# Patient Record
Sex: Female | Born: 1965
Health system: Southern US, Community
[De-identification: ages and names within clinical notes are randomized; demographics above are authoritative.]

## PROBLEM LIST (undated history)

## (undated) DIAGNOSIS — I471 Supraventricular tachycardia, unspecified: Secondary | ICD-10-CM

## (undated) DIAGNOSIS — R51 Headache: Secondary | ICD-10-CM

## (undated) DIAGNOSIS — K8050 Calculus of bile duct without cholangitis or cholecystitis without obstruction: Secondary | ICD-10-CM

## (undated) DIAGNOSIS — K449 Diaphragmatic hernia without obstruction or gangrene: Secondary | ICD-10-CM

## (undated) DIAGNOSIS — F419 Anxiety disorder, unspecified: Secondary | ICD-10-CM

## (undated) DIAGNOSIS — F329 Major depressive disorder, single episode, unspecified: Secondary | ICD-10-CM

## (undated) DIAGNOSIS — M722 Plantar fascial fibromatosis: Secondary | ICD-10-CM

## (undated) DIAGNOSIS — K222 Esophageal obstruction: Secondary | ICD-10-CM

## (undated) DIAGNOSIS — G4730 Sleep apnea, unspecified: Secondary | ICD-10-CM

## (undated) DIAGNOSIS — K8020 Calculus of gallbladder without cholecystitis without obstruction: Secondary | ICD-10-CM

## (undated) DIAGNOSIS — A048 Other specified bacterial intestinal infections: Secondary | ICD-10-CM

## (undated) DIAGNOSIS — G8929 Other chronic pain: Secondary | ICD-10-CM

## (undated) DIAGNOSIS — K2970 Gastritis, unspecified, without bleeding: Secondary | ICD-10-CM

## (undated) DIAGNOSIS — F32A Depression, unspecified: Secondary | ICD-10-CM

## (undated) DIAGNOSIS — K219 Gastro-esophageal reflux disease without esophagitis: Secondary | ICD-10-CM

## (undated) DIAGNOSIS — G43909 Migraine, unspecified, not intractable, without status migrainosus: Secondary | ICD-10-CM

## (undated) DIAGNOSIS — R519 Headache, unspecified: Secondary | ICD-10-CM

## (undated) HISTORY — DX: Gastro-esophageal reflux disease without esophagitis: K21.9

## (undated) HISTORY — DX: Sleep apnea, unspecified: G47.30

## (undated) HISTORY — DX: Plantar fascial fibromatosis: M72.2

## (undated) HISTORY — DX: Other specified bacterial intestinal infections: A04.8

## (undated) HISTORY — DX: Diaphragmatic hernia without obstruction or gangrene: K44.9

## (undated) HISTORY — DX: Headache, unspecified: R51.9

## (undated) HISTORY — PX: DILATION AND CURETTAGE OF UTERUS: SHX78

## (undated) HISTORY — DX: Headache: R51

## (undated) HISTORY — PX: TONSILLECTOMY: SUR1361

## (undated) HISTORY — DX: Calculus of bile duct without cholangitis or cholecystitis without obstruction: K80.50

## (undated) HISTORY — DX: Supraventricular tachycardia, unspecified: I47.10

## (undated) HISTORY — DX: Anxiety disorder, unspecified: F41.9

## (undated) HISTORY — DX: Gastritis, unspecified, without bleeding: K29.70

## (undated) HISTORY — DX: Other chronic pain: G89.29

## (undated) HISTORY — DX: Esophageal obstruction: K22.2

---

## 1997-06-01 ENCOUNTER — Ambulatory Visit (HOSPITAL_COMMUNITY): Admission: RE | Admit: 1997-06-01 | Discharge: 1997-06-01 | Payer: Self-pay | Admitting: Obstetrics & Gynecology

## 1997-08-12 ENCOUNTER — Encounter: Admission: RE | Admit: 1997-08-12 | Discharge: 1997-08-12 | Payer: Self-pay | Admitting: Family Medicine

## 1997-08-17 ENCOUNTER — Inpatient Hospital Stay (HOSPITAL_COMMUNITY): Admission: AD | Admit: 1997-08-17 | Discharge: 1997-08-17 | Payer: Self-pay | Admitting: Obstetrics

## 1997-08-27 ENCOUNTER — Encounter: Admission: RE | Admit: 1997-08-27 | Discharge: 1997-08-27 | Payer: Self-pay | Admitting: Family Medicine

## 1997-09-15 ENCOUNTER — Encounter: Admission: RE | Admit: 1997-09-15 | Discharge: 1997-09-15 | Payer: Self-pay | Admitting: Family Medicine

## 1997-10-16 ENCOUNTER — Ambulatory Visit (HOSPITAL_COMMUNITY): Admission: RE | Admit: 1997-10-16 | Discharge: 1997-10-16 | Payer: Self-pay | Admitting: Obstetrics

## 1997-10-20 ENCOUNTER — Encounter: Admission: RE | Admit: 1997-10-20 | Discharge: 1997-10-20 | Payer: Self-pay | Admitting: Sports Medicine

## 1997-11-05 ENCOUNTER — Inpatient Hospital Stay (HOSPITAL_COMMUNITY): Admission: AD | Admit: 1997-11-05 | Discharge: 1997-11-05 | Payer: Self-pay | Admitting: Obstetrics & Gynecology

## 1997-11-19 ENCOUNTER — Encounter: Admission: RE | Admit: 1997-11-19 | Discharge: 1997-11-19 | Payer: Self-pay | Admitting: Family Medicine

## 1997-12-21 ENCOUNTER — Encounter: Admission: RE | Admit: 1997-12-21 | Discharge: 1997-12-21 | Payer: Self-pay | Admitting: Family Medicine

## 1998-01-13 ENCOUNTER — Ambulatory Visit (HOSPITAL_COMMUNITY): Admission: RE | Admit: 1998-01-13 | Discharge: 1998-01-13 | Payer: Self-pay | Admitting: Obstetrics

## 1998-01-19 ENCOUNTER — Encounter: Admission: RE | Admit: 1998-01-19 | Discharge: 1998-01-19 | Payer: Self-pay | Admitting: Sports Medicine

## 1998-01-28 ENCOUNTER — Inpatient Hospital Stay (HOSPITAL_COMMUNITY): Admission: AD | Admit: 1998-01-28 | Discharge: 1998-01-28 | Payer: Self-pay | Admitting: Obstetrics & Gynecology

## 1998-02-03 ENCOUNTER — Encounter: Admission: RE | Admit: 1998-02-03 | Discharge: 1998-02-03 | Payer: Self-pay | Admitting: Family Medicine

## 1998-03-05 ENCOUNTER — Encounter: Admission: RE | Admit: 1998-03-05 | Discharge: 1998-03-05 | Payer: Self-pay | Admitting: Family Medicine

## 1998-03-29 ENCOUNTER — Encounter: Admission: RE | Admit: 1998-03-29 | Discharge: 1998-03-29 | Payer: Self-pay | Admitting: Sports Medicine

## 1998-03-30 ENCOUNTER — Inpatient Hospital Stay (HOSPITAL_COMMUNITY): Admission: RE | Admit: 1998-03-30 | Discharge: 1998-04-02 | Payer: Self-pay

## 1998-04-03 ENCOUNTER — Encounter (HOSPITAL_COMMUNITY): Admission: RE | Admit: 1998-04-03 | Discharge: 1998-07-02 | Payer: Self-pay | Admitting: Family Medicine

## 1998-04-05 ENCOUNTER — Encounter: Admission: RE | Admit: 1998-04-05 | Discharge: 1998-04-05 | Payer: Self-pay | Admitting: Sports Medicine

## 1998-04-07 ENCOUNTER — Encounter: Admission: RE | Admit: 1998-04-07 | Discharge: 1998-04-07 | Payer: Self-pay | Admitting: Family Medicine

## 2000-10-03 ENCOUNTER — Encounter: Payer: Self-pay | Admitting: Obstetrics

## 2000-10-03 ENCOUNTER — Inpatient Hospital Stay (HOSPITAL_COMMUNITY): Admission: AD | Admit: 2000-10-03 | Discharge: 2000-10-03 | Payer: Self-pay | Admitting: Obstetrics

## 2000-10-07 ENCOUNTER — Inpatient Hospital Stay (HOSPITAL_COMMUNITY): Admission: AD | Admit: 2000-10-07 | Discharge: 2000-10-07 | Payer: Self-pay | Admitting: Obstetrics & Gynecology

## 2000-10-10 ENCOUNTER — Inpatient Hospital Stay (HOSPITAL_COMMUNITY): Admission: AD | Admit: 2000-10-10 | Discharge: 2000-10-10 | Payer: Self-pay | Admitting: Obstetrics

## 2000-10-18 ENCOUNTER — Inpatient Hospital Stay (HOSPITAL_COMMUNITY): Admission: AD | Admit: 2000-10-18 | Discharge: 2000-10-18 | Payer: Self-pay | Admitting: Obstetrics & Gynecology

## 2000-10-23 ENCOUNTER — Inpatient Hospital Stay (HOSPITAL_COMMUNITY): Admission: AD | Admit: 2000-10-23 | Discharge: 2000-10-23 | Payer: Self-pay | Admitting: Obstetrics and Gynecology

## 2000-10-30 ENCOUNTER — Inpatient Hospital Stay (HOSPITAL_COMMUNITY): Admission: AD | Admit: 2000-10-30 | Discharge: 2000-10-30 | Payer: Self-pay | Admitting: Obstetrics and Gynecology

## 2001-01-28 ENCOUNTER — Encounter: Admission: RE | Admit: 2001-01-28 | Discharge: 2001-01-28 | Payer: Self-pay | Admitting: Family Medicine

## 2001-02-22 ENCOUNTER — Encounter: Admission: RE | Admit: 2001-02-22 | Discharge: 2001-02-22 | Payer: Self-pay | Admitting: Obstetrics & Gynecology

## 2001-02-25 ENCOUNTER — Encounter: Admission: RE | Admit: 2001-02-25 | Discharge: 2001-02-25 | Payer: Self-pay | Admitting: Family Medicine

## 2001-02-25 ENCOUNTER — Other Ambulatory Visit: Admission: RE | Admit: 2001-02-25 | Discharge: 2001-02-25 | Payer: Self-pay | Admitting: *Deleted

## 2001-03-14 ENCOUNTER — Encounter: Admission: RE | Admit: 2001-03-14 | Discharge: 2001-03-14 | Payer: Self-pay | Admitting: Family Medicine

## 2001-06-18 ENCOUNTER — Emergency Department (HOSPITAL_COMMUNITY): Admission: EM | Admit: 2001-06-18 | Discharge: 2001-06-18 | Payer: Self-pay

## 2001-07-19 ENCOUNTER — Ambulatory Visit (HOSPITAL_COMMUNITY): Admission: RE | Admit: 2001-07-19 | Discharge: 2001-07-19 | Payer: Self-pay | Admitting: Family Medicine

## 2001-07-19 ENCOUNTER — Encounter: Admission: RE | Admit: 2001-07-19 | Discharge: 2001-07-19 | Payer: Self-pay | Admitting: Family Medicine

## 2001-08-09 ENCOUNTER — Emergency Department (HOSPITAL_COMMUNITY): Admission: EM | Admit: 2001-08-09 | Discharge: 2001-08-09 | Payer: Self-pay | Admitting: Emergency Medicine

## 2001-08-09 ENCOUNTER — Encounter: Payer: Self-pay | Admitting: Emergency Medicine

## 2001-08-11 ENCOUNTER — Emergency Department (HOSPITAL_COMMUNITY): Admission: EM | Admit: 2001-08-11 | Discharge: 2001-08-11 | Payer: Self-pay | Admitting: Emergency Medicine

## 2001-08-20 ENCOUNTER — Ambulatory Visit (HOSPITAL_COMMUNITY): Admission: RE | Admit: 2001-08-20 | Discharge: 2001-08-20 | Payer: Self-pay | Admitting: Sports Medicine

## 2001-08-20 ENCOUNTER — Encounter: Admission: RE | Admit: 2001-08-20 | Discharge: 2001-08-20 | Payer: Self-pay | Admitting: Sports Medicine

## 2001-09-04 ENCOUNTER — Encounter: Admission: RE | Admit: 2001-09-04 | Discharge: 2001-09-04 | Payer: Self-pay | Admitting: Sports Medicine

## 2001-09-12 ENCOUNTER — Encounter: Admission: RE | Admit: 2001-09-12 | Discharge: 2001-09-12 | Payer: Self-pay | Admitting: Family Medicine

## 2001-09-18 ENCOUNTER — Encounter: Admission: RE | Admit: 2001-09-18 | Discharge: 2001-09-18 | Payer: Self-pay | Admitting: Family Medicine

## 2001-09-25 ENCOUNTER — Encounter: Admission: RE | Admit: 2001-09-25 | Discharge: 2001-09-25 | Payer: Self-pay | Admitting: Family Medicine

## 2001-09-26 ENCOUNTER — Encounter: Admission: RE | Admit: 2001-09-26 | Discharge: 2001-09-26 | Payer: Self-pay | Admitting: Family Medicine

## 2001-10-16 ENCOUNTER — Encounter: Admission: RE | Admit: 2001-10-16 | Discharge: 2001-10-16 | Payer: Self-pay | Admitting: Family Medicine

## 2001-11-04 ENCOUNTER — Encounter: Admission: RE | Admit: 2001-11-04 | Discharge: 2001-11-04 | Payer: Self-pay | Admitting: Family Medicine

## 2001-11-06 ENCOUNTER — Encounter: Admission: RE | Admit: 2001-11-06 | Discharge: 2001-11-06 | Payer: Self-pay | Admitting: Family Medicine

## 2001-11-13 ENCOUNTER — Encounter: Admission: RE | Admit: 2001-11-13 | Discharge: 2001-11-13 | Payer: Self-pay | Admitting: Family Medicine

## 2001-11-20 ENCOUNTER — Encounter: Admission: RE | Admit: 2001-11-20 | Discharge: 2001-11-20 | Payer: Self-pay | Admitting: Family Medicine

## 2001-12-19 ENCOUNTER — Encounter: Admission: RE | Admit: 2001-12-19 | Discharge: 2001-12-19 | Payer: Self-pay | Admitting: Family Medicine

## 2001-12-25 ENCOUNTER — Encounter: Admission: RE | Admit: 2001-12-25 | Discharge: 2001-12-25 | Payer: Self-pay | Admitting: Family Medicine

## 2002-03-13 ENCOUNTER — Encounter: Admission: RE | Admit: 2002-03-13 | Discharge: 2002-03-13 | Payer: Self-pay | Admitting: Family Medicine

## 2002-05-23 ENCOUNTER — Encounter: Admission: RE | Admit: 2002-05-23 | Discharge: 2002-05-23 | Payer: Self-pay | Admitting: Family Medicine

## 2002-06-05 ENCOUNTER — Encounter: Admission: RE | Admit: 2002-06-05 | Discharge: 2002-06-05 | Payer: Self-pay | Admitting: Family Medicine

## 2002-08-21 ENCOUNTER — Encounter: Admission: RE | Admit: 2002-08-21 | Discharge: 2002-08-21 | Payer: Self-pay | Admitting: Family Medicine

## 2002-08-28 ENCOUNTER — Encounter: Admission: RE | Admit: 2002-08-28 | Discharge: 2002-08-28 | Payer: Self-pay | Admitting: Family Medicine

## 2002-10-30 ENCOUNTER — Encounter: Admission: RE | Admit: 2002-10-30 | Discharge: 2002-10-30 | Payer: Self-pay | Admitting: Family Medicine

## 2002-11-20 ENCOUNTER — Encounter: Admission: RE | Admit: 2002-11-20 | Discharge: 2002-11-20 | Payer: Self-pay | Admitting: Family Medicine

## 2002-12-19 ENCOUNTER — Encounter: Admission: RE | Admit: 2002-12-19 | Discharge: 2002-12-19 | Payer: Self-pay | Admitting: Family Medicine

## 2003-01-26 ENCOUNTER — Encounter: Admission: RE | Admit: 2003-01-26 | Discharge: 2003-01-26 | Payer: Self-pay | Admitting: Sports Medicine

## 2003-02-13 ENCOUNTER — Encounter: Admission: RE | Admit: 2003-02-13 | Discharge: 2003-02-13 | Payer: Self-pay | Admitting: Sports Medicine

## 2003-05-07 ENCOUNTER — Encounter: Admission: RE | Admit: 2003-05-07 | Discharge: 2003-05-07 | Payer: Self-pay | Admitting: Family Medicine

## 2003-08-11 ENCOUNTER — Encounter: Admission: RE | Admit: 2003-08-11 | Discharge: 2003-08-11 | Payer: Self-pay | Admitting: Family Medicine

## 2003-10-30 ENCOUNTER — Ambulatory Visit: Payer: Self-pay | Admitting: Family Medicine

## 2003-11-18 ENCOUNTER — Ambulatory Visit: Payer: Self-pay | Admitting: Family Medicine

## 2003-11-23 ENCOUNTER — Ambulatory Visit: Payer: Self-pay | Admitting: Sports Medicine

## 2003-11-25 ENCOUNTER — Encounter: Admission: RE | Admit: 2003-11-25 | Discharge: 2004-02-23 | Payer: Self-pay | Admitting: Family Medicine

## 2003-12-25 ENCOUNTER — Ambulatory Visit: Payer: Self-pay | Admitting: Family Medicine

## 2004-01-20 ENCOUNTER — Ambulatory Visit: Payer: Self-pay | Admitting: Family Medicine

## 2004-04-08 ENCOUNTER — Ambulatory Visit: Payer: Self-pay | Admitting: Family Medicine

## 2004-07-01 ENCOUNTER — Ambulatory Visit: Payer: Self-pay | Admitting: Family Medicine

## 2004-09-20 ENCOUNTER — Ambulatory Visit: Payer: Self-pay | Admitting: Family Medicine

## 2004-11-17 ENCOUNTER — Ambulatory Visit: Payer: Self-pay | Admitting: Family Medicine

## 2004-12-02 ENCOUNTER — Ambulatory Visit: Payer: Self-pay | Admitting: Family Medicine

## 2005-02-28 ENCOUNTER — Ambulatory Visit: Payer: Self-pay | Admitting: Family Medicine

## 2005-03-05 ENCOUNTER — Encounter (INDEPENDENT_AMBULATORY_CARE_PROVIDER_SITE_OTHER): Payer: Self-pay | Admitting: *Deleted

## 2005-03-05 LAB — CONVERTED CEMR LAB

## 2005-03-08 ENCOUNTER — Ambulatory Visit: Payer: Self-pay | Admitting: Family Medicine

## 2005-03-08 ENCOUNTER — Other Ambulatory Visit: Admission: RE | Admit: 2005-03-08 | Discharge: 2005-03-08 | Payer: Self-pay | Admitting: Family Medicine

## 2005-03-15 ENCOUNTER — Ambulatory Visit: Payer: Self-pay | Admitting: Family Medicine

## 2005-03-17 ENCOUNTER — Ambulatory Visit: Payer: Self-pay | Admitting: Family Medicine

## 2005-05-18 ENCOUNTER — Ambulatory Visit: Payer: Self-pay | Admitting: Family Medicine

## 2005-08-04 ENCOUNTER — Ambulatory Visit: Payer: Self-pay | Admitting: Family Medicine

## 2006-04-26 DIAGNOSIS — R351 Nocturia: Secondary | ICD-10-CM | POA: Insufficient documentation

## 2006-04-27 ENCOUNTER — Encounter (INDEPENDENT_AMBULATORY_CARE_PROVIDER_SITE_OTHER): Payer: Self-pay | Admitting: *Deleted

## 2006-05-06 ENCOUNTER — Emergency Department (HOSPITAL_COMMUNITY): Admission: EM | Admit: 2006-05-06 | Discharge: 2006-05-06 | Payer: Self-pay | Admitting: Emergency Medicine

## 2006-07-02 ENCOUNTER — Ambulatory Visit: Payer: Self-pay | Admitting: Family Medicine

## 2006-07-02 DIAGNOSIS — N920 Excessive and frequent menstruation with regular cycle: Secondary | ICD-10-CM | POA: Insufficient documentation

## 2006-07-02 LAB — CONVERTED CEMR LAB: Beta hcg, urine, semiquantitative: NEGATIVE

## 2006-07-24 ENCOUNTER — Encounter: Payer: Self-pay | Admitting: Family Medicine

## 2006-07-24 ENCOUNTER — Ambulatory Visit: Payer: Self-pay | Admitting: Family Medicine

## 2006-07-24 LAB — CONVERTED CEMR LAB: Beta hcg, urine, semiquantitative: NEGATIVE

## 2006-07-27 ENCOUNTER — Encounter: Payer: Self-pay | Admitting: Family Medicine

## 2006-08-22 ENCOUNTER — Encounter: Payer: Self-pay | Admitting: *Deleted

## 2007-02-25 ENCOUNTER — Emergency Department (HOSPITAL_COMMUNITY): Admission: EM | Admit: 2007-02-25 | Discharge: 2007-02-25 | Payer: Self-pay | Admitting: Emergency Medicine

## 2007-03-21 ENCOUNTER — Ambulatory Visit: Payer: Self-pay | Admitting: Obstetrics and Gynecology

## 2007-03-26 ENCOUNTER — Ambulatory Visit (HOSPITAL_COMMUNITY): Admission: RE | Admit: 2007-03-26 | Discharge: 2007-03-26 | Payer: Self-pay | Admitting: Obstetrics & Gynecology

## 2007-04-03 ENCOUNTER — Ambulatory Visit (HOSPITAL_COMMUNITY): Admission: RE | Admit: 2007-04-03 | Discharge: 2007-04-03 | Payer: Self-pay | Admitting: Obstetrics and Gynecology

## 2007-04-03 ENCOUNTER — Ambulatory Visit: Payer: Self-pay | Admitting: Obstetrics and Gynecology

## 2007-04-05 ENCOUNTER — Ambulatory Visit: Payer: Self-pay | Admitting: Obstetrics & Gynecology

## 2007-04-17 ENCOUNTER — Ambulatory Visit: Payer: Self-pay | Admitting: Internal Medicine

## 2007-04-17 DIAGNOSIS — R059 Cough, unspecified: Secondary | ICD-10-CM | POA: Insufficient documentation

## 2007-04-17 DIAGNOSIS — J984 Other disorders of lung: Secondary | ICD-10-CM | POA: Insufficient documentation

## 2007-04-17 DIAGNOSIS — R05 Cough: Secondary | ICD-10-CM

## 2007-04-17 DIAGNOSIS — R0789 Other chest pain: Secondary | ICD-10-CM | POA: Insufficient documentation

## 2007-04-22 ENCOUNTER — Ambulatory Visit: Payer: Self-pay | Admitting: Internal Medicine

## 2007-05-03 ENCOUNTER — Ambulatory Visit: Payer: Self-pay

## 2007-05-03 ENCOUNTER — Encounter: Payer: Self-pay | Admitting: Cardiovascular Disease

## 2007-05-03 ENCOUNTER — Ambulatory Visit: Payer: Self-pay | Admitting: Cardiovascular Disease

## 2007-05-03 ENCOUNTER — Ambulatory Visit (HOSPITAL_COMMUNITY): Admission: RE | Admit: 2007-05-03 | Discharge: 2007-05-03 | Payer: Self-pay | Admitting: Obstetrics & Gynecology

## 2007-05-09 ENCOUNTER — Ambulatory Visit: Payer: Self-pay | Admitting: Professional

## 2007-05-17 ENCOUNTER — Ambulatory Visit: Payer: Self-pay | Admitting: Obstetrics & Gynecology

## 2007-05-20 ENCOUNTER — Ambulatory Visit: Payer: Self-pay | Admitting: Internal Medicine

## 2007-06-06 ENCOUNTER — Ambulatory Visit: Payer: Self-pay | Admitting: Obstetrics and Gynecology

## 2007-08-22 ENCOUNTER — Encounter (INDEPENDENT_AMBULATORY_CARE_PROVIDER_SITE_OTHER): Payer: Self-pay | Admitting: Gynecology

## 2007-08-22 ENCOUNTER — Ambulatory Visit: Payer: Self-pay | Admitting: Gynecology

## 2007-09-03 ENCOUNTER — Ambulatory Visit (HOSPITAL_COMMUNITY): Admission: RE | Admit: 2007-09-03 | Discharge: 2007-09-03 | Payer: Self-pay | Admitting: Gynecology

## 2007-11-07 ENCOUNTER — Ambulatory Visit: Payer: Self-pay | Admitting: Obstetrics and Gynecology

## 2007-11-13 ENCOUNTER — Telehealth: Payer: Self-pay | Admitting: *Deleted

## 2007-11-15 ENCOUNTER — Ambulatory Visit: Payer: Self-pay | Admitting: Family Medicine

## 2007-12-17 ENCOUNTER — Encounter (INDEPENDENT_AMBULATORY_CARE_PROVIDER_SITE_OTHER): Payer: Self-pay | Admitting: *Deleted

## 2008-01-28 ENCOUNTER — Ambulatory Visit: Payer: Self-pay | Admitting: Obstetrics & Gynecology

## 2008-04-28 ENCOUNTER — Ambulatory Visit: Payer: Self-pay | Admitting: Internal Medicine

## 2008-04-29 ENCOUNTER — Ambulatory Visit: Payer: Self-pay | Admitting: Internal Medicine

## 2008-06-09 ENCOUNTER — Ambulatory Visit: Payer: Self-pay | Admitting: Internal Medicine

## 2008-06-15 ENCOUNTER — Ambulatory Visit: Payer: Self-pay | Admitting: Family Medicine

## 2008-06-15 ENCOUNTER — Encounter: Payer: Self-pay | Admitting: Family Medicine

## 2008-06-15 DIAGNOSIS — R209 Unspecified disturbances of skin sensation: Secondary | ICD-10-CM | POA: Insufficient documentation

## 2008-06-15 DIAGNOSIS — F321 Major depressive disorder, single episode, moderate: Secondary | ICD-10-CM | POA: Insufficient documentation

## 2008-06-16 ENCOUNTER — Encounter: Payer: Self-pay | Admitting: Family Medicine

## 2008-06-22 ENCOUNTER — Ambulatory Visit: Payer: Self-pay | Admitting: Family Medicine

## 2008-06-22 ENCOUNTER — Telehealth: Payer: Self-pay | Admitting: Family Medicine

## 2008-06-22 DIAGNOSIS — J309 Allergic rhinitis, unspecified: Secondary | ICD-10-CM | POA: Insufficient documentation

## 2008-06-22 DIAGNOSIS — G43909 Migraine, unspecified, not intractable, without status migrainosus: Secondary | ICD-10-CM | POA: Insufficient documentation

## 2008-07-08 ENCOUNTER — Telehealth: Payer: Self-pay | Admitting: Family Medicine

## 2008-07-11 ENCOUNTER — Emergency Department (HOSPITAL_COMMUNITY): Admission: EM | Admit: 2008-07-11 | Discharge: 2008-07-11 | Payer: Self-pay | Admitting: Family Medicine

## 2008-07-14 ENCOUNTER — Telehealth: Payer: Self-pay | Admitting: Family Medicine

## 2008-09-07 ENCOUNTER — Ambulatory Visit (HOSPITAL_COMMUNITY): Admission: RE | Admit: 2008-09-07 | Discharge: 2008-09-07 | Payer: Self-pay | Admitting: Obstetrics & Gynecology

## 2009-02-24 ENCOUNTER — Ambulatory Visit: Payer: Self-pay | Admitting: Family Medicine

## 2009-02-24 ENCOUNTER — Telehealth: Payer: Self-pay | Admitting: Family Medicine

## 2009-05-19 ENCOUNTER — Ambulatory Visit: Payer: Self-pay | Admitting: Family Medicine

## 2009-05-19 DIAGNOSIS — N644 Mastodynia: Secondary | ICD-10-CM | POA: Insufficient documentation

## 2009-05-19 DIAGNOSIS — S139XXA Sprain of joints and ligaments of unspecified parts of neck, initial encounter: Secondary | ICD-10-CM | POA: Insufficient documentation

## 2009-06-11 ENCOUNTER — Ambulatory Visit: Payer: Self-pay | Admitting: Internal Medicine

## 2009-08-01 ENCOUNTER — Emergency Department (HOSPITAL_COMMUNITY): Admission: EM | Admit: 2009-08-01 | Discharge: 2009-08-01 | Payer: Self-pay | Admitting: Emergency Medicine

## 2009-08-08 ENCOUNTER — Emergency Department (HOSPITAL_COMMUNITY): Admission: EM | Admit: 2009-08-08 | Discharge: 2009-08-08 | Payer: Self-pay | Admitting: Emergency Medicine

## 2009-10-05 ENCOUNTER — Encounter: Payer: Self-pay | Admitting: Sports Medicine

## 2009-10-05 ENCOUNTER — Ambulatory Visit: Payer: Self-pay | Admitting: Family Medicine

## 2009-10-21 ENCOUNTER — Ambulatory Visit: Payer: Self-pay | Admitting: Obstetrics and Gynecology

## 2009-10-21 ENCOUNTER — Ambulatory Visit (HOSPITAL_COMMUNITY): Admission: RE | Admit: 2009-10-21 | Discharge: 2009-10-21 | Payer: Self-pay | Admitting: Family Medicine

## 2010-03-20 ENCOUNTER — Encounter: Payer: Self-pay | Admitting: Internal Medicine

## 2010-03-29 NOTE — Assessment & Plan Note (Signed)
Summary: eye infection,tcb   Vital Signs:  Patient profile:   46 year old female Height:      62 inches Weight:      154 pounds BMI:     28.27 Temp:     98.4 degrees F oral BP sitting:   122 / 72  (right arm) Cuff size:   regular  Vitals Entered By: Jimmy Footman, CMA (October 05, 2009 10:33 AM) CC: rt eye infection started this morning Is Patient Diabetic? No Pain Assessment Patient in pain? yes     Location: eye Intensity: 3 Type: stinging   Primary Care Provider:  [facility]  Teaching [facility]  CC:  rt eye infection started this morning.  History of Present Illness: 45 yo contact wearer.  Slept in contacts last night, woke up with red irritated eyes.  Took out lenses but thinks she may have scratched her eye.  No gritty/sandpaper sensation.  Eyes just red and irritated.  Vision normal.  Wearing her glasses.  No drainage other than tears when she rubs eyes.  No fevers/chills.  No ear pain.    Current Medications (verified): 1)  Wellbutrin Sr 150 Mg Xr12h-Tab (Bupropion Hcl) .... Take 1 Tablet By Mouth Once A Day 2)  Amitriptyline Hcl 50 Mg Tabs (Amitriptyline Hcl) .... Take 1/2 Tab By Mouth At Bedtime For 3 Days; Then Increase To 1 Tab By Mouth At Bedtime For Headache Prevention 3)  Imitrex 50 Mg Tabs (Sumatriptan Succinate) .Marland Kitchen.. 1 Tab By Mouth As Needed Headache.  May Repeat Once After 2 Hours If Headache Recurs 4)  Loratadine 10 Mg Tabs (Loratadine) .Marland Kitchen.. 1 Tab By Mouth Daily For Allergies 5)  Flexeril 10 Mg Tabs (Cyclobenzaprine Hcl) .Marland Kitchen.. 1 By Mouth Three Times A Day As Needed 6)  Genteal 0.3 % Soln (Hypromellose) .Marland Kitchen.. 1-2 Drops Q4h in Eye As Needed For Irritation  Allergies (verified): 1)  ! Percocet  Review of Systems       See HPI  Physical Exam  General:  Well-developed,well-nourished,in no acute distress; alert,appropriate and cooperative throughout examination Head:  Normocephalic and atraumatic without obvious abnormalities. No apparent alopecia  or balding. Eyes:  Conjunctiva injected on right, vision grossly normal, tearing but no purulence.  EOMI.  Fluorescein examination unremarkable, no corneal abrasions or ulcerations seen. Ears:  External ear exam shows no significant lesions or deformities.  Neck:  No deformities, masses, or tenderness noted.   Impression & Recommendations:  Problem # 1:  UNSPECIFIED CONJUNCTIVITIS (ICD-372.30) Assessment New Irritative conjunctivitis.  Pt desires rx eyedrops, no need for abx so will rx genteal eye drops as needed.  Advised keep contacts off for 5 d or until irritation resolved.  Wear glasses until then.  If worsening in 5d or purulent drainage then RTC.  Pt does not desire any rx for pain.  The following medications were removed from the medication list:    Ciloxan 0.3 % Soln (Ciprofloxacin hcl) .Marland Kitchen... 1 drop in eye every 2 hours while awake for 2 days then every 4 hours while awake for 5 days disp: 1 bottle (smallest size sufficient)  Orders: [facility]- Est Level  3 (69629)  Complete Medication List: 1)  Wellbutrin Sr 150 Mg Xr12h-tab (Bupropion hcl) .... Take 1 tablet by mouth once a day 2)  Amitriptyline Hcl 50 Mg Tabs (Amitriptyline hcl) .... Take 1/2 tab by mouth at bedtime for 3 days; then increase to 1 tab by mouth at bedtime for headache prevention 3)  Imitrex 50 Mg  Tabs (Sumatriptan succinate) .Marland Kitchen.. 1 tab by mouth as needed headache.  may repeat once after 2 hours if headache recurs 4)  Loratadine 10 Mg Tabs (Loratadine) .Marland Kitchen.. 1 tab by mouth daily for allergies 5)  Flexeril 10 Mg Tabs (Cyclobenzaprine hcl) .Marland Kitchen.. 1 by mouth three times a day as needed 6)  Genteal 0.3 % Soln (Hypromellose) .Marland Kitchen.. 1-2 drops q4h in eye as needed for irritation  Patient Instructions: 1)  There are no abrasions/scratches on your eye. 2)  Keep your contacts off for  ~5 days.  Keep eyes well lubricated. 3)  Come back to see me if not getting better in 5d. 4)  -Dr. Karie Schwalbe. Prescriptions: GENTEAL 0.3 % SOLN  (HYPROMELLOSE) 1-2 drops q4h in eye as needed for irritation  #1 bottle x 0   Entered and Authorized by:   Rodney Langton MD   Signed by:   Rodney Langton MD on 10/05/2009   Method used:   Print then Give to Patient   RxID:   [id number]

## 2010-03-29 NOTE — Letter (Signed)
Summary: Out of Work  [facility]  [address]   [place], [place] [address]   Phone: [phone]  Fax: [phone]    October 05, 2009   Employee:  [name]    To Whom It May Concern:   For Medical reasons, please excuse the above named employee from work for the following dates:  Start:   10/05/09  End:   10/05/09  If you need additional information, please feel free to contact our office.         Sincerely,    Rodney Langton MD
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
## 2010-07-12 NOTE — Procedures (Signed)
[facility]                              EXERCISE TREADMILL   NAME:[name], [name]                        MRN:          [id number]  DATE:05/03/2007                            DOB:          [birth year]    HISTORY:  A 45 year old patient with family history of coronary artery  disease, chest pain, rule out coronary artery disease.   The patient exercised for 7 minutes on standard Bruce protocol exercise  and stopped due to fatigue.  Maximum heart rate is 166.  Peak blood  pressure is 141/88.  Resting EKG showed right atrial enlargement with  stress.  There was no ischemia.   IMPRESSION:  Negative exercise stress test at a reasonable work load.  Apparently the patient has had over a 100 pound weight loss and is  having dyspnea as well.  She was referred for followup echocardiogram  and then will see Dr. Marchelle Gearing back.  There is no evidence of premature  coronary artery disease.     Noralyn Pick. Eden Emms, MD, [facility]  Electronically Signed    PCN/MedQ  DD: 05/03/2007  DT: 05/05/2007  Job #: [id number]   cc:   Kalman Shan, MD
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
## 2010-07-12 NOTE — Group Therapy Note (Signed)
NAMEMarland Kitchen  [name], [name] NO.:  [id number]   MEDICAL RECORD NO.:  [id number]          PATIENT TYPE:  WOC   LOCATION[facility]                   FACILITY:  [facility]   PHYSICIAN:  Ginger Carne, MD DATE OF BIRTH:  December 16, 1965   DATE OF SERVICE:  08/22/2007                                  CLINIC NOTE   Patient is here today for routine gynecologic evaluation.  She has no  specific complaints.  She has an occasional scant vaginal flow.  She is  on Depo Provera 150 mg daily.  Otherwise, the patient was recently  evaluated in March 2009 for lung nodules.  CT scan had been performed  through [facility] and at that time they had decided to sit  tight.  The impression after their evaluation of the patient was to  repeat a chest x-ray in 9 months.  She has also had a cardiac stress  test and I do not have a report of same.   Remainder of her medical history is recorded in the chart.   PHYSICAL EXAMINATION:  VITAL SIGNS:  Blood pressure 110/72, weight 92  pounds, height 62 inches, pulse 88 and regular.  Temperature is 100 but  patient has no symptoms related to the flu or upper respiratory tract  infection.  HEENT:  Grossly normal.  BREASTS:  Without masses, discharge, thickenings or tenderness.  CHEST:  Clear to percussion and auscultation.  CARDIOVASCULAR:  Without murmurs or enlargements, regular rate and  rhythm.  EXTREMITY, LYMPHATIC, SKIN, NEUROLOGICAL, MUSCULOSKELETAL SYSTEMS:  Within normal limits.  ABDOMEN:  Soft without gross hepatosplenomegaly.  PELVIC:  Pap performed.  External genitalia, vulva and vagina normal.  Cervix smooth without erosions or lesions.  Uterus is small, anteverted  and flexed.  Both adnexa palpable, found to be normal.   IMPRESSION:  Normal gynecologic exam.   PLAN:  The patient will receive Depo Provera 150 mg IM today and repeat  every 3 months and also will undergo a screening mammogram.  She was  advised if she does  develop upper respiratory infection symptomatology,  to go to the emergency department at [facility]. [facility] or  [facility].           ______________________________  Ginger Carne, MD     SHB/MEDQ  D:  08/22/2007  T:  08/22/2007  Job:  [id number]

## 2010-07-12 NOTE — Group Therapy Note (Signed)
NAMEMarland Kitchen  MAHROSH, DONNELL NO.:  [id number]   MEDICAL RECORD NO.:  [id number]          PATIENT TYPE:  WOC   [facility]ATION[facility]                   [facility]   PHYSICIAN:  Elsie Lincoln, MD      DATE OF BIRTH:  Jul 16, 1965   DATE OF SERVICE:  04/05/2007                                  CLINIC NOTE   The patient is a 45 year old female who is here for results.  The  patient has chronic nausea and weight loss and was sent to [place] for  evaluation of weight loss and chronic nausea and also some  oligomenorrhea.  The patient is very thin and did have an FSH, TSH, LH  drawn which were normal.  The patient does not appear to be menopausal.  FSH was 9, her TSH showed her to be euthymic.  Her hemoglobin is 15.4.  The patient had transvaginal ultrasound which showed a small simple left  ovarian cyst that needs to be followed up in 6 weeks.  The patient  already has an appointment in March.  It also did show some lung nodules  in the bases so we  repeated a chest CT which showed pulmonary nodules  in the right lung base and lingular subpleural nodule measuring 4.8 mm.  An additional tiny left lung nodule measuring 2.1 mm.  Given that the  patient has lung nodules, weight loss and nausea, I am going to send her  to a pulmonologist so he can evaluate this and do the correct follow-up.  We will go ahead and see her back after her ultrasound to make sure that  the ovarian cyst has resolved.  She has also been started on Depo to  protect her endometrium because she has oligomenorrhea.  She is due for  that shot 12 weeks after her last shot.  We will see her again in March  after the ultrasound.           ______________________________  Elsie Lincoln, MD     KL/MEDQ  D:  04/05/2007  T:  04/06/2007  Job:  [id number]

## 2010-11-17 LAB — POCT URINALYSIS DIP (DEVICE)
Nitrite: NEGATIVE
Specific Gravity, Urine: <=1.005
Urobilinogen, UA: 0.2
pH: 5

## 2010-12-02 LAB — POCT PREGNANCY, URINE: Operator id: 116391

## 2011-02-26 ENCOUNTER — Encounter: Payer: Self-pay | Admitting: Emergency Medicine

## 2011-02-26 ENCOUNTER — Emergency Department (HOSPITAL_COMMUNITY): Payer: Self-pay

## 2011-02-26 ENCOUNTER — Emergency Department (HOSPITAL_COMMUNITY)
Admission: EM | Admit: 2011-02-26 | Discharge: 2011-02-27 | Disposition: A | Discharge status: Home / Self Care | Payer: Self-pay | Attending: Emergency Medicine | Admitting: Emergency Medicine

## 2011-02-26 DIAGNOSIS — R1011 Right upper quadrant pain: Secondary | ICD-10-CM | POA: Insufficient documentation

## 2011-02-26 DIAGNOSIS — K8020 Calculus of gallbladder without cholecystitis without obstruction: Secondary | ICD-10-CM | POA: Insufficient documentation

## 2011-02-26 HISTORY — DX: Depression, unspecified: F32.A

## 2011-02-26 HISTORY — DX: Major depressive disorder, single episode, unspecified: F32.9

## 2011-02-26 LAB — URINALYSIS, ROUTINE W REFLEX MICROSCOPIC
Ketones, ur: NEGATIVE mg/dL
Leukocytes, UA: NEGATIVE
Nitrite: NEGATIVE
Protein, ur: NEGATIVE mg/dL
Urobilinogen, UA: 0.2 mg/dL (ref 0.0–1.0)

## 2011-02-26 LAB — URINE MICROSCOPIC-ADD ON

## 2011-02-26 MED ORDER — SODIUM CHLORIDE 0.9 % IV BOLUS (SEPSIS)
1000.0000 mL | Freq: Once | INTRAVENOUS | Status: AC
  Administered 2011-02-26: 1000 mL via INTRAVENOUS

## 2011-02-26 MED ORDER — ONDANSETRON HCL 4 MG/2ML IJ SOLN
4.0000 mg | Freq: Once | INTRAMUSCULAR | Status: AC
  Administered 2011-02-26: 4 mg via INTRAVENOUS
  Filled 2011-02-26: qty 2

## 2011-02-26 MED ORDER — MORPHINE SULFATE 4 MG/ML IJ SOLN
4.0000 mg | Freq: Once | INTRAMUSCULAR | Status: AC
  Administered 2011-02-26: 4 mg via INTRAVENOUS
  Filled 2011-02-26: qty 1

## 2011-02-26 NOTE — ED Provider Notes (Signed)
History     CSN: [id number]  Arrival date & time 02/26/11  1959   First MD Initiated Contact with Patient 02/26/11 2149      Chief Complaint  Patient presents with  . Flank Pain  . Abdominal Pain  . Nausea     HPI  History provided by the patient patient is a 45 year old female with history of depression who presents with complaints of right upper quadrant nominal pain. Patient reports having similar pains off and on for the past 3-4 months. She reports pain that usually lasts up to an hour at a time and then resolves. Over the past 3 days patient reports having persistent goal right upper quadrant pain radiating around to her back. Around 3 PM today pain began to gradually increase and become much worse. He continues to be persistent. It is associated with nausea. She denies any vomiting, diarrhea, or constipation. She has no fever, chills, sweats. She denies any other significant past medical history. Patient has no history of abdominal surgery.    Past Medical History  Diagnosis Date  . Depression     Past Surgical History  Procedure Date  . Dilation and curettage of uterus   . Tonsillectomy     History reviewed. No pertinent family history.  History  Substance Use Topics  . Smoking status: Not on file  . Smokeless tobacco: Not on file  . Alcohol Use: Yes    OB History    Grav Para Term Preterm Abortions TAB SAB Ect Mult Living                  Review of Systems  Constitutional: Negative for fever and chills.  Respiratory: Negative for cough.   Cardiovascular: Negative for chest pain.  Gastrointestinal: Positive for nausea and abdominal pain. Negative for vomiting, diarrhea and constipation.  All other systems reviewed and are negative.    Allergies  Oxycodone-acetaminophen  Home Medications  No current outpatient prescriptions on file.  BP 136/107  Pulse 90  Temp(Src) 98.9 F (37.2 C) (Oral)  Resp 20  Wt 110 lb 14.3 oz (50.3 kg)  SpO2  100%  Physical Exam  Nursing note and vitals reviewed. Constitutional: She is oriented to person, place, and time. She appears well-developed and well-nourished. No distress.  HENT:  Head: Normocephalic.  Mouth/Throat: Oropharynx is clear and moist.  Neck: Neck supple.  Cardiovascular: Normal rate and regular rhythm.   Pulmonary/Chest: Effort normal and breath sounds normal.  Abdominal: Soft. She exhibits no distension. There is tenderness in the right upper quadrant. There is no rebound, no guarding, no CVA tenderness, no tenderness at McBurney's point and negative Murphy's sign.       Obese  Neurological: She is alert and oriented to person, place, and time.  Skin: Skin is warm and dry. No rash noted.  Psychiatric: She has a normal mood and affect. Her behavior is normal.    ED Course  Procedures (including critical care time)   Labs Reviewed  CBC  DIFFERENTIAL  COMPREHENSIVE METABOLIC PANEL  LIPASE, BLOOD  URINALYSIS, ROUTINE W REFLEX MICROSCOPIC  POCT PREGNANCY, URINE   Results for orders placed during the hospital encounter of 02/26/11  CBC      Component Value Range   WBC 10.8 (*) 4.0 - 10.5 (K/uL)   RBC 4.41  3.87 - 5.11 (MIL/uL)   Hemoglobin 14.3  12.0 - 15.0 (g/dL)   HCT 53.6  64.4 - 03.4 (%)   MCV 95.0  78.0 -  100.0 (fL)   MCH 32.4  26.0 - 34.0 (pg)   MCHC 34.1  30.0 - 36.0 (g/dL)   RDW 40.9  81.1 - 91.4 (%)   Platelets 226  150 - 400 (K/uL)  DIFFERENTIAL      Component Value Range   Neutrophils Relative 57  43 - 77 (%)   Neutro Abs 6.2  1.7 - 7.7 (K/uL)   Lymphocytes Relative 33  12 - 46 (%)   Lymphs Abs 3.5  0.7 - 4.0 (K/uL)   Monocytes Relative 8  3 - 12 (%)   Monocytes Absolute 0.9  0.1 - 1.0 (K/uL)   Eosinophils Relative 2  0 - 5 (%)   Eosinophils Absolute 0.2  0.0 - 0.7 (K/uL)   Basophils Relative 1  0 - 1 (%)   Basophils Absolute 0.1  0.0 - 0.1 (K/uL)  COMPREHENSIVE METABOLIC PANEL      Component Value Range   Sodium 138  135 - 145 (mEq/L)    Potassium 4.0  3.5 - 5.1 (mEq/L)   Chloride 104  96 - 112 (mEq/L)   CO2 27  19 - 32 (mEq/L)   Glucose, Bld 102 (*) 70 - 99 (mg/dL)   BUN 11  6 - 23 (mg/dL)   Creatinine, Ser 7.82  0.50 - 1.10 (mg/dL)   Calcium 95.6  8.4 - 10.5 (mg/dL)   Total Protein 7.1  6.0 - 8.3 (g/dL)   Albumin 4.0  3.5 - 5.2 (g/dL)   AST 17  0 - 37 (U/L)   ALT 16  0 - 35 (U/L)   Alkaline Phosphatase 53  39 - 117 (U/L)   Total Bilirubin 0.3  0.3 - 1.2 (mg/dL)   GFR calc non Af Amer >90  >90 (mL/min)   GFR calc Af Amer >90  >90 (mL/min)  LIPASE, BLOOD      Component Value Range   Lipase 38  11 - 59 (U/L)  URINALYSIS, ROUTINE W REFLEX MICROSCOPIC      Component Value Range   Color, Urine YELLOW  YELLOW    APPearance CLOUDY (*) CLEAR    Specific Gravity, Urine 1.026  1.005 - 1.030    pH 5.5  5.0 - 8.0    Glucose, UA NEGATIVE  NEGATIVE (mg/dL)   Hgb urine dipstick MODERATE (*) NEGATIVE    Bilirubin Urine NEGATIVE  NEGATIVE    Ketones, ur NEGATIVE  NEGATIVE (mg/dL)   Protein, ur NEGATIVE  NEGATIVE (mg/dL)   Urobilinogen, UA 0.2  0.0 - 1.0 (mg/dL)   Nitrite NEGATIVE  NEGATIVE    Leukocytes, UA NEGATIVE  NEGATIVE   URINE MICROSCOPIC-ADD ON      Component Value Range   Squamous Epithelial / LPF FEW (*) RARE    WBC, UA 0-2  <3 (WBC/hpf)   RBC / HPF 0-2  <3 (RBC/hpf)   Bacteria, UA FEW (*) RARE    Urine-Other MUCOUS PRESENT        US Abdomen Complete  02/27/2011  *RADIOLOGY REPORT*  Clinical Data:  Right upper quadrant abdominal pain.  COMPLETE ABDOMINAL ULTRASOUND  Comparison:  None.  Findings:  Gallbladder:  Mobile stones in the dependent portion of the gallbladder measuring up to about 1 cm diameter.  Mild gallbladder sludge.  No gallbladder wall thickening.  Negative Murphy's sign.  Common bile duct:  Extrahepatic bile ducts are upper limits of normal size, measuring 8 mm diameter.  Liver:  No focal lesion identified.  Within normal limits in parenchymal echogenicity.  IVC:  Appears normal.  Pancreas:   Limited visualization due to overlying bowel gas.  Spleen:  Spleen length measures 8.6 cm.  Normal homogeneous parenchymal pattern.  Right Kidney:  Right kidney measures 10.6 cm length.  No hydronephrosis.  Left Kidney:  Left kidney measures 11.5 cm length.  No hydronephrosis.  Abdominal aorta:  No aneurysm identified.  IMPRESSION: Cholelithiasis and gallbladder sludge.  Extrahepatic bile ducts and upper limits of normal diameter.  Original Report Authenticated By: Marlon Pel, M.D.     1. Cholelithiasis       MDM  11:00 PM patient seen and evaluated. Patient in no acute distress.  12:30 PM patient reports pain has subsided after pain medications. She denies any nausea at this time. Reexam of abdomen is soft with very mild right upper quadrant epigastric tenderness. Negative Murphy's sign   Patient continues to be pain-free. Her lab tests are unremarkable. Her ultrasound shows signs for gallstones without any concerning secondary findings. There are no signs for cholecystitis. There is no signs for obstructing ductal stone. This time patient states she feels good to return home I plan to give her general surgery followup and symptomatic treatment with prescription.   Angus Seller, [place] 02/27/11 [phone]  Medical screening examination/treatment/procedure(s) were performed by non-physician practitioner and as supervising physician I was immediately available for consultation/collaboration.   Sunnie Nielsen, MD 02/27/11 [phone]

## 2011-02-26 NOTE — ED Notes (Signed)
Pt presented to the ER with c/o RUQ pain that radiated to the right back.fklank side, pt states that s/s started couple of months ago, states that pain was tolerable and on and off, however, today at work pain increased and at this time is severe and pt not able to control nor tolerate, pt further explains that also pain increased upon inhalation and movement or when palpating.

## 2011-02-27 LAB — CBC
HCT: 41.9 % (ref 36.0–46.0)
Hemoglobin: 14.3 g/dL (ref 12.0–15.0)
MCHC: 34.1 g/dL (ref 30.0–36.0)
MCV: 95 fL (ref 78.0–100.0)
RDW: 12.4 % (ref 11.5–15.5)

## 2011-02-27 LAB — COMPREHENSIVE METABOLIC PANEL
AST: 17 U/L (ref 0–37)
BUN: 11 mg/dL (ref 6–23)
CO2: 27 mEq/L (ref 19–32)
Calcium: 10.5 mg/dL (ref 8.4–10.5)
Chloride: 104 mEq/L (ref 96–112)
Creatinine, Ser: 0.67 mg/dL (ref 0.50–1.10)
GFR calc Af Amer: >90 mL/min (ref >90)
GFR calc non Af Amer: >90 mL/min (ref >90)
Total Bilirubin: 0.3 mg/dL (ref 0.3–1.2)

## 2011-02-27 LAB — DIFFERENTIAL
Basophils Absolute: 0.1 10*3/uL (ref 0.0–0.1)
Eosinophils Relative: 2 % (ref 0–5)
Lymphocytes Relative: 33 % (ref 12–46)
Monocytes Absolute: 0.9 10*3/uL (ref 0.1–1.0)
Monocytes Relative: 8 % (ref 3–12)
Neutro Abs: 6.2 10*3/uL (ref 1.7–7.7)

## 2011-02-27 LAB — POCT PREGNANCY, URINE: Preg Test, Ur: NEGATIVE

## 2011-02-27 LAB — LIPASE, BLOOD: Lipase: 38 U/L (ref 11–59)

## 2011-02-27 MED ORDER — HYDROCODONE-ACETAMINOPHEN 5-325 MG PO TABS: 2.0000 | ORAL_TABLET | ORAL | Status: AC | PRN

## 2011-02-27 MED ORDER — ONDANSETRON 8 MG PO TBDP: ORAL_TABLET | ORAL | Status: AC

## 2011-06-06 ENCOUNTER — Inpatient Hospital Stay (HOSPITAL_COMMUNITY)
Admission: EM | Admit: 2011-06-06 | Discharge: 2011-06-11 | DRG: 493 | Disposition: A | Discharge status: Home / Self Care | Payer: BC Managed Care – PPO | Attending: Internal Medicine | Admitting: Internal Medicine

## 2011-06-06 ENCOUNTER — Encounter (HOSPITAL_COMMUNITY): Payer: Self-pay | Admitting: *Deleted

## 2011-06-06 ENCOUNTER — Emergency Department (INDEPENDENT_AMBULATORY_CARE_PROVIDER_SITE_OTHER)
Admission: EM | Admit: 2011-06-06 | Discharge: 2011-06-06 | Disposition: A | Discharge status: Nursing Home (Includes ICF) | Payer: BC Managed Care – PPO | Source: Home / Self Care

## 2011-06-06 DIAGNOSIS — K8050 Calculus of bile duct without cholangitis or cholecystitis without obstruction: Secondary | ICD-10-CM

## 2011-06-06 DIAGNOSIS — N390 Urinary tract infection, site not specified: Secondary | ICD-10-CM | POA: Diagnosis present

## 2011-06-06 DIAGNOSIS — E876 Hypokalemia: Secondary | ICD-10-CM | POA: Diagnosis present

## 2011-06-06 DIAGNOSIS — K8021 Calculus of gallbladder without cholecystitis with obstruction: Secondary | ICD-10-CM | POA: Diagnosis present

## 2011-06-06 DIAGNOSIS — R1011 Right upper quadrant pain: Secondary | ICD-10-CM

## 2011-06-06 DIAGNOSIS — K8065 Calculus of gallbladder and bile duct with chronic cholecystitis with obstruction: Principal | ICD-10-CM | POA: Diagnosis present

## 2011-06-06 DIAGNOSIS — K8020 Calculus of gallbladder without cholecystitis without obstruction: Secondary | ICD-10-CM

## 2011-06-06 DIAGNOSIS — K8061 Calculus of gallbladder and bile duct with cholecystitis, unspecified, with obstruction: Principal | ICD-10-CM | POA: Diagnosis present

## 2011-06-06 HISTORY — DX: Calculus of gallbladder without cholecystitis without obstruction: K80.20

## 2011-06-06 LAB — URINALYSIS, ROUTINE W REFLEX MICROSCOPIC
Ketones, ur: 15 mg/dL — AB
Nitrite: NEGATIVE
Specific Gravity, Urine: 1.023 (ref 1.005–1.030)
Urobilinogen, UA: 1 mg/dL (ref 0.0–1.0)
pH: 5.5 (ref 5.0–8.0)

## 2011-06-06 LAB — COMPREHENSIVE METABOLIC PANEL
Albumin: 4.1 g/dL (ref 3.5–5.2)
BUN: 11 mg/dL (ref 6–23)
Creatinine, Ser: 0.65 mg/dL (ref 0.50–1.10)
Total Protein: 7.4 g/dL (ref 6.0–8.3)

## 2011-06-06 LAB — DIFFERENTIAL
Basophils Relative: 0 % (ref 0–1)
Eosinophils Absolute: 0 10*3/uL (ref 0.0–0.7)
Eosinophils Relative: 0 % (ref 0–5)
Monocytes Absolute: 0.8 10*3/uL (ref 0.1–1.0)
Monocytes Relative: 8 % (ref 3–12)
Neutrophils Relative %: 77 % (ref 43–77)

## 2011-06-06 LAB — CBC
HCT: 42.9 % (ref 36.0–46.0)
Hemoglobin: 15 g/dL (ref 12.0–15.0)
MCH: 32.8 pg (ref 26.0–34.0)
MCHC: 35 g/dL (ref 30.0–36.0)
MCV: 93.9 fL (ref 78.0–100.0)

## 2011-06-06 LAB — URINE MICROSCOPIC-ADD ON

## 2011-06-06 LAB — LIPASE, BLOOD: Lipase: 31 U/L (ref 11–59)

## 2011-06-06 MED ORDER — ONDANSETRON HCL 4 MG/2ML IJ SOLN
4.0000 mg | Freq: Once | INTRAMUSCULAR | Status: AC
  Administered 2011-06-06: 4 mg via INTRAVENOUS
  Filled 2011-06-06: qty 2

## 2011-06-06 MED ORDER — HYDROMORPHONE HCL PF 1 MG/ML IJ SOLN
1.0000 mg | Freq: Once | INTRAMUSCULAR | Status: AC
  Administered 2011-06-06: 1 mg via INTRAVENOUS
  Filled 2011-06-06: qty 1

## 2011-06-06 MED ORDER — SODIUM CHLORIDE 0.9 % IV BOLUS (SEPSIS)
500.0000 mL | Freq: Once | INTRAVENOUS | Status: AC
  Administered 2011-06-06: 500 mL via INTRAVENOUS

## 2011-06-06 NOTE — ED Notes (Addendum)
Pt c/o left back and abd pain. States "I was diagnosed with gallstones in dec and my pain pills just aren't working anymore. I took 1 and an hour and a half later I was hurting again." No interventions were done for gallstones when diagnosed. Pt states "pain starts here (points to RUQ) and radiates all through my back and up and down my spine sometimes."

## 2011-06-06 NOTE — ED Provider Notes (Signed)
History     CSN: [id number]  Arrival date & time 06/06/11  1420   None     Chief Complaint  Patient presents with  . Abdominal Pain    (Consider location/radiation/quality/duration/timing/severity/associated sxs/prior treatment) HPI Comments: Onset of severe right upper quadrant abdominal pain with radiation to her right back last night. Pain is constant and unrelieved with hydrocodone. Patient admits to nausea, but denies vomiting or diarrhea. She has been afebrile. Bowel movements are normal and unchanged. Patient was diagnosed with cholelithiasis December 2012 in the ED.   Past Medical History  Diagnosis Date  . Depression   . Gall stones     Past Surgical History  Procedure Date  . Dilation and curettage of uterus   . Tonsillectomy     Family History  Problem Relation Age of Onset  . Diabetes Mother   . Heart failure Father     History  Substance Use Topics  . Smoking status: Never Smoker   . Smokeless tobacco: Not on file  . Alcohol Use: Yes     occasionally    OB History    Grav Para Term Preterm Abortions TAB SAB Ect Mult Living                  Review of Systems  Respiratory: Negative for cough and shortness of breath.   Cardiovascular: Negative for chest pain.  Gastrointestinal: Positive for nausea and abdominal pain. Negative for vomiting, diarrhea and constipation.  Genitourinary: Negative for dysuria and frequency.    Allergies  Oxycodone-acetaminophen  Home Medications   Current Outpatient Rx  Name Route Sig Dispense Refill  . HYDROCODONE-ACETAMINOPHEN 5-325 MG PO TABS Oral Take 1 tablet by mouth every 6 (six) hours as needed. For pain      BP 155/72  Pulse 109  Temp(Src) 98.3 F (36.8 C) (Oral)  Resp 18  SpO2 97%  Physical Exam  Nursing note and vitals reviewed. Constitutional: She appears well-developed and well-nourished.       Patient is tearful and appears uncomfortable.  Cardiovascular: Normal rate, regular rhythm and  normal heart sounds.   Pulmonary/Chest: Effort normal and breath sounds normal. No respiratory distress.  Abdominal: Soft. Bowel sounds are normal. She exhibits no distension and no mass. There is tenderness in the right upper quadrant. There is guarding.  Skin: Skin is warm and dry.  Psychiatric: She has a normal mood and affect.    ED Course  Procedures (including critical care time)  Labs Reviewed - No data to display No results found.   1. RUQ abdominal pain   2. Cholelithiasis       MDM  Pt transferred to ED with severe RUQ abd pain, known hx of cholelithiasis. Pt not controlled with Hydrocodone at home prior to arrival.          Melody Comas, [place] 06/06/11 1554

## 2011-06-06 NOTE — ED Notes (Signed)
Patient has been dx with gallstones,  She is having increased pain under her right rib and in her back.  She also reports nausea.  Patient was transferred from ucc for further eval

## 2011-06-06 NOTE — ED Notes (Signed)
onset last night RUQ pain radiating  to her back  With nausea.  The pain is worse after eating.  She has gall stones noted on an ultrasound in Dec 2012   She had some Norco from a few months ago whick gave little relief

## 2011-06-06 NOTE — ED Provider Notes (Signed)
History     CSN: [id number]  Arrival date & time 06/06/11  1532   First MD Initiated Contact with Patient 06/06/11 2151      Chief Complaint  Patient presents with  . Chest Pain  . Back Pain  . Abdominal Pain    (Consider location/radiation/quality/duration/timing/severity/associated sxs/prior treatment) HPI  Patient presents to ER complaining of a few day hx of acute onset RUQ pain with pain radiating under her right ribs and into her back. Patient states pain is similar to pain she had in Dec 2012 when she was diagnosed with gallstones. Patient states she had left over hydrocodone-acetaminophen that she had left over from December without relief of pain. Patient states mild nausea but denies vomiting or diarrhea. Denies fevers or chills. She denies hx of abdominal surgery. Patient states she did not follow up with General surgery yet because she just recently got health insurance. Patient states pain waxes and wanes but has been constant. Denies lower abdominal pain, dysuria, hematuria or blood in stool.   Past Medical History  Diagnosis Date  . Depression   . Gall stones     Past Surgical History  Procedure Date  . Dilation and curettage of uterus   . Tonsillectomy     Family History  Problem Relation Age of Onset  . Diabetes Mother   . Heart failure Father     History  Substance Use Topics  . Smoking status: Never Smoker   . Smokeless tobacco: Not on file  . Alcohol Use: Yes     occasionally    OB History    Grav Para Term Preterm Abortions TAB SAB Ect Mult Living                  Review of Systems  All other systems reviewed and are negative.    Allergies  Oxycodone-acetaminophen  Home Medications   Current Outpatient Rx  Name Route Sig Dispense Refill  . HYDROCODONE-ACETAMINOPHEN 5-325 MG PO TABS Oral Take 1 tablet by mouth every 6 (six) hours as needed. For pain      BP 135/79  Pulse 88  Temp(Src) 99.8 F (37.7 C) (Oral)  Resp 20  Ht 5'  2" (1.575 m)  Wt 165 lb (74.844 kg)  BMI 30.18 kg/m2  SpO2 99%  Physical Exam  Nursing note and vitals reviewed. Constitutional: She is oriented to person, place, and time. She appears well-developed and well-nourished. No distress.  HENT:  Head: Normocephalic and atraumatic.  Eyes: Conjunctivae are normal.  Neck: Normal range of motion. Neck supple.  Cardiovascular: Normal rate, regular rhythm, normal heart sounds and intact distal pulses.  Exam reveals no gallop and no friction rub.   No murmur heard. Pulmonary/Chest: Effort normal and breath sounds normal. No respiratory distress. She has no wheezes. She has no rales. She exhibits no tenderness.  Abdominal: Soft. Bowel sounds are normal. She exhibits no distension and no mass. There is tenderness. There is no rebound and no guarding.       TTP of RUQ with guarding but no peritoneal signs.   Musculoskeletal: Normal range of motion. She exhibits no edema and no tenderness.  Neurological: She is alert and oriented to person, place, and time.  Skin: Skin is warm and dry. No rash noted. She is not diaphoretic. No erythema.  Psychiatric: She has a normal mood and affect.    ED Course  Procedures (including critical care time)  IV morphine and zofran.    Date: 06/07/2011  Rate: 85  Rhythm: normal sinus rhythm  QRS Axis: normal  Intervals: normal  ST/T Wave abnormalities: nonspecific ST/T changes  Conduction Disutrbances:none  Narrative Interpretation: non provocative, NO STEMI  Old EKG Reviewed: unchanged    Labs Reviewed  COMPREHENSIVE METABOLIC PANEL - Abnormal; Notable for the following:    Calcium 10.6 (*)    AST 142 (*)    ALT 117 (*)    Total Bilirubin 1.3 (*)    All other components within normal limits  URINALYSIS, ROUTINE W REFLEX MICROSCOPIC - Abnormal; Notable for the following:    Color, Urine AMBER (*) BIOCHEMICALS MAY BE AFFECTED BY COLOR   APPearance CLOUDY (*)    Hgb urine dipstick MODERATE (*)     Bilirubin Urine SMALL (*)    Ketones, ur 15 (*)    Leukocytes, UA SMALL (*)    All other components within normal limits  URINE MICROSCOPIC-ADD ON - Abnormal; Notable for the following:    Squamous Epithelial / LPF FEW (*)    All other components within normal limits  CBC  DIFFERENTIAL  LIPASE, BLOOD  POCT PREGNANCY, URINE   US Abdomen Complete  06/07/2011  *RADIOLOGY REPORT*  Clinical Data:  Right upper quadrant abdominal pain.  History of gallstones.  ABDOMINAL ULTRASOUND COMPLETE  Comparison:  Abdominal ultrasound performed 02/26/2011, and CT of the abdomen and pelvis performed 03/26/2007  Findings:  Gallbladder:  Multiple mobile gallstones are noted within the gallbladder; the gallbladder is diffusely distended, without evidence of gallbladder wall thickening or pericholecystic fluid to suggest cholecystitis.  A positive ultrasonographic Murphy's sign is elicited.  Given distension of the common hepatic duct, this most likely reflects obstruction.  Common Bile Duct:  Measures 1.2 to 1.3 cm in diameter; the common bile duct measures up to 1.3 cm at the head of the pancreas.  A 0.8 cm stone is noted within the common bile duct at the head of the pancreas, though there may be a more distal obstructing stone.  Liver:  Normal parenchymal echogenicity and echotexture; no focal lesions identified.  Limited Doppler evaluation demonstrates normal blood flow within the liver.  IVC:  Unremarkable in appearance.  Pancreas:  Although the pancreas is difficult to visualize in its entirety due to overlying bowel gas, no focal pancreatic abnormality is identified.  Spleen:  9.8 cm in length; within normal limits in size and echotexture.  Right kidney:  11.0 cm in length; normal in size, configuration and parenchymal echogenicity.  No evidence of mass or hydronephrosis.  Left kidney:  10.9 cm in length; normal in size, configuration and parenchymal echogenicity.  No evidence of mass or hydronephrosis.  Abdominal  Aorta:  Normal in caliber; no aneurysm identified.  IMPRESSION: Diffuse distension of the common hepatic duct and common bile duct to 1.3 cm in maximal diameter, with associated significant distension of the gallbladder.  0.8 cm stone noted in the common bile duct at the head of the pancreas, though there may be a more distal obstructing stone.  Positive ultrasonographic Murphy's sign elicited; this most likely reflects distal obstruction.  No evidence for cholecystitis. Additional mobile stones noted within the gallbladder.  Original Report Authenticated By: Tonia Ghent, M.D.   3:39 AM US findings discussed with Dr. Nicanor Alcon who will speak with surgery.   1. Choledocholithiasis       MDM  Dr. Corliss Skains requesting medicine admission with GI consult for consideration of MRCP. Dr. Venetia Constable to admit patient.         Lenon Oms  Dorothye Berni, [place] 06/07/11 0425

## 2011-06-07 ENCOUNTER — Inpatient Hospital Stay (HOSPITAL_COMMUNITY): Payer: BC Managed Care – PPO

## 2011-06-07 ENCOUNTER — Emergency Department (HOSPITAL_COMMUNITY): Payer: BC Managed Care – PPO

## 2011-06-07 ENCOUNTER — Encounter (HOSPITAL_COMMUNITY): Payer: Self-pay | Admitting: General Surgery

## 2011-06-07 DIAGNOSIS — K8021 Calculus of gallbladder without cholecystitis with obstruction: Secondary | ICD-10-CM | POA: Diagnosis present

## 2011-06-07 DIAGNOSIS — N390 Urinary tract infection, site not specified: Secondary | ICD-10-CM | POA: Diagnosis present

## 2011-06-07 DIAGNOSIS — K8010 Calculus of gallbladder with chronic cholecystitis without obstruction: Secondary | ICD-10-CM

## 2011-06-07 MED ORDER — HYDROMORPHONE HCL PF 1 MG/ML IJ SOLN: 1.0000 mg | INTRAMUSCULAR | Status: DC | PRN

## 2011-06-07 MED ORDER — ONDANSETRON HCL 4 MG/2ML IJ SOLN
4.0000 mg | Freq: Once | INTRAMUSCULAR | Status: AC
  Administered 2011-06-07: 4 mg via INTRAVENOUS
  Filled 2011-06-07: qty 2

## 2011-06-07 MED ORDER — DEXTROSE-NACL 5-0.9 % IV SOLN
INTRAVENOUS | Status: DC
  Administered 2011-06-07 – 2011-06-10 (×3): via INTRAVENOUS

## 2011-06-07 MED ORDER — HYDROMORPHONE HCL PF 1 MG/ML IJ SOLN
1.0000 mg | Freq: Once | INTRAMUSCULAR | Status: AC
  Administered 2011-06-07: 1 mg via INTRAVENOUS
  Filled 2011-06-07: qty 1

## 2011-06-07 MED ORDER — HYDROMORPHONE HCL PF 1 MG/ML IJ SOLN
1.0000 mg | INTRAMUSCULAR | Status: DC | PRN
  Administered 2011-06-07 – 2011-06-10 (×12): 1 mg via INTRAVENOUS
  Filled 2011-06-07 (×12): qty 1

## 2011-06-07 MED ORDER — ACETAMINOPHEN 650 MG RE SUPP: 650.0000 mg | Freq: Four times a day (QID) | RECTAL | Status: DC | PRN

## 2011-06-07 MED ORDER — ACETAMINOPHEN 325 MG PO TABS: 650.0000 mg | ORAL_TABLET | Freq: Four times a day (QID) | ORAL | Status: DC | PRN

## 2011-06-07 MED ORDER — SODIUM CHLORIDE 0.9 % IV SOLN
INTRAVENOUS | Status: AC
  Administered 2011-06-07: 06:00:00 via INTRAVENOUS

## 2011-06-07 MED ORDER — ONDANSETRON HCL 4 MG/2ML IJ SOLN
4.0000 mg | Freq: Three times a day (TID) | INTRAMUSCULAR | Status: DC | PRN
  Administered 2011-06-07: 4 mg via INTRAVENOUS
  Filled 2011-06-07 (×2): qty 2

## 2011-06-07 MED ORDER — ONDANSETRON HCL 4 MG/2ML IJ SOLN: 4.0000 mg | Freq: Three times a day (TID) | INTRAMUSCULAR | Status: DC | PRN

## 2011-06-07 MED ORDER — CIPROFLOXACIN IN D5W 400 MG/200ML IV SOLN
400.0000 mg | Freq: Two times a day (BID) | INTRAVENOUS | Status: DC
  Administered 2011-06-07 – 2011-06-09 (×6): 400 mg via INTRAVENOUS
  Filled 2011-06-07 (×8): qty 200

## 2011-06-07 MED ORDER — PANTOPRAZOLE SODIUM 40 MG PO TBEC
40.0000 mg | DELAYED_RELEASE_TABLET | Freq: Every day | ORAL | Status: DC
  Administered 2011-06-07 – 2011-06-11 (×5): 40 mg via ORAL
  Filled 2011-06-07 (×5): qty 1

## 2011-06-07 NOTE — ED Provider Notes (Signed)
Patient relates she was diagnosed with gallstones on December 31. She has had some pain off and on since then however the past 2 days her pain has been more constant. She indicates the pain is in her right upper quadrant and it radiates into her back. She's had nausea without vomiting or fever.  Patient is pale however she is alert and cooperative abdomen reveals right upper quadrant tenderness  Patient waiting to get ultrasound of her gallbladder. She has new elevation of her LFTs.  Medical screening examination/treatment/procedure(s) were conducted as a shared visit with non-physician practitioner(s) and myself.  I personally evaluated the patient during the encounter Devoria Albe, MD, Franz Dell, MD 06/07/11 [phone]

## 2011-06-07 NOTE — ED Provider Notes (Signed)
Medical screening examination/treatment/procedure(s) were performed by non-physician practitioner and as supervising physician I was immediately available for consultation/collaboration.  Raynald Blend, MD 06/07/11 [phone]

## 2011-06-07 NOTE — ED Notes (Signed)
Patient currently resting quietly in bed; no respiratory or acute distress noted.  Ultrasound called about delay; no answer.  Will try again.

## 2011-06-07 NOTE — ED Notes (Signed)
Patient resting quietly in bed; no respiratory or acute distress noted.  Patient has no questions or concerns at this time; no further orders from PA at this time.  Will continue to monitor.

## 2011-06-07 NOTE — ED Notes (Signed)
Patient asleep in bed; no respiratory or acute distress noted.  Will continue to monitor. 

## 2011-06-07 NOTE — Consult Note (Signed)
Reason for Consult: Gallstones and CBD obstruction Referring Doctor : [facility]  [name] is an 46 y.o. female.   HPI: [name] is a pleasant 8 year woman with no significant past history except for depression, diagnosed with gallstones in December 2012- was supposed to have outpatient cholecystectomy- but did not have insurance-was admitted to hospital with right upper quadrant abdominal pain for last 3 days associated with nausea but no vomiting. Pain radiates to back once in a while. Has no diarrhea. She did have intermittent right upper quadrant abdominal pain after meals prior to this episode of pain since December 2012 but nothing was as worse as this.  denies any fever, chills, weight loss.  Past Medical History  Diagnosis Date  . Depression   . Gall stones     Past Surgical History  Procedure Date  . Dilation and curettage of uterus   . Tonsillectomy     Family History  Problem Relation Age of Onset  . Diabetes Mother   . Heart failure Father     Social History:  reports that she has never smoked. She does not have any smokeless tobacco history on file. She reports that she drinks alcohol. She reports that she does not use illicit drugs.  Allergies:  Allergies  Allergen Reactions  . Oxycodone-Acetaminophen     REACTION: itch    Medications: I have reviewed the patient's current medications.  Results for orders placed during the hospital encounter of 06/06/11 (from the past 48 hour(s))  CBC     Status: Normal   Collection Time   06/06/11 10:02 PM      Component Value Range Comment   WBC 9.9  4.0 - 10.5 (K/uL)    RBC 4.57  3.87 - 5.11 (MIL/uL)    Hemoglobin 15.0  12.0 - 15.0 (g/dL)    HCT 16.1  09.6 - 04.5 (%)    MCV 93.9  78.0 - 100.0 (fL)    MCH 32.8  26.0 - 34.0 (pg)    MCHC 35.0  30.0 - 36.0 (g/dL)    RDW 40.9  81.1 - 91.4 (%)    Platelets 209  150 - 400 (K/uL)   DIFFERENTIAL     Status: Normal   Collection Time   06/06/11 10:02 PM   Component Value Range Comment   Neutrophils Relative 77  43 - 77 (%)    Neutro Abs 7.7  1.7 - 7.7 (K/uL)    Lymphocytes Relative 14  12 - 46 (%)    Lymphs Abs 1.4  0.7 - 4.0 (K/uL)    Monocytes Relative 8  3 - 12 (%)    Monocytes Absolute 0.8  0.1 - 1.0 (K/uL)    Eosinophils Relative 0  0 - 5 (%)    Eosinophils Absolute 0.0  0.0 - 0.7 (K/uL)    Basophils Relative 0  0 - 1 (%)    Basophils Absolute 0.0  0.0 - 0.1 (K/uL)   COMPREHENSIVE METABOLIC PANEL     Status: Abnormal   Collection Time   06/06/11 10:02 PM      Component Value Range Comment   Sodium 138  135 - 145 (mEq/L)    Potassium 4.0  3.5 - 5.1 (mEq/L)    Chloride 102  96 - 112 (mEq/L)    CO2 25  19 - 32 (mEq/L)    Glucose, Bld 92  70 - 99 (mg/dL)    BUN 11  6 - 23 (mg/dL)  Creatinine, Ser 0.65  0.50 - 1.10 (mg/dL)    Calcium 40.9 (*) 8.4 - 10.5 (mg/dL)    Total Protein 7.4  6.0 - 8.3 (g/dL)    Albumin 4.1  3.5 - 5.2 (g/dL)    AST 811 (*) 0 - 37 (U/L)    ALT 117 (*) 0 - 35 (U/L)    Alkaline Phosphatase 66  39 - 117 (U/L)    Total Bilirubin 1.3 (*) 0.3 - 1.2 (mg/dL)    GFR calc non Af Amer >90  >90 (mL/min)    GFR calc Af Amer >90  >90 (mL/min)   LIPASE, BLOOD     Status: Normal   Collection Time   06/06/11 10:02 PM      Component Value Range Comment   Lipase 31  11 - 59 (U/L)   URINALYSIS, ROUTINE W REFLEX MICROSCOPIC     Status: Abnormal   Collection Time   06/06/11 10:18 PM      Component Value Range Comment   Color, Urine AMBER (*) YELLOW  BIOCHEMICALS MAY BE AFFECTED BY COLOR   APPearance CLOUDY (*) CLEAR     Specific Gravity, Urine 1.023  1.005 - 1.030     pH 5.5  5.0 - 8.0     Glucose, UA NEGATIVE  NEGATIVE (mg/dL)    Hgb urine dipstick MODERATE (*) NEGATIVE     Bilirubin Urine SMALL (*) NEGATIVE     Ketones, ur 15 (*) NEGATIVE (mg/dL)    Protein, ur NEGATIVE  NEGATIVE (mg/dL)    Urobilinogen, UA 1.0  0.0 - 1.0 (mg/dL)    Nitrite NEGATIVE  NEGATIVE     Leukocytes, UA SMALL (*) NEGATIVE    URINE  MICROSCOPIC-ADD ON     Status: Abnormal   Collection Time   06/06/11 10:18 PM      Component Value Range Comment   Squamous Epithelial / LPF FEW (*) RARE     WBC, UA 0-2  <3 (WBC/hpf)    RBC / HPF 3-6  <3 (RBC/hpf)    Bacteria, UA RARE  RARE    POCT PREGNANCY, URINE     Status: Normal   Collection Time   06/06/11 10:34 PM      Component Value Range Comment   Preg Test, Ur NEGATIVE  NEGATIVE      X-ray Chest Pa And Lateral   06/07/2011  *RADIOLOGY REPORT*  Clinical Data: Preop for cholecystectomy.  CHEST - 2 VIEW  Comparison: Chest CT 06/11/2009.  Findings: The cardiac silhouette, mediastinal and hilar contours are within normal limits.  The lungs are clear.  No pleural effusion.  The bony thorax is intact.  IMPRESSION: No acute cardiopulmonary findings.  Original Report Authenticated By: P. Loralie Champagne, M.D.   US Abdomen Complete  06/07/2011  *RADIOLOGY REPORT*  Clinical Data:  Right upper quadrant abdominal pain.  History of gallstones.  ABDOMINAL ULTRASOUND COMPLETE  Comparison:  Abdominal ultrasound performed 02/26/2011, and CT of the abdomen and pelvis performed 03/26/2007  Findings:  Gallbladder:  Multiple mobile gallstones are noted within the gallbladder; the gallbladder is diffusely distended, without evidence of gallbladder wall thickening or pericholecystic fluid to suggest cholecystitis.  A positive ultrasonographic Murphy's sign is elicited.  Given distension of the common hepatic duct, this most likely reflects obstruction.  Common Bile Duct:  Measures 1.2 to 1.3 cm in diameter; the common bile duct measures up to 1.3 cm at the head of the pancreas.  A 0.8 cm stone is noted within the common  bile duct at the head of the pancreas, though there may be a more distal obstructing stone.  Liver:  Normal parenchymal echogenicity and echotexture; no focal lesions identified.  Limited Doppler evaluation demonstrates normal blood flow within the liver.  IVC:  Unremarkable in appearance.   Pancreas:  Although the pancreas is difficult to visualize in its entirety due to overlying bowel gas, no focal pancreatic abnormality is identified.  Spleen:  9.8 cm in length; within normal limits in size and echotexture.  Right kidney:  11.0 cm in length; normal in size, configuration and parenchymal echogenicity.  No evidence of mass or hydronephrosis.  Left kidney:  10.9 cm in length; normal in size, configuration and parenchymal echogenicity.  No evidence of mass or hydronephrosis.  Abdominal Aorta:  Normal in caliber; no aneurysm identified.  IMPRESSION: Diffuse distension of the common hepatic duct and common bile duct to 1.3 cm in maximal diameter, with associated significant distension of the gallbladder.  0.8 cm stone noted in the common bile duct at the head of the pancreas, though there may be a more distal obstructing stone.  Positive ultrasonographic Murphy's sign elicited; this most likely reflects distal obstruction.  No evidence for cholecystitis. Additional mobile stones noted within the gallbladder.  Original Report Authenticated By: Tonia Ghent, M.D.    ROS:  As stated above in the HPI otherwise negative.  Blood pressure 125/80, pulse 89, temperature 98.2 F (36.8 C), temperature source Oral, resp. rate 20, height 5\' 2"  (1.575 m), weight 74.3 kg (163 lb 12.8 oz), SpO2 99.00%.  PE: Gen: NAD, Alert and Oriented HEENT:  Murray/AT, EOMI Neck: Supple, no LAD Lungs: CTA Bilaterally CV: RRR without M/G/R ABM: Soft,  mildly tender to palpation in right upper quadrant, ND, +BS Ext: No C/C/E  Assessment/Plan: #Gallstones with CBD dilatation: Patient with biliary pain and ultrasound showing CBD and common hepatic duct dilation with possible 0.8 mm CBD stone. No signs or symptoms of acute cholangitis at present. WBC and alkaline phosphatase normal and patient afebrile. AST 142 and ALT 117 with T. bili 1.3 - Will consider ERCP with papillotomy per Dr. Elnoria Howard likely tomorrow if schedule  permits. - Definitive approach with cholecystectomy later per surgical team.  PATEL,RAVI 06/07/2011, 11:15 AM

## 2011-06-07 NOTE — H&P (Addendum)
PCP:   Sheila Oats, MD, MD   Chief Complaint:  Abdominal pain for 2 days.  HPI: [name] comes in with complaints of RUQ abdominal pain radiating to the back. The pain is described as severe, and started 2 days ago. It is associated with nausea, but no diarrhea. No dysuria or cough, fever or chills. Described as constant. Patient experienced relief with dilaudid given in ED. An ultrasound showed "Diffuse distension of the common hepatic duct and common bile duct to 1.3 cm in maximal diameter, with associated significant distension of the gallbladder. 0.8 cm stone noted in the common bile duct at the head of the pancreas, though there may be a more distal obstructing stone. Positive ultrasonographic Murphy's sign elicited; this most likely reflects distal obstruction. No evidence for cholecystitis. Additional mobile stones noted within the gallbladder. " Patient was first diagnosed of symptomatic gall stones in Dec'12, but did not have them removed as she says she did not have insurance, hence did not follow with a physician after the ED visit. She was referred to the medical service today for surgery/gi would see in consult.   Review of Systems:  negative except as highlighted in the hpi.  Past Medical History: Past Medical History  Diagnosis Date  . Depression   . Gall stones    Past Surgical History  Procedure Date  . Dilation and curettage of uterus   . Tonsillectomy     Medications: Prior to Admission medications   Not on File    Allergies:   Allergies  Allergen Reactions  . Oxycodone-Acetaminophen     REACTION: itch    Social History:  reports that she has never smoked. She does not have any smokeless tobacco history on file. She reports that she drinks alcohol. She reports that she does not use illicit drugs.Lives with her daughter.  Family History: Family History  Problem Relation Age of Onset  . Diabetes Mother   . Heart failure Father     Physical  Exam: Filed Vitals:   06/07/11 0013 06/07/11 0412 06/07/11 0535 06/07/11 0600  BP: 103/57 113/85 125/80   Pulse: 82 78 89   Temp: 98.1 F (36.7 C) 97.9 F (36.6 C) 98.2 F (36.8 C)   TempSrc: Oral Oral    Resp: 16 18 20    Height:    5\' 2"  (1.575 m)  Weight:   74.3 kg (163 lb 12.8 oz)   SpO2: 99% 100% 99%    Comfortable at rest. No jaundice.  Lungs clear. S1S2 heard, No murmurs. Abdomen- soft, tenderness to deep palpation RUQ. No rebound or guarding. No palpable organomegaly. +BS. CNS- grossly intact. Extremeities- no pedal edema.   Labs on Admission:   [facility] 06/06/11 2202  NA 138  K 4.0  CL 102  CO2 25  GLUCOSE 92  BUN 11  CREATININE 0.65  CALCIUM 10.6*  MG --  PHOS --    Basename 06/06/11 2202  AST 142*  ALT 117*  ALKPHOS 66  BILITOT 1.3*  PROT 7.4  ALBUMIN 4.1    Basename 06/06/11 2202  LIPASE 31  AMYLASE --    Basename 06/06/11 2202  WBC 9.9  NEUTROABS 7.7  HGB 15.0  HCT 42.9  MCV 93.9  PLT 209   No results found for this basename: CKTOTAL:3,CKMB:3,CKMBINDEX:3,TROPONINI:3 in the last 72 hours No results found for this basename: TSH,T4TOTAL,FREET3,T3FREE,THYROIDAB in the last 72 hours No results found for this basename: VITAMINB12:2,FOLATE:2,FERRITIN:2,TIBC:2,IRON:2,RETICCTPCT:2 in the last 72 hours  Radiological Exams on Admission:  US Abdomen Complete  06/07/2011  *RADIOLOGY REPORT*  Clinical Data:  Right upper quadrant abdominal pain.  History of gallstones.  ABDOMINAL ULTRASOUND COMPLETE  Comparison:  Abdominal ultrasound performed 02/26/2011, and CT of the abdomen and pelvis performed 03/26/2007  Findings:  Gallbladder:  Multiple mobile gallstones are noted within the gallbladder; the gallbladder is diffusely distended, without evidence of gallbladder wall thickening or pericholecystic fluid to suggest cholecystitis.  A positive ultrasonographic Murphy's sign is elicited.  Given distension of the common hepatic duct, this most likely reflects  obstruction.  Common Bile Duct:  Measures 1.2 to 1.3 cm in diameter; the common bile duct measures up to 1.3 cm at the head of the pancreas.  A 0.8 cm stone is noted within the common bile duct at the head of the pancreas, though there may be a more distal obstructing stone.  Liver:  Normal parenchymal echogenicity and echotexture; no focal lesions identified.  Limited Doppler evaluation demonstrates normal blood flow within the liver.  IVC:  Unremarkable in appearance.  Pancreas:  Although the pancreas is difficult to visualize in its entirety due to overlying bowel gas, no focal pancreatic abnormality is identified.  Spleen:  9.8 cm in length; within normal limits in size and echotexture.  Right kidney:  11.0 cm in length; normal in size, configuration and parenchymal echogenicity.  No evidence of mass or hydronephrosis.  Left kidney:  10.9 cm in length; normal in size, configuration and parenchymal echogenicity.  No evidence of mass or hydronephrosis.  Abdominal Aorta:  Normal in caliber; no aneurysm identified.  IMPRESSION: Diffuse distension of the common hepatic duct and common bile duct to 1.3 cm in maximal diameter, with associated significant distension of the gallbladder.  0.8 cm stone noted in the common bile duct at the head of the pancreas, though there may be a more distal obstructing stone.  Positive ultrasonographic Murphy's sign elicited; this most likely reflects distal obstruction.  No evidence for cholecystitis. Additional mobile stones noted within the gallbladder.  Original Report Authenticated By: Tonia Ghent, M.D.    Assessment Symptomatic gallstones in 46 year old female, who also has a uti. Plan .Cholelithiasis with obstruction- admit med/surg. Will keep npo till seen by surgery. Analgesics/antiemetics/ivf. Defer further work up to surgery/gi. Have consulted surgery/gi. [facility]/Dr Loreta Ave respectively will graciously see patient later today. preop cxr .UTI (lower urinary tract infection)-  follow urine culture. Ciprofloxacin. Dvt/gi prophylaxis. Condition fair.   [name] [phone] 06/07/2011, 8:24 AM

## 2011-06-07 NOTE — Progress Notes (Signed)
Pt admitted for "gallstones". Pt c/o right upper quad pain. Pt repts intermittent nausea. Pt repts that her pain is a 2 on scale of 1-10 at this time. Pt repts that pain and nausea is "better" with medications. Pt is currently NPO. Pt is alert and oriented x 3. Pt ambulates with steady gait but pt repts decreased ROM of abdomen related to pain. BS faint x nonexistent x 4. Pt denies passing gas. Pt repts LBM 4/9. Lungs CTA. No s/sx resp or cardiac distress and no c/o such. Vital signs are stable. Heart rate regular rate and rhythm. Pt turns self and floats heels. Pt voids quantity sufficient but repts she has had freq and urgency for a couple of days. UA obtained on admit and will rept to MD on rounds for further orders.

## 2011-06-07 NOTE — Progress Notes (Signed)
Utilization Review Completed.[employer] T4/11/2011   

## 2011-06-07 NOTE — ED Notes (Signed)
Patient currently resting quietly in bed; no respiratory or acute distress noted.  Patient updated on plan of care; informed patient that ultrasound tech is at [facility] and is coming to [facility] as soon as she is finished.  Patient has no other questions or concerns; will continue to monitor.

## 2011-06-07 NOTE — Consult Note (Signed)
[name] [name] 1965-11-17  [id number].   Requesting MD: Dr. Venetia Constable Chief Complaint/Reason for Consult: cholelithiasis HPI: this is a 46 yo female who had an episode of RUQ abdominal pain in December.  She came to the [facility] at that time and was diagnosed with cholelithiasis.  She was informed to follow up, but because she didn't have insurance she waited.  Two days ago she developed recurrent RUQ abdominal pain.  This time, instead of being intermittent, it was persistent.  She had nausea, but no emesis.  She denies diarrhea.  Due to continued pain, she came back to [facility] and had another U/S.  This revealed a dilated biliary tree with a 1.3cm CBD and at least one CBD stone measuring 0.8cm.  The patient was admitted and we were asked to evaluate for cholecystectomy.  Review of Systems: Please see HPI, otherwise all other systems are negative.  Family History  Problem Relation Age of Onset  . Diabetes Mother   . Heart failure Father     Past Medical History  Diagnosis Date  . Depression   . Gall stones     Past Surgical History  Procedure Date  . Dilation and curettage of uterus   . Tonsillectomy     Social History:  reports that she has never smoked. She does not have any smokeless tobacco history on file. She reports that she drinks alcohol. She reports that she does not use illicit drugs.  Allergies:  Allergies  Allergen Reactions  . Oxycodone-Acetaminophen     REACTION: itch    Medications Prior to Admission  Medication Dose Route Frequency Provider Last Rate Last Dose  . 0.9 %  sodium chloride infusion   Intravenous STAT Jenness [place], PA 75 mL/hr at 06/07/11 0557    . acetaminophen (TYLENOL) tablet 650 mg  650 mg Oral Q6H PRN Simbiso Ranga, MD       Or  . acetaminophen (TYLENOL) suppository 650 mg  650 mg Rectal Q6H PRN Simbiso Ranga, MD      . ciprofloxacin (CIPRO) IVPB 400 mg  400 mg Intravenous Q12H Simbiso Ranga, MD   400 mg at 06/07/11 0945  . dextrose 5 %-0.9 % sodium  chloride infusion   Intravenous Continuous Simbiso Ranga, MD 75 mL/hr at 06/07/11 0850    . HYDROmorphone (DILAUDID) injection 1 mg  1 mg Intravenous Once Jenness [place], PA   1 mg at 06/06/11 2215  . HYDROmorphone (DILAUDID) injection 1 mg  1 mg Intravenous Once [employer], PA   1 mg at 06/07/11 0419  . HYDROmorphone (DILAUDID) injection 1 mg  1 mg Intravenous Q3H PRN Simbiso Ranga, MD      . ondansetron (ZOFRAN) injection 4 mg  4 mg Intravenous Once Jenness Corner, PA   4 mg at 06/06/11 2216  . ondansetron (ZOFRAN) injection 4 mg  4 mg Intravenous Once Jenness Corner, PA   4 mg at 06/07/11 0419  . ondansetron (ZOFRAN) injection 4 mg  4 mg Intravenous Q8H PRN Simbiso Ranga, MD      . pantoprazole (PROTONIX) EC tablet 40 mg  40 mg Oral Q1200 Simbiso Ranga, MD      . sodium chloride 0.9 % bolus 500 mL  500 mL Intravenous Once Jenness Corner, PA   500 mL at 06/06/11 2215  . DISCONTD: HYDROmorphone (DILAUDID) injection 1 mg  1 mg Intravenous Q4H PRN Jenness Corner, PA      . DISCONTD: ondansetron (ZOFRAN) injection 4  mg  4 mg Intravenous Q8H PRN Jenness Corner, PA       No current outpatient prescriptions on file as of 06/07/2011.    Blood pressure 125/80, pulse 89, temperature 98.2 F (36.8 C), temperature source Oral, resp. rate 20, height 5\' 2"  (1.575 m), weight 163 lb 12.8 oz (74.3 kg), SpO2 99.00%. Physical Exam: General: pleasant, WD, WN white female who is laying in bed in NAD HEENT: head is normocephalic, atraumatic.  Sclera are noninjected.  PERRL.  Ears and nose without any masses or lesions.  Mouth is pink and moist Heart: regular, rate, and rhythm.  Normal s1,s2. No obvious murmurs, gallops, or rubs noted.  Palpable radial and pedal pulses bilaterally Lungs: CTAB, no wheezes, rhonchi, or rales noted.  Respiratory effort nonlabored Abd: soft, mild tenderness in RUQ, no Murphy's sign, ND, +BS, no masses, hernias, or organomegaly MS: all 4 extremities are symmetrical with no cyanosis,  clubbing, or edema. Skin: warm and dry with no masses, lesions, or rashes Psych: A&Ox3 with an appropriate affect.    Results for orders placed during the hospital encounter of 06/06/11 (from the past 48 hour(s))  CBC     Status: Normal   Collection Time   06/06/11 10:02 PM      Component Value Range Comment   WBC 9.9  4.0 - 10.5 (K/uL)    RBC 4.57  3.87 - 5.11 (MIL/uL)    Hemoglobin 15.0  12.0 - 15.0 (g/dL)    HCT 16.1  09.6 - 04.5 (%)    MCV 93.9  78.0 - 100.0 (fL)    MCH 32.8  26.0 - 34.0 (pg)    MCHC 35.0  30.0 - 36.0 (g/dL)    RDW 40.9  81.1 - 91.4 (%)    Platelets 209  150 - 400 (K/uL)   DIFFERENTIAL     Status: Normal   Collection Time   06/06/11 10:02 PM      Component Value Range Comment   Neutrophils Relative 77  43 - 77 (%)    Neutro Abs 7.7  1.7 - 7.7 (K/uL)    Lymphocytes Relative 14  12 - 46 (%)    Lymphs Abs 1.4  0.7 - 4.0 (K/uL)    Monocytes Relative 8  3 - 12 (%)    Monocytes Absolute 0.8  0.1 - 1.0 (K/uL)    Eosinophils Relative 0  0 - 5 (%)    Eosinophils Absolute 0.0  0.0 - 0.7 (K/uL)    Basophils Relative 0  0 - 1 (%)    Basophils Absolute 0.0  0.0 - 0.1 (K/uL)   COMPREHENSIVE METABOLIC PANEL     Status: Abnormal   Collection Time   06/06/11 10:02 PM      Component Value Range Comment   Sodium 138  135 - 145 (mEq/L)    Potassium 4.0  3.5 - 5.1 (mEq/L)    Chloride 102  96 - 112 (mEq/L)    CO2 25  19 - 32 (mEq/L)    Glucose, Bld 92  70 - 99 (mg/dL)    BUN 11  6 - 23 (mg/dL)    Creatinine, Ser 7.82  0.50 - 1.10 (mg/dL)    Calcium 95.6 (*) 8.4 - 10.5 (mg/dL)    Total Protein 7.4  6.0 - 8.3 (g/dL)    Albumin 4.1  3.5 - 5.2 (g/dL)    AST 213 (*) 0 - 37 (U/L)    ALT 117 (*) 0 - 35 (U/L)  Alkaline Phosphatase 66  39 - 117 (U/L)    Total Bilirubin 1.3 (*) 0.3 - 1.2 (mg/dL)    GFR calc non Af Amer >90  >90 (mL/min)    GFR calc Af Amer >90  >90 (mL/min)   LIPASE, BLOOD     Status: Normal   Collection Time   06/06/11 10:02 PM      Component Value Range  Comment   Lipase 31  11 - 59 (U/L)   URINALYSIS, ROUTINE W REFLEX MICROSCOPIC     Status: Abnormal   Collection Time   06/06/11 10:18 PM      Component Value Range Comment   Color, Urine AMBER (*) YELLOW  BIOCHEMICALS MAY BE AFFECTED BY COLOR   APPearance CLOUDY (*) CLEAR     Specific Gravity, Urine 1.023  1.005 - 1.030     pH 5.5  5.0 - 8.0     Glucose, UA NEGATIVE  NEGATIVE (mg/dL)    Hgb urine dipstick MODERATE (*) NEGATIVE     Bilirubin Urine SMALL (*) NEGATIVE     Ketones, ur 15 (*) NEGATIVE (mg/dL)    Protein, ur NEGATIVE  NEGATIVE (mg/dL)    Urobilinogen, UA 1.0  0.0 - 1.0 (mg/dL)    Nitrite NEGATIVE  NEGATIVE     Leukocytes, UA SMALL (*) NEGATIVE    URINE MICROSCOPIC-ADD ON     Status: Abnormal   Collection Time   06/06/11 10:18 PM      Component Value Range Comment   Squamous Epithelial / LPF FEW (*) RARE     WBC, UA 0-2  <3 (WBC/hpf)    RBC / HPF 3-6  <3 (RBC/hpf)    Bacteria, UA RARE  RARE    POCT PREGNANCY, URINE     Status: Normal   Collection Time   06/06/11 10:34 PM      Component Value Range Comment   Preg Test, Ur NEGATIVE  NEGATIVE     X-ray Chest Pa And Lateral   06/07/2011  *RADIOLOGY REPORT*  Clinical Data: Preop for cholecystectomy.  CHEST - 2 VIEW  Comparison: Chest CT 06/11/2009.  Findings: The cardiac silhouette, mediastinal and hilar contours are within normal limits.  The lungs are clear.  No pleural effusion.  The bony thorax is intact.  IMPRESSION: No acute cardiopulmonary findings.  Original Report Authenticated By: P. Loralie Champagne, M.D.   US Abdomen Complete  06/07/2011  *RADIOLOGY REPORT*  Clinical Data:  Right upper quadrant abdominal pain.  History of gallstones.  ABDOMINAL ULTRASOUND COMPLETE  Comparison:  Abdominal ultrasound performed 02/26/2011, and CT of the abdomen and pelvis performed 03/26/2007  Findings:  Gallbladder:  Multiple mobile gallstones are noted within the gallbladder; the gallbladder is diffusely distended, without evidence of  gallbladder wall thickening or pericholecystic fluid to suggest cholecystitis.  A positive ultrasonographic Murphy's sign is elicited.  Given distension of the common hepatic duct, this most likely reflects obstruction.  Common Bile Duct:  Measures 1.2 to 1.3 cm in diameter; the common bile duct measures up to 1.3 cm at the head of the pancreas.  A 0.8 cm stone is noted within the common bile duct at the head of the pancreas, though there may be a more distal obstructing stone.  Liver:  Normal parenchymal echogenicity and echotexture; no focal lesions identified.  Limited Doppler evaluation demonstrates normal blood flow within the liver.  IVC:  Unremarkable in appearance.  Pancreas:  Although the pancreas is difficult to visualize in its entirety due to overlying  bowel gas, no focal pancreatic abnormality is identified.  Spleen:  9.8 cm in length; within normal limits in size and echotexture.  Right kidney:  11.0 cm in length; normal in size, configuration and parenchymal echogenicity.  No evidence of mass or hydronephrosis.  Left kidney:  10.9 cm in length; normal in size, configuration and parenchymal echogenicity.  No evidence of mass or hydronephrosis.  Abdominal Aorta:  Normal in caliber; no aneurysm identified.  IMPRESSION: Diffuse distension of the common hepatic duct and common bile duct to 1.3 cm in maximal diameter, with associated significant distension of the gallbladder.  0.8 cm stone noted in the common bile duct at the head of the pancreas, though there may be a more distal obstructing stone.  Positive ultrasonographic Murphy's sign elicited; this most likely reflects distal obstruction.  No evidence for cholecystitis. Additional mobile stones noted within the gallbladder.  Original Report Authenticated By: Tonia Ghent, M.D.       Assessment/Plan 1. Choledocholithiasis 2. Cholelithiasis  Plan: 1. The patient has at least one CBD stone seen on her abdominal u/s which is likely what is  causing her persistent pain.  GI has evaluated the patient.  She will likely need an ERCP prior to surgical intervention.  Right now, we will plan on pursuing surgical intervention after ERCP.  We will await final GI recommendations for further more definite plans.  Doral Digangi E 06/07/2011, 10:55 AM

## 2011-06-07 NOTE — Consult Note (Signed)
Noted plans for ERCP tomorrow.  Will plan lap chole 4/12.  Plan D/W patient including details of the procedure. Patient examined and I agree with the assessment and plan  Violeta Gelinas, MD, MPH, FACS Pager: [phone]  06/07/2011 5:42 PM

## 2011-06-07 NOTE — ED Provider Notes (Signed)
See prior note   Ward Givens, MD 06/07/11 1546

## 2011-06-08 ENCOUNTER — Encounter (HOSPITAL_COMMUNITY)
Admission: EM | Disposition: A | Discharge status: Home / Self Care | Payer: Self-pay | Source: Home / Self Care | Attending: Internal Medicine

## 2011-06-08 ENCOUNTER — Encounter (HOSPITAL_COMMUNITY): Payer: Self-pay | Admitting: *Deleted

## 2011-06-08 ENCOUNTER — Inpatient Hospital Stay (HOSPITAL_COMMUNITY): Payer: BC Managed Care – PPO

## 2011-06-08 HISTORY — PX: ERCP: SHX5425

## 2011-06-08 LAB — CBC
HCT: 39.2 % (ref 36.0–46.0)
MCHC: 33.9 g/dL (ref 30.0–36.0)
MCV: 95.4 fL (ref 78.0–100.0)
Platelets: 188 10*3/uL (ref 150–400)
RDW: 12.5 % (ref 11.5–15.5)

## 2011-06-08 LAB — LIPID PANEL
Cholesterol: 156 mg/dL (ref 0–200)
HDL: 49 mg/dL (ref >39)
Total CHOL/HDL Ratio: 3.2 RATIO
Triglycerides: 67 mg/dL (ref <150)
VLDL: 13 mg/dL (ref 0–40)

## 2011-06-08 LAB — COMPREHENSIVE METABOLIC PANEL
ALT: 58 U/L — ABNORMAL HIGH (ref 0–35)
Albumin: 3.2 g/dL — ABNORMAL LOW (ref 3.5–5.2)
Alkaline Phosphatase: 54 U/L (ref 39–117)
Chloride: 108 mEq/L (ref 96–112)
Glucose, Bld: 95 mg/dL (ref 70–99)
Potassium: 4 mEq/L (ref 3.5–5.1)
Sodium: 142 mEq/L (ref 135–145)
Total Bilirubin: 0.4 mg/dL (ref 0.3–1.2)
Total Protein: 5.9 g/dL — ABNORMAL LOW (ref 6.0–8.3)

## 2011-06-08 LAB — URINE CULTURE: Colony Count: 10000

## 2011-06-08 SURGERY — ERCP, WITH INTERVENTION IF INDICATED
Anesthesia: Moderate Sedation

## 2011-06-08 MED ORDER — MUPIROCIN 2 % EX OINT
1.0000 "application " | TOPICAL_OINTMENT | Freq: Two times a day (BID) | CUTANEOUS | Status: DC
  Administered 2011-06-08 – 2011-06-11 (×8): 1 via NASAL
  Filled 2011-06-08: qty 22

## 2011-06-08 MED ORDER — SODIUM CHLORIDE 0.9 % IV SOLN
INTRAVENOUS | Status: DC | PRN
  Administered 2011-06-08: 17:00:00

## 2011-06-08 MED ORDER — MIDAZOLAM HCL 10 MG/2ML IJ SOLN
INTRAMUSCULAR | Status: DC | PRN
  Administered 2011-06-08: 2 mg via INTRAVENOUS
  Administered 2011-06-08: 1 mg via INTRAVENOUS
  Administered 2011-06-08 (×3): 2 mg via INTRAVENOUS
  Administered 2011-06-08 (×2): 1 mg via INTRAVENOUS

## 2011-06-08 MED ORDER — DIPHENHYDRAMINE HCL 50 MG/ML IJ SOLN
INTRAMUSCULAR | Status: AC
  Filled 2011-06-08: qty 1

## 2011-06-08 MED ORDER — GLUCAGON HCL (RDNA) 1 MG IJ SOLR
INTRAMUSCULAR | Status: AC
  Filled 2011-06-08: qty 1

## 2011-06-08 MED ORDER — FENTANYL CITRATE 0.05 MG/ML IJ SOLN
INTRAMUSCULAR | Status: DC | PRN
  Administered 2011-06-08 (×4): 25 ug via INTRAVENOUS

## 2011-06-08 MED ORDER — MIDAZOLAM HCL 10 MG/2ML IJ SOLN
INTRAMUSCULAR | Status: AC
  Filled 2011-06-08: qty 4

## 2011-06-08 MED ORDER — BUTAMBEN-TETRACAINE-BENZOCAINE 2-2-14 % EX AERO
INHALATION_SPRAY | CUTANEOUS | Status: DC | PRN
  Administered 2011-06-08: 2 via TOPICAL

## 2011-06-08 MED ORDER — GLUCAGON HCL (RDNA) 1 MG IJ SOLR
INTRAMUSCULAR | Status: DC | PRN
  Administered 2011-06-08 (×2): 1 mg via INTRAVENOUS

## 2011-06-08 MED ORDER — SODIUM CHLORIDE 0.9 % IV SOLN
INTRAVENOUS | Status: DC
  Administered 2011-06-08: 500 mL via INTRAVENOUS

## 2011-06-08 MED ORDER — FENTANYL CITRATE 0.05 MG/ML IJ SOLN
INTRAMUSCULAR | Status: AC
  Filled 2011-06-08: qty 4

## 2011-06-08 MED ORDER — CHLORHEXIDINE GLUCONATE CLOTH 2 % EX PADS
6.0000 | MEDICATED_PAD | Freq: Every day | CUTANEOUS | Status: DC
  Administered 2011-06-08 – 2011-06-11 (×3): 6 via TOPICAL

## 2011-06-08 NOTE — Progress Notes (Signed)
Subjective: Some c/o abd pain today.  For ERCP this afternoon   Objective: Vital signs in last 24 hours: Temp:  [97.7 F (36.5 C)-98.5 F (36.9 C)] 97.7 F (36.5 C) (04/11 0609) Pulse Rate:  [70-89] 89  (04/11 0609) Resp:  [18] 18  (04/11 0609) BP: (97-120)/(51-83) 100/60 mmHg (04/11 0620) SpO2:  [97 %-100 %] 99 % (04/11 0609) Last BM Date: 06/06/11  Intake/Output from previous day: 04/10 0701 - 04/11 0700 In: 1900 [I.V.:1500; IV Piggyback:400] Out: 2 [Urine:2] Intake/Output this shift: Total I/O In: -  Out: 1 [Urine:1]  General appearance: alert, cooperative and mild distress Resp: clear to auscultation bilaterally GI: abnormal findings:  +BS, mildly tender RUQ, soft   Lab Results:   Basename 06/08/11 0530 06/06/11 2202  WBC 7.7 9.9  HGB 13.3 15.0  HCT 39.2 42.9  PLT 188 209   BMET  Basename 06/08/11 0530 06/06/11 2202  NA 142 138  K 4.0 4.0  CL 108 102  CO2 26 25  GLUCOSE 95 92  BUN 6 11  CREATININE 0.71 0.65  CALCIUM 9.8 10.6*   PT/INR  Basename 06/08/11 0530  LABPROT 14.4  INR 1.10   ABG No results found for this basename: PHART:2,PCO2:2,PO2:2,HCO3:2 in the last 72 hours  Studies/Results: X-ray Chest Pa And Lateral   06/07/2011  *RADIOLOGY REPORT*  Clinical Data: Preop for cholecystectomy.  CHEST - 2 VIEW  Comparison: Chest CT 06/11/2009.  Findings: The cardiac silhouette, mediastinal and hilar contours are within normal limits.  The lungs are clear.  No pleural effusion.  The bony thorax is intact.  IMPRESSION: No acute cardiopulmonary findings.  Original Report Authenticated By: P. MARK GALLERANI, M.D.   Us Abdomen Complete  06/07/2011  *RADIOLOGY REPORT*  Clinical Data:  Right upper quadrant abdominal pain.  History of gallstones.  ABDOMINAL ULTRASOUND COMPLETE  Comparison:  Abdominal ultrasound performed 02/26/2011, and CT of the abdomen and pelvis performed 03/26/2007  Findings:  Gallbladder:  Multiple mobile gallstones are noted within the  gallbladder; the gallbladder is diffusely distended, without evidence of gallbladder wall thickening or pericholecystic fluid to suggest cholecystitis.  A positive ultrasonographic Murphy's sign is elicited.  Given distension of the common hepatic duct, this most likely reflects obstruction.  Common Bile Duct:  Measures 1.2 to 1.3 cm in diameter; the common bile duct measures up to 1.3 cm at the head of the pancreas.  A 0.8 cm stone is noted within the common bile duct at the head of the pancreas, though there may be a more distal obstructing stone.  Liver:  Normal parenchymal echogenicity and echotexture; no focal lesions identified.  Limited Doppler evaluation demonstrates normal blood flow within the liver.  IVC:  Unremarkable in appearance.  Pancreas:  Although the pancreas is difficult to visualize in its entirety due to overlying bowel gas, no focal pancreatic abnormality is identified.  Spleen:  9.8 cm in length; within normal limits in size and echotexture.  Right kidney:  11.0 cm in length; normal in size, configuration and parenchymal echogenicity.  No evidence of mass or hydronephrosis.  Left kidney:  10.9 cm in length; normal in size, configuration and parenchymal echogenicity.  No evidence of mass or hydronephrosis.  Abdominal Aorta:  Normal in caliber; no aneurysm identified.  IMPRESSION: Diffuse distension of the common hepatic duct and common bile duct to 1.3 cm in maximal diameter, with associated significant distension of the gallbladder.  0.8 cm stone noted in the common bile duct at the head of the pancreas,   though there may be a more distal obstructing stone.  Positive ultrasonographic Murphy's sign elicited; this most likely reflects distal obstruction.  No evidence for cholecystitis. Additional mobile stones noted within the gallbladder.  Original Report Authenticated By: JEFFREY CHANG, M.D.    Anti-infectives: Anti-infectives     Start     Dose/Rate Route Frequency Ordered Stop    06/07/11 1000   ciprofloxacin (CIPRO) IVPB 400 mg        400 mg 200 mL/hr over 60 Minutes Intravenous Every 12 hours 06/07/11 0821 06/14/11 0959          Assessment/Plan: s/p Procedure(s) (LRB): ENDOSCOPIC RETROGRADE CHOLANGIOPANCREATOGRAPHY (ERCP) (N/A) For ERCP this afternoon 1. Choledocholithiasis  2. Cholelithiasis  Plan:    Will plan on pursuing surgical intervention after ERCP  Kedrick Mcnamee,PA-C Pager [phone] General Trauma Pager [phone]   

## 2011-06-08 NOTE — Op Note (Signed)
[name] [address] [place], [place]  [address]  ERCP PROCEDURE REPORT  PATIENT:  Roberta, [name]  MR#:  [id number] BIRTHDATE:  [birth year]  GENDER:  female ENDOSCOPIST:  Jeani Hawking, MD ASSISTANT:  Rolanda Lundborg, Felecia Shelling, RN, Loleta Rose. Wilson PROCEDURE DATE:  06/08/2011 PROCEDURE:  ERCP with removal of stones ASA CLASS:  Class II INDICATIONS:  stone  MEDICATIONS:   Fentanyl 100 mcg IV, Versed 11 mg IV, glucagon 2 mg IV TOPICAL ANESTHETIC:  Cetacaine Spray  DESCRIPTION OF PROCEDURE:   After the risks benefits and alternatives of the procedure were thoroughly explained, informed consent was obtained.  The [id number]-[id number] ([id number]) endoscope was introduced through the mouth and advanced to the second portion of the duodenum.  FINDINGS:  The ERCP was difficult to perform. The ampulla was flat making cannulation difficult. Multiple cannulations of the PD occurred. Initially the jagwire was left in the PD to help facilitate CBD cannulation, but this attempt failed. The Al Pimple was removed from the PD and CBD cannulation was attempted. The PD was cannulated again and a decision was made to make a small sphincterotomy in hopes of "unroofing" the ampulla. This allowed for CBD cannulation and then the Jagwire was secured in the right intrahepatic ducts. Contrast injection did reveal a small lucency in the mid CBD and the CBD measured 1.5 cm. A 1.2 cm sphincterotomy was created and there was free flow of bile and sludge. During the first balloon sweep a small stone was extracted two more sweeps were performed and during the third and final sweep an occlusion cholangiogram was performed. No evidence of retained stones on fluroscopy. At this point the procedure was terminated.    The scope was then completely withdrawn from the patient and the procedure terminated. <<PROCEDUREIMAGES>>  COMPLICATIONS:  None  ENDOSCOPIC IMPRESSION: 1) CBD  Stone RECOMMENDATIONS: 1) Keep NPO and monitor for signs/symptoms of Post-ERCP pancreatitis.  ______________________________ Jeani Hawking, MD  n. Rosalie DoctorJeani Hawking at 06/08/2011 05:04 PM  Gaynelle Adu, [phone]

## 2011-06-08 NOTE — Progress Notes (Signed)
Pt admitted with "gallstones". Pt, per rept, is to go for an ERCP this PM and cholecystectomy tomorrow. Pt is NPO for procedure this PM. Pt is alert and oriented x 3. Pt is lethargic related to pain meds but very arousable. Neuro check is negative. Pt ambulates with steady gait. Pt repted yesterday that she had some "burning" with urination. Pt repts today that her urination is more freq today. Lungs CTA. Heart rate regular rate and rhythm. No s/sx cardiac or resp distress and no c/o such. Vital signs stable. Pt denies cough. Pt repts that she has pain in the R upper quadrant of her abdomen. BS hypoactive x 4. Pt repts intermittent nausea and pain that is relieved with ant emetics and pain medications. Pt repts LBM 4/9. Pt denies passing gas. Will continue to monitor.

## 2011-06-08 NOTE — Progress Notes (Signed)
Noted ERCP results Lap chole tomorrow Patient examined and I agree with the assessment and plan  Violeta Gelinas, MD, MPH, FACS Pager: [phone]  06/08/2011 9:31 PM

## 2011-06-08 NOTE — Progress Notes (Signed)
SUBJECTIVE C/o belly pain.   1. Choledocholithiasis     Past Medical History  Diagnosis Date  . Depression   . Gall stones    Current Facility-Administered Medications  Medication Dose Route Frequency Provider Last Rate Last Dose  . acetaminophen (TYLENOL) tablet 650 mg  650 mg Oral Q6H PRN Orchid Glassberg, MD       Or  . acetaminophen (TYLENOL) suppository 650 mg  650 mg Rectal Q6H PRN Remmi Armenteros, MD      . Chlorhexidine Gluconate Cloth 2 % PADS 6 each  6 each Topical Q0600 Conley Canal, MD   6 each at 06/08/11 0617  . ciprofloxacin (CIPRO) IVPB 400 mg  400 mg Intravenous Q12H Leif Loflin, MD   400 mg at 06/08/11 1020  . dextrose 5 %-0.9 % sodium chloride infusion   Intravenous Continuous Shaylynne Lunt, MD 75 mL/hr at 06/08/11 0640    . HYDROmorphone (DILAUDID) injection 1 mg  1 mg Intravenous Q3H PRN Gadge Hermiz, MD   1 mg at 06/08/11 1925  . mupirocin ointment (BACTROBAN) 2 % 1 application  1 application Nasal BID Conley Canal, MD   1 application at 06/08/11 1020  . Omnipaque 350 mg/mL (50 mL) in 0.9% normal saline (50 mL)    PRN Theda Belfast, MD      . ondansetron [facility]) injection 4 mg  4 mg Intravenous Q8H PRN Tuck Dulworth, MD   4 mg at 06/07/11 2058  . pantoprazole (PROTONIX) EC tablet 40 mg  40 mg Oral Q1200 Lorimer Tiberio, MD   40 mg at 06/08/11 1142  . DISCONTD: 0.9 %  sodium chloride infusion   Intravenous Continuous Theda Belfast, MD 20 mL/hr at 06/08/11 1428 500 mL at 06/08/11 1428  . DISCONTD: butamben-tetracaine-benzocaine (CETACAINE) spray    PRN Theda Belfast, MD   2 spray at 06/08/11 1549  . DISCONTD: fentaNYL (SUBLIMAZE) injection    PRN Theda Belfast, MD   25 mcg at 06/08/11 1640  . DISCONTD: glucagon (GLUCAGEN) injection    PRN Theda Belfast, MD   1 mg at 06/08/11 1640  . DISCONTD: midazolam (VERSED) injection    PRN Theda Belfast, MD   1 mg at 06/08/11 1655   Allergies  Allergen Reactions  . Oxycodone-Acetaminophen     REACTION: itch   Active  Problems:  Cholelithiasis with obstruction  UTI (lower urinary tract infection)   Vital signs in last 24 hours: Temp:  [97.7 F (36.5 C)-98.6 F (37 C)] 97.7 F (36.5 C) (04/11 1748) Pulse Rate:  [70-89] 82  (04/11 1748) Resp:  [11-33] 12  (04/11 1748) BP: (91-146)/(44-96) 96/58 mmHg (04/11 1748) SpO2:  [97 %-100 %] 100 % (04/11 1748) Weight change:  Last BM Date: 06/06/11  Intake/Output from previous day: 04/10 0701 - 04/11 0700 In: 1900 [I.V.:1500; IV Piggyback:400] Out: 2 [Urine:2] Intake/Output this shift:    Lab Results:  Basename 06/08/11 0530 06/06/11 2202  WBC 7.7 9.9  HGB 13.3 15.0  HCT 39.2 42.9  PLT 188 209   BMET  Basename 06/08/11 0530 06/06/11 2202  NA 142 138  K 4.0 4.0  CL 108 102  CO2 26 25  GLUCOSE 95 92  BUN 6 11  CREATININE 0.71 0.65  CALCIUM 9.8 10.6*    Studies/Results: X-ray Chest Pa And Lateral   06/07/2011  *RADIOLOGY REPORT*  Clinical Data: Preop for cholecystectomy.  CHEST - 2 VIEW  Comparison: Chest CT 06/11/2009.  Findings: The cardiac silhouette, mediastinal  and hilar contours are within normal limits.  The lungs are clear.  No pleural effusion.  The bony thorax is intact.  IMPRESSION: No acute cardiopulmonary findings.  Original Report Authenticated By: P. Loralie Champagne, M.D.   US Abdomen Complete  06/07/2011  *RADIOLOGY REPORT*  Clinical Data:  Right upper quadrant abdominal pain.  History of gallstones.  ABDOMINAL ULTRASOUND COMPLETE  Comparison:  Abdominal ultrasound performed 02/26/2011, and CT of the abdomen and pelvis performed 03/26/2007  Findings:  Gallbladder:  Multiple mobile gallstones are noted within the gallbladder; the gallbladder is diffusely distended, without evidence of gallbladder wall thickening or pericholecystic fluid to suggest cholecystitis.  A positive ultrasonographic Murphy's sign is elicited.  Given distension of the common hepatic duct, this most likely reflects obstruction.  Common Bile Duct:  Measures  1.2 to 1.3 cm in diameter; the common bile duct measures up to 1.3 cm at the head of the pancreas.  A 0.8 cm stone is noted within the common bile duct at the head of the pancreas, though there may be a more distal obstructing stone.  Liver:  Normal parenchymal echogenicity and echotexture; no focal lesions identified.  Limited Doppler evaluation demonstrates normal blood flow within the liver.  IVC:  Unremarkable in appearance.  Pancreas:  Although the pancreas is difficult to visualize in its entirety due to overlying bowel gas, no focal pancreatic abnormality is identified.  Spleen:  9.8 cm in length; within normal limits in size and echotexture.  Right kidney:  11.0 cm in length; normal in size, configuration and parenchymal echogenicity.  No evidence of mass or hydronephrosis.  Left kidney:  10.9 cm in length; normal in size, configuration and parenchymal echogenicity.  No evidence of mass or hydronephrosis.  Abdominal Aorta:  Normal in caliber; no aneurysm identified.  IMPRESSION: Diffuse distension of the common hepatic duct and common bile duct to 1.3 cm in maximal diameter, with associated significant distension of the gallbladder.  0.8 cm stone noted in the common bile duct at the head of the pancreas, though there may be a more distal obstructing stone.  Positive ultrasonographic Murphy's sign elicited; this most likely reflects distal obstruction.  No evidence for cholecystitis. Additional mobile stones noted within the gallbladder.  Original Report Authenticated By: Tonia Ghent, M.D.   Dg Ercp With Sphincterotomy  06/08/2011  *RADIOLOGY REPORT*  Clinical Data: Gallstones.  ERCP  Comparison:  None.  Technique:  Multiple spot images obtained with the fluoroscopic device and submitted for interpretation post-procedure.  ERCP was performed by Dr. Elnoria Howard  Findings: Four fluoroscopic spot films are submitted for interpretation.  These demonstrate endoscope in the second part of the duodenum with  cannulation of the common bile duct which is dilated, estimated at over 1 cm.  Balloon is inflated in the distal common bile duct.  No filling defect is identified on the final image.  Contrast column continues to opacify the common bile duct.  IMPRESSION: Dilated common bile duct.  These images were submitted for radiologic interpretation only. Please see the procedural report for the amount of contrast and the fluoroscopy time utilized.  Original Report Authenticated By: Andreas Newport, M.D.    Medications: I have reviewed the patient's current medications.   Physical exam GENERAL- alert HEAD- normal atraumatic, no neck masses, normal thyroid, no jvd RESPIRATORY- appears well, vitals normal, no respiratory distress, acyanotic, normal RR, ear and throat exam is normal, neck free of mass or lymphadenopathy, chest clear, no wheezing, crepitations, rhonchi, normal symmetric air entry CVS-  regular rate and rhythm, S1, S2 normal, no murmur, click, rub or gallop ABDOMEN- abdomen is soft. Some tenderness ruq. No rebound. NEURO- Grossly normal EXTREMITIES- extremities normal, atraumatic, no cyanosis or edema  Plan .Choledocholelithiasis with obstruction- Appreciate gi/ccs. S/p ercp. For lap chole in am. .UTI (lower urinary tract infection)- multi bacteria on urine culture. Continue Ciprofloxacin.  Dvt/gi prophylaxis.  Condition fair.      [name] 06/08/2011 7:39 PM Pager: [phone].

## 2011-06-08 NOTE — Progress Notes (Signed)
Pt received via stretcher from endoscopy following ERCP. Pt is lethargic but arousable and was able to stand pivot from stretcher to bed. Pt vital signs stable on return to unit. Pt made comfortable in bed and pt fell back to sleep. Will continue to monitor.

## 2011-06-08 NOTE — Interval H&P Note (Signed)
History and Physical Interval Note:  06/08/2011 3:30 PM  [name]  has presented today for surgery, with the diagnosis of Choledocholthiasis  The various methods of treatment have been discussed with the patient and family. After consideration of risks, benefits and other options for treatment, the patient has consented to  Procedure(s) (LRB): ENDOSCOPIC RETROGRADE CHOLANGIOPANCREATOGRAPHY (ERCP) (N/A) as a surgical intervention .  The patients' history has been reviewed, patient examined, no change in status, stable for surgery.  I have reviewed the patients' chart and labs.  Questions were answered to the patient's satisfaction.     Jamair Cato D

## 2011-06-09 ENCOUNTER — Encounter (HOSPITAL_COMMUNITY): Payer: Self-pay | Admitting: Gastroenterology

## 2011-06-09 ENCOUNTER — Encounter (HOSPITAL_COMMUNITY): Payer: Self-pay | Admitting: Anesthesiology

## 2011-06-09 ENCOUNTER — Encounter (HOSPITAL_COMMUNITY)
Admission: EM | Disposition: A | Discharge status: Home / Self Care | Payer: Self-pay | Source: Home / Self Care | Attending: Internal Medicine

## 2011-06-09 ENCOUNTER — Inpatient Hospital Stay (HOSPITAL_COMMUNITY): Payer: BC Managed Care – PPO | Admitting: Anesthesiology

## 2011-06-09 HISTORY — PX: CHOLECYSTECTOMY: SHX55

## 2011-06-09 LAB — CBC
MCH: 31.9 pg (ref 26.0–34.0)
MCV: 94 fL (ref 78.0–100.0)
Platelets: 177 10*3/uL (ref 150–400)
RDW: 12.3 % (ref 11.5–15.5)

## 2011-06-09 LAB — COMPREHENSIVE METABOLIC PANEL
AST: 21 U/L (ref 0–37)
Albumin: 3.5 g/dL (ref 3.5–5.2)
Calcium: 10 mg/dL (ref 8.4–10.5)
Creatinine, Ser: 0.57 mg/dL (ref 0.50–1.10)
GFR calc non Af Amer: >90 mL/min (ref >90)

## 2011-06-09 SURGERY — LAPAROSCOPIC CHOLECYSTECTOMY
Anesthesia: General | Site: Abdomen | Wound class: Contaminated

## 2011-06-09 MED ORDER — LACTATED RINGERS IV SOLN
INTRAVENOUS | Status: DC | PRN
  Administered 2011-06-09 (×3): via INTRAVENOUS

## 2011-06-09 MED ORDER — PROPOFOL 10 MG/ML IV EMUL
INTRAVENOUS | Status: DC | PRN
  Administered 2011-06-09: 150 mg via INTRAVENOUS

## 2011-06-09 MED ORDER — DROPERIDOL 2.5 MG/ML IJ SOLN
INTRAMUSCULAR | Status: DC | PRN
  Administered 2011-06-09: .6 mg via INTRAVENOUS

## 2011-06-09 MED ORDER — SUCCINYLCHOLINE CHLORIDE 20 MG/ML IJ SOLN
INTRAMUSCULAR | Status: DC | PRN
  Administered 2011-06-09: 100 mg via INTRAVENOUS

## 2011-06-09 MED ORDER — SODIUM CHLORIDE 0.9 % IV SOLN
INTRAVENOUS | Status: DC | PRN
  Administered 2011-06-09: 17:00:00

## 2011-06-09 MED ORDER — VANCOMYCIN HCL 1000 MG IV SOLR
750.0000 mg | Freq: Once | INTRAVENOUS | Status: AC
  Administered 2011-06-09: 750 mg via INTRAVENOUS
  Filled 2011-06-09: qty 750

## 2011-06-09 MED ORDER — SODIUM CHLORIDE 0.9 % IR SOLN
Status: DC | PRN
  Administered 2011-06-09: 2000 mL

## 2011-06-09 MED ORDER — GLYCOPYRROLATE 0.2 MG/ML IJ SOLN
INTRAMUSCULAR | Status: DC | PRN
  Administered 2011-06-09: 0.6 mg via INTRAVENOUS

## 2011-06-09 MED ORDER — HYDROMORPHONE HCL PF 1 MG/ML IJ SOLN
INTRAMUSCULAR | Status: AC
  Filled 2011-06-09: qty 1

## 2011-06-09 MED ORDER — BUPIVACAINE-EPINEPHRINE 0.25% -1:200000 IJ SOLN
INTRAMUSCULAR | Status: DC | PRN
  Administered 2011-06-09: 10 mL

## 2011-06-09 MED ORDER — FENTANYL CITRATE 0.05 MG/ML IJ SOLN
INTRAMUSCULAR | Status: DC | PRN
  Administered 2011-06-09: 150 ug via INTRAVENOUS
  Administered 2011-06-09: 125 ug via INTRAVENOUS
  Administered 2011-06-09: 100 ug via INTRAVENOUS
  Administered 2011-06-09: 125 ug via INTRAVENOUS

## 2011-06-09 MED ORDER — POTASSIUM CHLORIDE 20 MEQ/15ML (10%) PO LIQD
40.0000 meq | Freq: Once | ORAL | Status: AC
  Administered 2011-06-09: 40 meq via ORAL
  Filled 2011-06-09: qty 30

## 2011-06-09 MED ORDER — ONDANSETRON HCL 4 MG/2ML IJ SOLN
INTRAMUSCULAR | Status: DC | PRN
  Administered 2011-06-09: 4 mg via INTRAVENOUS

## 2011-06-09 MED ORDER — MIDAZOLAM HCL 5 MG/5ML IJ SOLN
INTRAMUSCULAR | Status: DC | PRN
  Administered 2011-06-09: 2 mg via INTRAVENOUS

## 2011-06-09 MED ORDER — SODIUM CHLORIDE 0.9 % IR SOLN
Status: DC | PRN
  Administered 2011-06-09: 1000 mL

## 2011-06-09 MED ORDER — VECURONIUM BROMIDE 10 MG IV SOLR
INTRAVENOUS | Status: DC | PRN
  Administered 2011-06-09: 3 mg via INTRAVENOUS

## 2011-06-09 MED ORDER — LACTATED RINGERS IV SOLN
INTRAVENOUS | Status: DC
  Administered 2011-06-09: 14:00:00 via INTRAVENOUS

## 2011-06-09 MED ORDER — POTASSIUM CHLORIDE 10 MEQ/100ML IV SOLN
10.0000 meq | INTRAVENOUS | Status: AC
  Administered 2011-06-09 (×2): 10 meq via INTRAVENOUS
  Filled 2011-06-09 (×2): qty 100

## 2011-06-09 MED ORDER — HYDROCODONE-ACETAMINOPHEN 10-325 MG PO TABS
1.0000 | ORAL_TABLET | Freq: Four times a day (QID) | ORAL | Status: DC | PRN
  Administered 2011-06-09 – 2011-06-11 (×7): 2 via ORAL
  Filled 2011-06-09 (×7): qty 2

## 2011-06-09 MED ORDER — HYDROMORPHONE HCL PF 1 MG/ML IJ SOLN
0.2500 mg | INTRAMUSCULAR | Status: DC | PRN
  Administered 2011-06-09 (×3): 0.5 mg via INTRAVENOUS

## 2011-06-09 MED ORDER — NEOSTIGMINE METHYLSULFATE 1 MG/ML IJ SOLN
INTRAMUSCULAR | Status: DC | PRN
  Administered 2011-06-09: 3 mg via INTRAVENOUS

## 2011-06-09 SURGICAL SUPPLY — 52 items
ADH SKN CLS APL DERMABOND .7 (GAUZE/BANDAGES/DRESSINGS) ×2
ADH SKN CLS LQ APL DERMABOND (GAUZE/BANDAGES/DRESSINGS) ×4
APPLIER CLIP 5 13 M/L LIGAMAX5 (MISCELLANEOUS) ×3
APPLIER CLIP ROT 10 11.4 M/L (STAPLE)
APR CLP MED LRG 11.4X10 (STAPLE)
APR CLP MED LRG 5 ANG JAW (MISCELLANEOUS) ×2
BAG SPEC RTRVL LRG 6X4 10 (ENDOMECHANICALS) ×2
BLADE SURG ROTATE 9660 (MISCELLANEOUS) IMPLANT
CANISTER SUCTION 2500CC (MISCELLANEOUS) ×3 IMPLANT
CHLORAPREP W/TINT 26ML (MISCELLANEOUS) ×3 IMPLANT
CLIP APPLIE 5 13 M/L LIGAMAX5 (MISCELLANEOUS) ×2 IMPLANT
CLIP APPLIE ROT 10 11.4 M/L (STAPLE) IMPLANT
CLOTH BEACON ORANGE TIMEOUT ST (SAFETY) ×3 IMPLANT
COVER MAYO STAND STRL (DRAPES) ×3 IMPLANT
COVER SURGICAL LIGHT HANDLE (MISCELLANEOUS) ×3 IMPLANT
DECANTER SPIKE VIAL GLASS SM (MISCELLANEOUS) ×6 IMPLANT
DERMABOND ADHESIVE PROPEN (GAUZE/BANDAGES/DRESSINGS) ×2
DERMABOND ADVANCED (GAUZE/BANDAGES/DRESSINGS) ×1
DERMABOND ADVANCED .7 DNX12 (GAUZE/BANDAGES/DRESSINGS) ×2 IMPLANT
DERMABOND ADVANCED .7 DNX6 (GAUZE/BANDAGES/DRESSINGS) ×2 IMPLANT
DRAPE C-ARM 42X72 X-RAY (DRAPES) ×3 IMPLANT
DRAPE UTILITY 15X26 W/TAPE STR (DRAPE) ×6 IMPLANT
ELECT REM PT RETURN 9FT ADLT (ELECTROSURGICAL) ×3
ELECTRODE REM PT RTRN 9FT ADLT (ELECTROSURGICAL) ×2 IMPLANT
FILTER SMOKE EVAC LAPAROSHD (FILTER) ×2 IMPLANT
GLOVE BIO SURGEON STRL SZ7.5 (GLOVE) ×2 IMPLANT
GLOVE BIO SURGEON STRL SZ8 (GLOVE) ×5 IMPLANT
GLOVE BIOGEL PI IND STRL 7.5 (GLOVE) ×1 IMPLANT
GLOVE BIOGEL PI IND STRL 8 (GLOVE) ×3 IMPLANT
GLOVE BIOGEL PI INDICATOR 7.5 (GLOVE) ×1
GLOVE BIOGEL PI INDICATOR 8 (GLOVE) ×2
GLOVE ECLIPSE 6.5 STRL STRAW (GLOVE) ×8 IMPLANT
GOWN PREVENTION PLUS XLARGE (GOWN DISPOSABLE) ×5 IMPLANT
GOWN STRL NON-REIN LRG LVL3 (GOWN DISPOSABLE) ×9 IMPLANT
KIT BASIN OR (CUSTOM PROCEDURE TRAY) ×3 IMPLANT
KIT ROOM TURNOVER OR (KITS) ×3 IMPLANT
NS IRRIG 1000ML POUR BTL (IV SOLUTION) ×3 IMPLANT
PAD ARMBOARD 7.5X6 YLW CONV (MISCELLANEOUS) ×6 IMPLANT
POUCH SPECIMEN RETRIEVAL 10MM (ENDOMECHANICALS) ×3 IMPLANT
SCISSORS LAP 5X35 DISP (ENDOMECHANICALS) IMPLANT
SET CHOLANGIOGRAPH 5 50 .035 (SET/KITS/TRAYS/PACK) ×3 IMPLANT
SET IRRIG TUBING LAPAROSCOPIC (IRRIGATION / IRRIGATOR) ×3 IMPLANT
SLEEVE Z-THREAD 5X100MM (TROCAR) ×6 IMPLANT
SPECIMEN JAR SMALL (MISCELLANEOUS) ×3 IMPLANT
SUT VIC AB 4-0 PS2 27 (SUTURE) ×3 IMPLANT
TOWEL OR 17X24 6PK STRL BLUE (TOWEL DISPOSABLE) ×3 IMPLANT
TOWEL OR 17X26 10 PK STRL BLUE (TOWEL DISPOSABLE) ×3 IMPLANT
TRAY LAPAROSCOPIC (CUSTOM PROCEDURE TRAY) ×3 IMPLANT
TROCAR HASSON GELL 12X100 (TROCAR) ×3 IMPLANT
TROCAR Z-THREAD FIOS 11X100 BL (TROCAR) IMPLANT
TROCAR Z-THREAD FIOS 5X100MM (TROCAR) ×7 IMPLANT
WATER STERILE IRR 1000ML POUR (IV SOLUTION) IMPLANT

## 2011-06-09 NOTE — Progress Notes (Signed)
1 Day Post-Op  Subjective: Mild pain RUQ   Objective: Vital signs in last 24 hours: Temp:  [97.7 F (36.5 C)-99.3 F (37.4 C)] 99.3 F (37.4 C) (04/12 0626) Pulse Rate:  [77-90] 90  (04/12 0626) Resp:  [11-33] 16  (04/12 0626) BP: (91-146)/(44-96) 103/66 mmHg (04/12 0626) SpO2:  [97 %-100 %] 100 % (04/12 0626) Last BM Date: 06/06/11  Intake/Output from previous day: 04/11 0701 - 04/12 0700 In: 650 [I.V.:450; IV Piggyback:200] Out: 1 [Urine:1] Intake/Output this shift:    General appearance: alert and cooperative Resp: clear to auscultation bilaterally GI: Soft, tender RUQ without guarding or mass  Lab Results:   Basename 06/09/11 0550 06/08/11 0530  WBC 9.9 7.7  HGB 13.3 13.3  HCT 39.2 39.2  PLT 177 188   BMET  Basename 06/09/11 0550 06/08/11 0530  NA 140 142  K 3.3* 4.0  CL 107 108  CO2 23 26  GLUCOSE 115* 95  BUN 5* 6  CREATININE 0.57 0.71  CALCIUM 10.0 9.8   PT/INR  Basename 06/08/11 0530  LABPROT 14.4  INR 1.10   ABG No results found for this basename: PHART:2,PCO2:2,PO2:2,HCO3:2 in the last 72 hours  Studies/Results: X-ray Chest Pa And Lateral   06/07/2011  *RADIOLOGY REPORT*  Clinical Data: Preop for cholecystectomy.  CHEST - 2 VIEW  Comparison: Chest CT 06/11/2009.  Findings: The cardiac silhouette, mediastinal and hilar contours are within normal limits.  The lungs are clear.  No pleural effusion.  The bony thorax is intact.  IMPRESSION: No acute cardiopulmonary findings.  Original Report Authenticated By: P. Loralie Champagne, M.D.   Dg Ercp With Sphincterotomy  06/08/2011  *RADIOLOGY REPORT*  Clinical Data: Gallstones.  ERCP  Comparison:  None.  Technique:  Multiple spot images obtained with the fluoroscopic device and submitted for interpretation post-procedure.  ERCP was performed by Dr. Elnoria Howard  Findings: Four fluoroscopic spot films are submitted for interpretation.  These demonstrate endoscope in the second part of the duodenum with cannulation  of the common bile duct which is dilated, estimated at over 1 cm.  Balloon is inflated in the distal common bile duct.  No filling defect is identified on the final image.  Contrast column continues to opacify the common bile duct.  IMPRESSION: Dilated common bile duct.  These images were submitted for radiologic interpretation only. Please see the procedural report for the amount of contrast and the fluoroscopy time utilized.  Original Report Authenticated By: Andreas Newport, M.D.    Anti-infectives: Anti-infectives     Start     Dose/Rate Route Frequency Ordered Stop   06/09/11 0830   vancomycin (VANCOCIN) 750 mg in sodium chloride 0.9 % 150 mL IVPB        750 mg 150 mL/hr over 60 Minutes Intravenous  Once 06/09/11 0823     06/07/11 1000   ciprofloxacin (CIPRO) IVPB 400 mg        400 mg 200 mL/hr over 60 Minutes Intravenous Every 12 hours 06/07/11 0821 06/14/11 0959          Assessment/Plan: s/p Procedure(s) (LRB): ENDOSCOPIC RETROGRADE CHOLANGIOPANCREATOGRAPHY (ERCP) (N/A) For lap chole /IOC today.  MRSA swab + so will give Vanco on call.  Suppliment K+ I discussed the procedure in detail.  The patient was given Agricultural engineer.  We discussed the risks and benefits of a laparoscopic cholecystectomy and possible cholangiogram including, but not limited to bleeding, infection, injury to surrounding structures such as the intestine or liver, bile leak, retained gallstones, need  to convert to an open procedure, prolonged diarrhea, blood clots such as  DVT, common bile duct injury, anesthesia risks, and possible need for additional procedures.  The likelihood of improvement in symptoms and return to the patient's normal status is good. We discussed the typical post-operative recovery course.   LOS: 3 days    Laurali Goddard E 06/09/2011

## 2011-06-09 NOTE — Anesthesia Preprocedure Evaluation (Addendum)
Anesthesia Evaluation  Patient identified by MRN, date of birth, ID band Patient awake    Reviewed: Allergy & Precautions, H&P , NPO status , Patient's Chart, lab work & pertinent test results  History of Anesthesia Complications Negative for: history of anesthetic complications  Airway Mallampati: II TM Distance: >3 FB Neck ROM: Full    Dental No notable dental hx. (+) Teeth Intact and Dental Advisory Given   Pulmonary neg pulmonary ROS,  breath sounds clear to auscultation  Pulmonary exam normal       Cardiovascular negative cardio ROS  Rhythm:Regular Rate:Normal  '09 ECHO: normal LVF, EF 55%, valves OK   Neuro/Psych negative neurological ROS     GI/Hepatic GERD-  Controlled,Acute cholecystitis Nausea with Chole   Endo/Other  negative endocrine ROS  Renal/GU negative Renal ROS     Musculoskeletal negative musculoskeletal ROS (+)   Abdominal (+) + obese,   Peds  Hematology negative hematology ROS (+)   Anesthesia Other Findings   Reproductive/Obstetrics No periods, has contraceptive implant, 06/06/11 preg test was NEG                         Anesthesia Physical Anesthesia Plan  ASA: II  Anesthesia Plan: General   Post-op Pain Management:    Induction: Intravenous  Airway Management Planned: Oral ETT  Additional Equipment:   Intra-op Plan:   Post-operative Plan: Extubation in OR  Informed Consent: I have reviewed the patients History and Physical, chart, labs and discussed the procedure including the risks, benefits and alternatives for the proposed anesthesia with the patient or authorized representative who has indicated his/her understanding and acceptance.   Dental advisory given  Plan Discussed with: Surgeon and CRNA  Anesthesia Plan Comments: (Plan routine monitors, GETA)        Anesthesia Quick Evaluation

## 2011-06-09 NOTE — Interval H&P Note (Signed)
History and Physical Interval Note: Please also see my progress note from today.  06/09/2011 8:26 AM  [name]  has presented today for surgery, with the diagnosis of cholelcystitis  The various methods of treatment have been discussed with the patient and family. After consideration of risks, benefits and other options for treatment, the patient has consented to  Procedure(s) (LRB): LAPAROSCOPIC CHOLECYSTECTOMY WITH INTRAOPERATIVE CHOLANGIOGRAM (N/A) as a surgical intervention .  The patients' history has been reviewed, patient re-examined, no change in status, stable for surgery.  I have reviewed the patients' chart and labs.  Questions were answered to the patient's satisfaction.     Allina Riches E

## 2011-06-09 NOTE — Transfer of Care (Signed)
Immediate Anesthesia Transfer of Care Note  Patient: [name]  Procedure(s) Performed: Procedure(s) (LRB): LAPAROSCOPIC CHOLECYSTECTOMY (N/A)  Patient Location: PACU  Anesthesia Type: General  Level of Consciousness: awake  Airway & Oxygen Therapy: Patient Spontanous Breathing and Patient connected to nasal cannula oxygen  Post-op Assessment: Report given to PACU RN and Post -op Vital signs reviewed and stable  Post vital signs: Reviewed and stable  Complications: No apparent anesthesia complications

## 2011-06-09 NOTE — Discharge Instructions (Signed)
[facility] ______[facility] °LAPAROSCOPIC SURGERY: POST OP INSTRUCTIONS °Always review your discharge instruction sheet given to you by the facility where your surgery was performed. °IF YOU HAVE DISABILITY OR FAMILY LEAVE FORMS, YOU MUST BRING THEM TO THE OFFICE FOR PROCESSING.   °DO NOT GIVE THEM TO YOUR DOCTOR. ° °1. A prescription for pain medication may be given to you upon discharge.  Take your pain medication as prescribed, if needed.  If narcotic pain medicine is not needed, then you may take acetaminophen (Tylenol) or ibuprofen (Advil) as needed. °2. Take your usually prescribed medications unless otherwise directed. °3. If you need a refill on your pain medication, please contact your pharmacy.  They will contact our office to request authorization. Prescriptions will not be filled after 5pm or on week-ends. °4. You should follow a light diet the first few days after arrival home, such as soup and crackers, etc.  Be sure to include lots of fluids daily. °5. Most patients will experience some swelling and bruising in the area of the incisions.  Ice packs will help.  Swelling and bruising can take several days to resolve.  °6. It is common to experience some constipation if taking pain medication after surgery.  Increasing fluid intake and taking a stool softener (such as Colace) will usually help or prevent this problem from occurring.  A mild laxative (Milk of Magnesia or Miralax) should be taken according to package instructions if there are no bowel movements after 48 hours. °7. Unless discharge instructions indicate otherwise, you may remove your bandages 24-48 hours after surgery, and you may shower at that time.  You may have steri-strips (small skin tapes) in place directly over the incision.  These strips should be left on the skin for 7-10 days.  If your surgeon used skin glue on the incision, you may shower in 24 hours.  The glue will flake off over the next 2-3 weeks.  Any sutures or  staples will be removed at the office during your follow-up visit. °8. ACTIVITIES:  You may resume regular (light) daily activities beginning the next day--such as daily self-care, walking, climbing stairs--gradually increasing activities as tolerated.  You may have sexual intercourse when it is comfortable.  Refrain from any heavy lifting or straining until approved by your doctor. °a. You may drive when you are no longer taking prescription pain medication, you can comfortably wear a seatbelt, and you can safely maneuver your car and apply brakes. °b. RETURN TO WORK:  __________________________________________________________ °9. You should see your doctor in the office for a follow-up appointment approximately 2-3 weeks after your surgery.  Make sure that you call for this appointment within a day or two after you arrive home to insure a convenient appointment time. °10. OTHER INSTRUCTIONS: __________________________________________________________________________________________________________________________ __________________________________________________________________________________________________________________________ °WHEN TO CALL YOUR DOCTOR: °1. Fever over 101.0 °2. Inability to urinate °3. Continued bleeding from incision. °4. Increased pain, redness, or drainage from the incision. °5. Increasing abdominal pain ° °The clinic staff is available to answer your questions during regular business hours.  Please don’t hesitate to call and ask to speak to one of the nurses for clinical concerns.  If you have a medical emergency, go to the nearest emergency room or call 911.  A surgeon from [facility] is always on call at the hospital. °[address], [address], [place], [place]  [address] ? [address] [address], [place], [place]   [address] °[phone] ? [phone] ? FAX [phone] °Web site:   [email or web address] °

## 2011-06-09 NOTE — Progress Notes (Signed)
Pt is s/p ERCP. Pt plan for the day is lap chole. Pt ambulates with steady gait but decreased ROM abdomen related to abdominal pain. BS+x4 but faint. Pt repts passing gas and denies vomiting. Pt repts LBM 4/9. Pt does rept intermittent nausea that is relieved with meds. Pt voids quantity sufficient but repts urgency. Pt is receiving Cipro IV per order. Lungs CTA. Heart rate regular rate and rhythm. No s/sx cardiac or resp distress and no c/o such. Vital signs are stable. Pt is alert and oriented x 3 and neuro check is negative but pt noted to have a flat affect.

## 2011-06-09 NOTE — Anesthesia Postprocedure Evaluation (Signed)
  Anesthesia Post-op Note  Patient: [name]  Procedure(s) Performed: Procedure(s) (LRB): LAPAROSCOPIC CHOLECYSTECTOMY (N/A)  Patient Location: PACU  Anesthesia Type: General  Level of Consciousness: awake, alert  and oriented  Airway and Oxygen Therapy: Patient Spontanous Breathing and Patient connected to nasal cannula oxygen  Post-op Pain: mild  Post-op Assessment: Post-op Vital signs reviewed, Patient's Cardiovascular Status Stable, Respiratory Function Stable, Patent Airway, No signs of Nausea or vomiting and Pain level controlled  Post-op Vital Signs: Reviewed and stable  Complications: No apparent anesthesia complications

## 2011-06-09 NOTE — Progress Notes (Signed)
OR called to take pt down for surgery. Pt L forearm noted to be swollen at the antecubital site with a dotty red rash. Pt repts that she didn't note the area to be tender until she got up to go to the bathroom upon preparation to go to OR. Pt now repts site is "slightly tender". IV removed with tip intact and dressing applied. This is pt's second site this AM due to infiltration. Pt was receiving IV runs of K and Cipro per order. OR notified.

## 2011-06-09 NOTE — Anesthesia Procedure Notes (Signed)
Procedure Name: Intubation Date/Time: 06/09/2011 4:21 PM Performed by: Alanda Amass A Pre-anesthesia Checklist: Patient identified, Emergency Drugs available, Timeout performed, Suction available and Patient being monitored Patient Re-evaluated:Patient Re-evaluated prior to inductionOxygen Delivery Method: Circle system utilized Preoxygenation: Pre-oxygenation with 100% oxygen Intubation Type: IV induction, Rapid sequence and Cricoid Pressure applied Laryngoscope Size: Mac and 3 Grade View: Grade II Tube type: Oral Tube size: 7.5 mm Number of attempts: 1 Airway Equipment and Method: Stylet Secured at: 21 cm Tube secured with: Tape Dental Injury: Teeth and Oropharynx as per pre-operative assessment

## 2011-06-09 NOTE — Op Note (Signed)
06/06/2011 - 06/09/2011  5:16 PM  PATIENT:  [name]  46 y.o. female  PRE-OPERATIVE DIAGNOSIS:  Cholelcystitis, recent choledocholithiasis  POST-OPERATIVE DIAGNOSIS:  Cholecystitis, recent choledocholithiasis  PROCEDURE:  Procedure(s): LAPAROSCOPIC CHOLECYSTECTOMY  SURGEON:  Surgeon(s): Liz Malady, MD Ardeth Sportsman, MD  PHYSICIAN ASSISTANT:   ASSISTANTS: Karie Soda, MD  ANESTHESIA:   general  EBL:  Total I/O In: 2000 [I.V.:2000] Out: -   BLOOD ADMINISTERED:none  DRAINS: none   SPECIMEN:  Excision  DISPOSITION OF SPECIMEN:  PATHOLOGY  COUNTS:  YES  DICTATION: .Dragon Dictation patient was admitted with choledocholithiasis. She underwent ERCP yesterday with stone removal and sphincterotomy. She presents for cholecystectomy. Patient was identified in the preop holding area. She is on antibiotic protocol intravenously. Informed consent was obtained. She was brought to the operating room and general endotracheal anesthesia was administered by the anesthesia staff. Abdomen was prepped and draped in sterile fashion. Time out procedure was done. Infraumbilical region was infiltrated with quarter percent Marcaine with epinephrine. Infraumbilical incision was made. Subcutaneous tissues were dissected down revealing the anterior fascia. This was sharply along the midline. Peritoneal cavity was entered under direct vision. 0 Vicryl purse string suture was placed on the fascial opening. Hassan trocar was inserted into the abdomen. Abdomen was insufflated with carbon dioxide in standard fashion. Under direct vision a 5 mm epigastric and 2 right-sided 5 mm ports were placed. Local was used at each port sites as well. Laparoscopic exploration revealed a distended and inflamed gallbladder. Filmy omental adhesions on the body and infundibulum. These were gradually swept down. Hemostasis was obtained where the adhesions were also attached the liver and they were removed. The dome of the  gallbladder was retracted superior medially. The infundibulum was then retracted inferior laterally. Dissection laterally and progressed medially easily identifying the cystic duct. This continued until we obtained the critical view between the cystic duct, infundibulum, and the the liver. There was excellent visualization. In light of the patient's ERCP, cholangiogram was not done. 3 clips were placed proximal the cystic duct one clip was placed distally and it was divided. Cystic artery was dissected out. This was clipped twice proximally and once distally and was divided., And was taken off the liver bed with Bovie cautery achieving excellent hemostasis along way. Gallbladder was placed in Endo Catch bag. It was removed from the infraumbilical port site. Abdomen was copiously irrigated liver bed was dry and clips remained in excellent position. Irrigation fluid returned clear. Ports were removed under direct vision. Pneumoperitoneum was released. Infraumbilical fascia was closed by tying the 0 Vicryl pressuring suture with care not to trap the intraperitoneal contents. All 4 wounds were copiously irrigated and the skin of each was closed with running 4-0 Vicryl subcuticular stitch followed by Dermabond. All counts were correct. Patient tolerated procedure well without apparent complication and was taken to recovery room in stable condition  PATIENT DISPOSITION:  PACU - hemodynamically stable.   Delay start of Pharmacological VTE agent (>24hrs) due to surgical blood loss or risk of bleeding:  no  Violeta Gelinas, MD, MPH, FACS Pager: [phone]  4/12/20135:16 PM

## 2011-06-09 NOTE — Progress Notes (Signed)
SUBJECTIVE "Iam hurting."   1. Choledocholithiasis     Past Medical History  Diagnosis Date  . Depression   . Gall stones    Current Facility-Administered Medications  Medication Dose Route Frequency Provider Last Rate Last Dose  . acetaminophen (TYLENOL) tablet 650 mg  650 mg Oral Q6H PRN Michaelyn Wall, MD       Or  . acetaminophen (TYLENOL) suppository 650 mg  650 mg Rectal Q6H PRN Chazlyn Cude, MD      . Chlorhexidine Gluconate Cloth 2 % PADS 6 each  6 each Topical Q0600 Conley Canal, MD   6 each at 06/08/11 0617  . ciprofloxacin (CIPRO) IVPB 400 mg  400 mg Intravenous Q12H Artrice Kraker, MD   400 mg at 06/09/11 1005  . dextrose 5 %-0.9 % sodium chloride infusion   Intravenous Continuous Jinnie Onley, MD 75 mL/hr at 06/09/11 0217    . HYDROcodone-acetaminophen (NORCO) 10-325 MG per tablet 1-2 tablet  1-2 tablet Oral Q6H PRN Liz Malady, MD      . HYDROmorphone (DILAUDID) 1 MG/ML injection           . HYDROmorphone (DILAUDID) injection 0.25-0.5 mg  0.25-0.5 mg Intravenous Q5 min PRN E. Jairo Ben, MD   0.5 mg at 06/09/11 1803  . HYDROmorphone (DILAUDID) injection 1 mg  1 mg Intravenous Q3H PRN Tyona Nilsen, MD   1 mg at 06/09/11 0812  . mupirocin ointment (BACTROBAN) 2 % 1 application  1 application Nasal BID Conley Canal, MD   1 application at 06/09/11 1006  . ondansetron (ZOFRAN) injection 4 mg  4 mg Intravenous Q8H PRN Kihanna Kamiya, MD   4 mg at 06/07/11 2058  . pantoprazole (PROTONIX) EC tablet 40 mg  40 mg Oral Q1200 Eriona Kinchen, MD   40 mg at 06/09/11 1159  . potassium chloride 10 mEq in 100 mL IVPB  10 mEq Intravenous Q1 Hr x 2 Liz Malady, MD   10 mEq at 06/09/11 1045  . potassium chloride 20 MEQ/15ML (10%) liquid 40 mEq  40 mEq Oral Once Baylyn Sickles, MD      . vancomycin (VANCOCIN) 750 mg in sodium chloride 0.9 % 150 mL IVPB  750 mg Intravenous Once Liz Malady, MD   750 mg at 06/09/11 1616  . DISCONTD: bupivacaine-EPINEPHrine (MARCAINE W/  EPI) 0.25 % (with pres) injection    PRN Liz Malady, MD   10 mL at 06/09/11 1709  . DISCONTD: lactated ringers infusion   Intravenous Continuous Kipp Brood, MD 20 mL/hr at 06/09/11 1341    . DISCONTD: Omnipaque 300 mg/mL (50 mL) in 0.9% normal saline (50 mL)    PRN Liz Malady, MD      . DISCONTD: Omnipaque 350 mg/mL (50 mL) in 0.9% normal saline (50 mL)    PRN Theda Belfast, MD      . DISCONTD: sodium chloride irrigation 0.9 %    PRN Liz Malady, MD   1,000 mL at 06/09/11 1646  . DISCONTD: sodium chloride irrigation 0.9 %    PRN Liz Malady, MD   2,000 mL at 06/09/11 1648   Facility-Administered Medications Ordered in Other Encounters  Medication Dose Route Frequency Provider Last Rate Last Dose  . DISCONTD: droperidol (INAPSINE) injection    PRN Atilano Ina, CRNA   0.6 mg at 06/09/11 1654  . DISCONTD: fentaNYL (SUBLIMAZE) injection    PRN Atilano Ina, CRNA   125 mcg at 06/09/11 1701  .  DISCONTD: glycopyrrolate (ROBINUL) injection    PRN Atilano Ina, CRNA   0.6 mg at 06/09/11 1707  . DISCONTD: lactated ringers infusion    Continuous PRN Atilano Ina, CRNA      . DISCONTD: midazolam (VERSED) 5 MG/5ML injection    PRN Atilano Ina, CRNA   2 mg at 06/09/11 1610  . DISCONTD: neostigmine (PROSTIGMINE) injection   Intravenous PRN Atilano Ina, CRNA   3 mg at 06/09/11 1707  . DISCONTD: ondansetron (ZOFRAN) injection    PRN Atilano Ina, CRNA   4 mg at 06/09/11 1706  . DISCONTD: propofol (DIPRIVAN) 10 MG/ML infusion    PRN Atilano Ina, CRNA   150 mg at 06/09/11 1620  . DISCONTD: succinylcholine (ANECTINE) injection    PRN Atilano Ina, CRNA   100 mg at 06/09/11 1620  . DISCONTD: vecuronium (NORCURON) injection    PRN Atilano Ina, CRNA   3 mg at 06/09/11 1629   Allergies  Allergen Reactions  . Oxycodone-Acetaminophen     REACTION: itch   Active Problems:  Cholelithiasis with obstruction  UTI (lower urinary tract infection)   Vital signs  in last 24 hours: Temp:  [97.3 F (36.3 C)-99.3 F (37.4 C)] 97.3 F (36.3 C) (04/12 1846) Pulse Rate:  [70-98] 72  (04/12 1846) Resp:  [10-22] 16  (04/12 1846) BP: (92-142)/(49-71) 119/71 mmHg (04/12 1846) SpO2:  [98 %-100 %] 98 % (04/12 1846) Weight change:  Last BM Date: 06/06/11  Intake/Output from previous day: 04/11 0701 - 04/12 0700 In: 650 [I.V.:450; IV Piggyback:200] Out: 1 [Urine:1] Intake/Output this shift:    Lab Results:  Basename 06/09/11 0550 06/08/11 0530  WBC 9.9 7.7  HGB 13.3 13.3  HCT 39.2 39.2  PLT 177 188   BMET  Basename 06/09/11 0550 06/08/11 0530  NA 140 142  K 3.3* 4.0  CL 107 108  CO2 23 26  GLUCOSE 115* 95  BUN 5* 6  CREATININE 0.57 0.71  CALCIUM 10.0 9.8    Studies/Results: Dg Ercp With Sphincterotomy  06/08/2011  *RADIOLOGY REPORT*  Clinical Data: Gallstones.  ERCP  Comparison:  None.  Technique:  Multiple spot images obtained with the fluoroscopic device and submitted for interpretation post-procedure.  ERCP was performed by Dr. Elnoria Howard  Findings: Four fluoroscopic spot films are submitted for interpretation.  These demonstrate endoscope in the second part of the duodenum with cannulation of the common bile duct which is dilated, estimated at over 1 cm.  Balloon is inflated in the distal common bile duct.  No filling defect is identified on the final image.  Contrast column continues to opacify the common bile duct.  IMPRESSION: Dilated common bile duct.  These images were submitted for radiologic interpretation only. Please see the procedural report for the amount of contrast and the fluoroscopy time utilized.  Original Report Authenticated By: Andreas Newport, M.D.    Medications: I have reviewed the patient's current medications.   Physical exam GENERAL- alert HEAD- normal atraumatic, no neck masses, normal thyroid, no jvd RESPIRATORY- appears well, vitals normal, no respiratory distress, acyanotic, normal RR, ear and throat exam is  normal, neck free of mass or lymphadenopathy, chest clear, no wheezing, crepitations, rhonchi, normal symmetric air entry CVS- regular rate and rhythm, S1, S2 normal, no murmur, click, rub or gallop ABDOMEN-surgical marks, no bleeding. +BS. Some tenderness to palpation. NEURO- Grossly normal EXTREMITIES- extremities normal, atraumatic, no cyanosis or edema  Plan .Choledocholelithiasis with obstruction- Appreciate gi/ccs. S/p  ercp/lap chole. Doing well. Marland KitchenUTI (lower urinary tract infection)- multi bacteria on urine culture. Continue Ciprofloxacin Hypokalemia- due to poor oral intake, replenish. Dvt/gi prophylaxis.  Condition fair.      [name] 06/09/2011 7:04 PM Pager: [phone].

## 2011-06-09 NOTE — H&P (View-Only) (Signed)
Subjective: Some c/o abd pain today.  For ERCP this afternoon   Objective: Vital signs in last 24 hours: Temp:  [97.7 F (36.5 C)-98.5 F (36.9 C)] 97.7 F (36.5 C) (04/11 0609) Pulse Rate:  [70-89] 89  (04/11 0609) Resp:  [18] 18  (04/11 0609) BP: (97-120)/(51-83) 100/60 mmHg (04/11 0620) SpO2:  [97 %-100 %] 99 % (04/11 0609) Last BM Date: 06/06/11  Intake/Output from previous day: 04/10 0701 - 04/11 0700 In: 1900 [I.V.:1500; IV Piggyback:400] Out: 2 [Urine:2] Intake/Output this shift: Total I/O In: -  Out: 1 [Urine:1]  General appearance: alert, cooperative and mild distress Resp: clear to auscultation bilaterally GI: abnormal findings:  +BS, mildly tender RUQ, soft   Lab Results:   [facility] 06/08/11 0530 06/06/11 2202  WBC 7.7 9.9  HGB 13.3 15.0  HCT 39.2 42.9  PLT 188 209   BMET  Basename 06/08/11 0530 06/06/11 2202  NA 142 138  K 4.0 4.0  CL 108 102  CO2 26 25  GLUCOSE 95 92  BUN 6 11  CREATININE 0.71 0.65  CALCIUM 9.8 10.6*   PT/INR  Basename 06/08/11 0530  LABPROT 14.4  INR 1.10   ABG No results found for this basename: PHART:2,PCO2:2,PO2:2,HCO3:2 in the last 72 hours  Studies/Results: X-ray Chest Pa And Lateral   06/07/2011  *RADIOLOGY REPORT*  Clinical Data: Preop for cholecystectomy.  CHEST - 2 VIEW  Comparison: Chest CT 06/11/2009.  Findings: The cardiac silhouette, mediastinal and hilar contours are within normal limits.  The lungs are clear.  No pleural effusion.  The bony thorax is intact.  IMPRESSION: No acute cardiopulmonary findings.  Original Report Authenticated By: P. Loralie Champagne, M.D.   US Abdomen Complete  06/07/2011  *RADIOLOGY REPORT*  Clinical Data:  Right upper quadrant abdominal pain.  History of gallstones.  ABDOMINAL ULTRASOUND COMPLETE  Comparison:  Abdominal ultrasound performed 02/26/2011, and CT of the abdomen and pelvis performed 03/26/2007  Findings:  Gallbladder:  Multiple mobile gallstones are noted within the  gallbladder; the gallbladder is diffusely distended, without evidence of gallbladder wall thickening or pericholecystic fluid to suggest cholecystitis.  A positive ultrasonographic Murphy's sign is elicited.  Given distension of the common hepatic duct, this most likely reflects obstruction.  Common Bile Duct:  Measures 1.2 to 1.3 cm in diameter; the common bile duct measures up to 1.3 cm at the head of the pancreas.  A 0.8 cm stone is noted within the common bile duct at the head of the pancreas, though there may be a more distal obstructing stone.  Liver:  Normal parenchymal echogenicity and echotexture; no focal lesions identified.  Limited Doppler evaluation demonstrates normal blood flow within the liver.  IVC:  Unremarkable in appearance.  Pancreas:  Although the pancreas is difficult to visualize in its entirety due to overlying bowel gas, no focal pancreatic abnormality is identified.  Spleen:  9.8 cm in length; within normal limits in size and echotexture.  Right kidney:  11.0 cm in length; normal in size, configuration and parenchymal echogenicity.  No evidence of mass or hydronephrosis.  Left kidney:  10.9 cm in length; normal in size, configuration and parenchymal echogenicity.  No evidence of mass or hydronephrosis.  Abdominal Aorta:  Normal in caliber; no aneurysm identified.  IMPRESSION: Diffuse distension of the common hepatic duct and common bile duct to 1.3 cm in maximal diameter, with associated significant distension of the gallbladder.  0.8 cm stone noted in the common bile duct at the head of the pancreas,  though there may be a more distal obstructing stone.  Positive ultrasonographic Murphy's sign elicited; this most likely reflects distal obstruction.  No evidence for cholecystitis. Additional mobile stones noted within the gallbladder.  Original Report Authenticated By: Tonia Ghent, M.D.    Anti-infectives: Anti-infectives     Start     Dose/Rate Route Frequency Ordered Stop    06/07/11 1000   ciprofloxacin (CIPRO) IVPB 400 mg        400 mg 200 mL/hr over 60 Minutes Intravenous Every 12 hours 06/07/11 0821 06/14/11 0959          Assessment/Plan: s/p Procedure(s) (LRB): ENDOSCOPIC RETROGRADE CHOLANGIOPANCREATOGRAPHY (ERCP) (N/A) For ERCP this afternoon 1. Choledocholithiasis  2. Cholelithiasis  Plan:    Will plan on pursuing surgical intervention after ERCP  Arlee Santosuosso,PA-C Pager [phone] General Trauma Pager [phone]

## 2011-06-10 LAB — COMPREHENSIVE METABOLIC PANEL
AST: 58 U/L — ABNORMAL HIGH (ref 0–37)
Albumin: 3.1 g/dL — ABNORMAL LOW (ref 3.5–5.2)
BUN: 4 mg/dL — ABNORMAL LOW (ref 6–23)
Calcium: 9.8 mg/dL (ref 8.4–10.5)
Chloride: 110 mEq/L (ref 96–112)
Creatinine, Ser: 0.58 mg/dL (ref 0.50–1.10)
Total Bilirubin: 0.4 mg/dL (ref 0.3–1.2)

## 2011-06-10 LAB — CBC
HCT: 38.4 % (ref 36.0–46.0)
MCH: 31.9 pg (ref 26.0–34.0)
MCHC: 33.6 g/dL (ref 30.0–36.0)
MCV: 95 fL (ref 78.0–100.0)
Platelets: 172 10*3/uL (ref 150–400)
RDW: 12.5 % (ref 11.5–15.5)
WBC: 8.9 10*3/uL (ref 4.0–10.5)

## 2011-06-10 MED ORDER — CIPROFLOXACIN HCL 500 MG PO TABS
500.0000 mg | ORAL_TABLET | Freq: Two times a day (BID) | ORAL | Status: DC
  Administered 2011-06-10 – 2011-06-11 (×3): 500 mg via ORAL
  Filled 2011-06-10 (×5): qty 1

## 2011-06-10 MED ORDER — POTASSIUM CHLORIDE 20 MEQ/15ML (10%) PO LIQD
40.0000 meq | Freq: Once | ORAL | Status: AC
  Administered 2011-06-10: 40 meq via ORAL
  Filled 2011-06-10: qty 30

## 2011-06-10 NOTE — Progress Notes (Signed)
F/U information given Patient examined and I agree with the assessment and plan  Violeta Gelinas, MD, MPH, FACS Pager: [phone]  06/10/2011 3:55 PM

## 2011-06-10 NOTE — Progress Notes (Signed)
1 Day Post-Op  Subjective: C/o abd soreness diffusely, but no N/V and tolerating some po's .  Objective: Vital signs in last 24 hours: Temp:  [97.3 F (36.3 C)-97.9 F (36.6 C)] 97.3 F (36.3 C) (04/13 0605) Pulse Rate:  [70-93] 83  (04/13 0605) Resp:  [10-22] 20  (04/13 0605) BP: (86-142)/(49-71) 94/60 mmHg (04/13 0613) SpO2:  [98 %-100 %] 99 % (04/13 0605) Last BM Date: 06/06/11  Intake/Output from previous day: 04/12 0701 - 04/13 0700 In: 3700 [I.V.:3500; IV Piggyback:200] Out: 25 [Blood:25] Intake/Output this shift:    General appearance: alert, cooperative and mild distress Resp: clear to auscultation bilaterally Cardio: regular rate and rhythm GI: soft,, bowel sounds hypoactive, mildly tender about ports  Lab Results:   Basename 06/10/11 0500 06/09/11 0550  WBC 8.9 9.9  HGB 12.9 13.3  HCT 38.4 39.2  PLT 172 177   BMET  Basename 06/10/11 0500 06/09/11 0550  NA 143 140  K 3.4* 3.3*  CL 110 107  CO2 25 23  GLUCOSE 94 115*  BUN 4* 5*  CREATININE 0.58 0.57  CALCIUM 9.8 10.0   PT/INR  Basename 06/08/11 0530  LABPROT 14.4  INR 1.10   ABG No results found for this basename: PHART:2,PCO2:2,PO2:2,HCO3:2 in the last 72 hours  Studies/Results: Dg Ercp With Sphincterotomy  06/08/2011  *RADIOLOGY REPORT*  Clinical Data: Gallstones.  ERCP  Comparison:  None.  Technique:  Multiple spot images obtained with the fluoroscopic device and submitted for interpretation post-procedure.  ERCP was performed by Dr. Elnoria Howard  Findings: Four fluoroscopic spot films are submitted for interpretation.  These demonstrate endoscope in the second part of the duodenum with cannulation of the common bile duct which is dilated, estimated at over 1 cm.  Balloon is inflated in the distal common bile duct.  No filling defect is identified on the final image.  Contrast column continues to opacify the common bile duct.  IMPRESSION: Dilated common bile duct.  These images were submitted for  radiologic interpretation only. Please see the procedural report for the amount of contrast and the fluoroscopy time utilized.  Original Report Authenticated By: Andreas Newport, M.D.    Anti-infectives: Anti-infectives     Start     Dose/Rate Route Frequency Ordered Stop   06/10/11 1130   ciprofloxacin (CIPRO) tablet 500 mg        500 mg Oral 2 times daily 06/10/11 1018 06/14/11 0759   06/09/11 1000   vancomycin (VANCOCIN) 750 mg in sodium chloride 0.9 % 150 mL IVPB        750 mg 150 mL/hr over 60 Minutes Intravenous  Once 06/09/11 0823 06/09/11 1616   06/07/11 1000   ciprofloxacin (CIPRO) IVPB 400 mg  Status:  Discontinued        400 mg 200 mL/hr over 60 Minutes Intravenous Every 12 hours 06/07/11 0821 06/10/11 1018          Assessment/Plan: s/p Procedure(s) (LRB): LAPAROSCOPIC CHOLECYSTECTOMY (N/A) Stable post op course Stable to DC home from surgery standpoint  Continues ABX therapy due to UTI- Cipro  LOS: 4 days   Rayola Everhart,PA-C Pager [phone] General Trauma Pager [phone]

## 2011-06-10 NOTE — Progress Notes (Signed)
SUBJECTIVE Feels ok. Pain less. Passed flatus.   1. Choledocholithiasis     Past Medical History  Diagnosis Date  . Depression   . Gall stones    Current Facility-Administered Medications  Medication Dose Route Frequency Provider Last Rate Last Dose  . acetaminophen (TYLENOL) tablet 650 mg  650 mg Oral Q6H PRN Kaile Bixler, MD       Or  . acetaminophen (TYLENOL) suppository 650 mg  650 mg Rectal Q6H PRN Neoma Uhrich, MD      . Chlorhexidine Gluconate Cloth 2 % PADS 6 each  6 each Topical Q0600 Conley Canal, MD   6 each at 06/10/11 0615  . ciprofloxacin (CIPRO) IVPB 400 mg  400 mg Intravenous Q12H Eilee Schader, MD   400 mg at 06/09/11 2143  . HYDROcodone-acetaminophen (NORCO) 10-325 MG per tablet 1-2 tablet  1-2 tablet Oral Q6H PRN Liz Malady, MD   2 tablet at 06/10/11 0320  . HYDROmorphone (DILAUDID) 1 MG/ML injection           . HYDROmorphone (DILAUDID) injection 0.25-0.5 mg  0.25-0.5 mg Intravenous Q5 min PRN E. Jairo Ben, MD   0.5 mg at 06/09/11 1803  . HYDROmorphone (DILAUDID) injection 1 mg  1 mg Intravenous Q3H PRN Jaylen Claude, MD   1 mg at 06/10/11 0816  . mupirocin ointment (BACTROBAN) 2 % 1 application  1 application Nasal BID Conley Canal, MD   1 application at 06/09/11 2142  . ondansetron (ZOFRAN) injection 4 mg  4 mg Intravenous Q8H PRN Roniyah Llorens, MD   4 mg at 06/07/11 2058  . pantoprazole (PROTONIX) EC tablet 40 mg  40 mg Oral Q1200 Bethany Hirt, MD   40 mg at 06/09/11 1159  . potassium chloride 10 mEq in 100 mL IVPB  10 mEq Intravenous Q1 Hr x 2 Liz Malady, MD   10 mEq at 06/09/11 1045  . potassium chloride 20 MEQ/15ML (10%) liquid 40 mEq  40 mEq Oral Once Conley Canal, MD   40 mEq at 06/09/11 2046  . potassium chloride 20 MEQ/15ML (10%) liquid 40 mEq  40 mEq Oral Once Jennefer Kopp, MD      . vancomycin (VANCOCIN) 750 mg in sodium chloride 0.9 % 150 mL IVPB  750 mg Intravenous Once Liz Malady, MD   750 mg at 06/09/11 1616  . DISCONTD:  bupivacaine-EPINEPHrine (MARCAINE W/ EPI) 0.25 % (with pres) injection    PRN Liz Malady, MD   10 mL at 06/09/11 1709  . DISCONTD: dextrose 5 %-0.9 % sodium chloride infusion   Intravenous Continuous Rhyleigh Grassel, MD 75 mL/hr at 06/10/11 0326    . DISCONTD: lactated ringers infusion   Intravenous Continuous Kipp Brood, MD 20 mL/hr at 06/09/11 1341    . DISCONTD: Omnipaque 300 mg/mL (50 mL) in 0.9% normal saline (50 mL)    PRN Liz Malady, MD      . DISCONTD: Omnipaque 350 mg/mL (50 mL) in 0.9% normal saline (50 mL)    PRN Theda Belfast, MD      . DISCONTD: sodium chloride irrigation 0.9 %    PRN Liz Malady, MD   1,000 mL at 06/09/11 1646  . DISCONTD: sodium chloride irrigation 0.9 %    PRN Liz Malady, MD   2,000 mL at 06/09/11 1648   Facility-Administered Medications Ordered in Other Encounters  Medication Dose Route Frequency Provider Last Rate Last Dose  . DISCONTD: droperidol (INAPSINE) injection    PRN  Atilano Ina, CRNA   0.6 mg at 06/09/11 1654  . DISCONTD: fentaNYL (SUBLIMAZE) injection    PRN Atilano Ina, CRNA   125 mcg at 06/09/11 1701  . DISCONTD: glycopyrrolate (ROBINUL) injection    PRN Atilano Ina, CRNA   0.6 mg at 06/09/11 1707  . DISCONTD: lactated ringers infusion    Continuous PRN Atilano Ina, CRNA      . DISCONTD: midazolam (VERSED) 5 MG/5ML injection    PRN Atilano Ina, CRNA   2 mg at 06/09/11 1610  . DISCONTD: neostigmine (PROSTIGMINE) injection   Intravenous PRN Atilano Ina, CRNA   3 mg at 06/09/11 1707  . DISCONTD: ondansetron (ZOFRAN) injection    PRN Atilano Ina, CRNA   4 mg at 06/09/11 1706  . DISCONTD: propofol (DIPRIVAN) 10 MG/ML infusion    PRN Atilano Ina, CRNA   150 mg at 06/09/11 1620  . DISCONTD: succinylcholine (ANECTINE) injection    PRN Atilano Ina, CRNA   100 mg at 06/09/11 1620  . DISCONTD: vecuronium (NORCURON) injection    PRN Atilano Ina, CRNA   3 mg at 06/09/11 1629   Allergies  Allergen  Reactions  . Oxycodone-Acetaminophen     REACTION: itch   Active Problems:  Cholelithiasis with obstruction  UTI (lower urinary tract infection)   Vital signs in last 24 hours: Temp:  [97.3 F (36.3 C)-98.5 F (36.9 C)] 97.3 F (36.3 C) (04/13 0605) Pulse Rate:  [70-98] 83  (04/13 0605) Resp:  [10-22] 20  (04/13 0605) BP: (86-142)/(49-71) 94/60 mmHg (04/13 0613) SpO2:  [98 %-100 %] 99 % (04/13 0605) Weight change:  Last BM Date: 06/06/11  Intake/Output from previous day: 04/12 0701 - 04/13 0700 In: 3700 [I.V.:3500; IV Piggyback:200] Out: 25 [Blood:25] Intake/Output this shift:    Lab Results:  Basename 06/10/11 0500 06/09/11 0550  WBC 8.9 9.9  HGB 12.9 13.3  HCT 38.4 39.2  PLT 172 177   BMET  Basename 06/10/11 0500 06/09/11 0550  NA 143 140  K 3.4* 3.3*  CL 110 107  CO2 25 23  GLUCOSE 94 115*  BUN 4* 5*  CREATININE 0.58 0.57  CALCIUM 9.8 10.0    Studies/Results: Dg Ercp With Sphincterotomy  06/08/2011  *RADIOLOGY REPORT*  Clinical Data: Gallstones.  ERCP  Comparison:  None.  Technique:  Multiple spot images obtained with the fluoroscopic device and submitted for interpretation post-procedure.  ERCP was performed by Dr. Elnoria Howard  Findings: Four fluoroscopic spot films are submitted for interpretation.  These demonstrate endoscope in the second part of the duodenum with cannulation of the common bile duct which is dilated, estimated at over 1 cm.  Balloon is inflated in the distal common bile duct.  No filling defect is identified on the final image.  Contrast column continues to opacify the common bile duct.  IMPRESSION: Dilated common bile duct.  These images were submitted for radiologic interpretation only. Please see the procedural report for the amount of contrast and the fluoroscopy time utilized.  Original Report Authenticated By: Andreas Newport, M.D.    Medications: I have reviewed the patient's current medications.   Physical exam GENERAL- alert HEAD-  normal atraumatic, no neck masses, normal thyroid, no jvd RESPIRATORY- appears well, vitals normal, no respiratory distress, acyanotic, normal RR, ear and throat exam is normal, neck free of mass or lymphadenopathy, chest clear, no wheezing, crepitations, rhonchi, normal symmetric air entry CVS- regular rate and rhythm, S1, S2 normal,  no murmur, click, rub or gallop ABDOMEN- slight tenderness ruq. +BS. NEURO- Grossly normal EXTREMITIES- extremities normal, atraumatic, no cyanosis or edema  Plan .Choledocholelithiasis with obstruction- Appreciate gi/ccs. Doing well post op day#1. Marland KitchenUTI (lower urinary tract infection)- multi bacteria on urine culture. Continue Ciprofloxacin to complete 5-7 days. Hypokalemia- due to poor oral intake, replenish.  Dvt/gi prophylaxis.  Condition fair.     Odessa Morren 06/10/2011 10:13 AM Pager: [phone].

## 2011-06-11 MED ORDER — CIPROFLOXACIN HCL 500 MG PO TABS: 500.0000 mg | ORAL_TABLET | Freq: Two times a day (BID) | ORAL | Status: AC

## 2011-06-11 MED ORDER — MUPIROCIN 2 % EX OINT: 1.0000 "application " | TOPICAL_OINTMENT | Freq: Two times a day (BID) | CUTANEOUS | Status: AC

## 2011-06-11 MED ORDER — CHLORHEXIDINE GLUCONATE CLOTH 2 % EX PADS: 6.0000 | MEDICATED_PAD | Freq: Every day | CUTANEOUS | Status: DC

## 2011-06-11 MED ORDER — SENNA-DOCUSATE SODIUM 8.6-50 MG PO TABS: 1.0000 | ORAL_TABLET | Freq: Every day | ORAL | Status: DC

## 2011-06-11 MED ORDER — HYDROCODONE-ACETAMINOPHEN 10-325 MG PO TABS: 1.0000 | ORAL_TABLET | Freq: Four times a day (QID) | ORAL | Status: DC | PRN

## 2011-06-11 NOTE — Discharge Summary (Signed)
DISCHARGE SUMMARY  [name]  MR#: [id number]  DOB:Jan 02, 1966  Date of Admission: 06/06/2011 Date of Discharge: 06/11/2011  Attending Physician:[name]  Patient's JYN:WGNFAOZ,HYQMVHQI, MD, MD  Consults:Treatment Team:  Md Roberta Morita, MD Dr [name]  Discharge Diagnoses: Present on Admission:  .Cholelithiasis with obstruction .UTI (lower urinary tract infection)   Hospital Course: [name] was admitted with RUQ pain, radiating to the back. She was found to have choledocholithiasis, and required ERCP done by Dr [name], with removal of CBD stone, followed by lap chole done by Dr [name]. She also had a uti. She feels better, and will be discharged to follow with [facility] as scheduled. She is discharged in stable condition.   Medication List  As of 06/11/2011  3:15 PM   STOP taking these medications         HYDROcodone-acetaminophen 5-325 MG per tablet         TAKE these medications         Chlorhexidine Gluconate Cloth 2 % Pads   Apply 6 each topically daily at 6 (six) AM.      ciprofloxacin 500 MG tablet   Commonly known as: CIPRO   Take 1 tablet (500 mg total) by mouth 2 (two) times daily.      HYDROcodone-acetaminophen 10-325 MG per tablet   Commonly known as: NORCO   Take 1-2 tablets by mouth every 6 (six) hours as needed.      mupirocin ointment 2 %   Commonly known as: BACTROBAN   Apply 1 application topically 2 (two) times daily.      sennosides-docusate sodium 8.6-50 MG tablet   Commonly known as: SENOKOT-S   Take 1 tablet by mouth daily.             Day of Discharge BP 95/62  Pulse 89  Temp(Src) 98.6 F (37 C) (Oral)  Resp 18  Ht 5\' 2"  (1.575 m)  Wt 74.3 kg (163 lb 12.8 oz)  BMI 29.96 kg/m2  SpO2 99%  Physical Exam: Some slight abdominal tenderness to palpation. No bleeding or discharge from surgical scars.  No results found for this or any previous visit (from the past 24 hour(s)).  Disposition: home today.   Follow-up  Appts:   Follow-up Information    Follow up with [facility], MD. Schedule an appointment as soon as possible for a visit in 3 weeks.   Contact information:   [facility] [address] [place] [address] [phone]           Time spent in discharge (includes decision making & examination of pt): 25 minutes  Signed: Tranquilino [name] 06/11/2011, 3:15 PM

## 2011-06-12 ENCOUNTER — Encounter (HOSPITAL_COMMUNITY): Payer: Self-pay | Admitting: General Surgery

## 2011-06-13 ENCOUNTER — Telehealth (INDEPENDENT_AMBULATORY_CARE_PROVIDER_SITE_OTHER): Payer: Self-pay | Admitting: General Surgery

## 2011-06-13 NOTE — Telephone Encounter (Signed)
I called and gave the pt an appt for 5/8

## 2011-06-13 NOTE — Telephone Encounter (Signed)
Pt calling for post op appt with Dr. Janee Morn.  She has surgery over the weekend; states she is doing well at home.  No problems.

## 2011-06-15 ENCOUNTER — Telehealth (INDEPENDENT_AMBULATORY_CARE_PROVIDER_SITE_OTHER): Payer: Self-pay

## 2011-06-15 ENCOUNTER — Other Ambulatory Visit: Payer: Self-pay | Admitting: Internal Medicine

## 2011-06-15 NOTE — Telephone Encounter (Signed)
Received request for refill on Norco.  I called in the refill protocol 5/325 one tab po q4 -6 prnpain #30 no refills generic allowed to [facility].

## 2011-06-20 ENCOUNTER — Telehealth (INDEPENDENT_AMBULATORY_CARE_PROVIDER_SITE_OTHER): Payer: Self-pay | Admitting: General Surgery

## 2011-06-20 ENCOUNTER — Other Ambulatory Visit: Payer: Self-pay | Admitting: General Surgery

## 2011-06-20 MED ORDER — HYDROCODONE-ACETAMINOPHEN 10-325 MG PO TABS: 1.0000 | ORAL_TABLET | Freq: Four times a day (QID) | ORAL | Status: AC | PRN

## 2011-06-20 NOTE — Telephone Encounter (Signed)
Pt calling to request another refill of pain meds.  Vicodin called in 06/15/11, per standard refill protocol.  She had lap chole on 4/12 (Dr. Janee Morn) and reports pain with any sneeze or cough, and intermitant sharp, shooting pain at RUQ. She uses [facility]:  [phone] if approved.

## 2011-06-20 NOTE — Telephone Encounter (Signed)
Called pt to let her know pain meds (Hydrocodone 5/325 mg, # 30, 1 po q 4-6 h prn pain, NO refill) were called in per VO Dr. Gerrit Friends to her pharmacy.

## 2011-06-20 NOTE — Telephone Encounter (Signed)
I put refill in EPIC

## 2011-07-05 ENCOUNTER — Encounter (INDEPENDENT_AMBULATORY_CARE_PROVIDER_SITE_OTHER): Payer: Self-pay | Admitting: General Surgery

## 2011-07-05 ENCOUNTER — Encounter (INDEPENDENT_AMBULATORY_CARE_PROVIDER_SITE_OTHER): Payer: Self-pay

## 2011-07-05 ENCOUNTER — Ambulatory Visit (INDEPENDENT_AMBULATORY_CARE_PROVIDER_SITE_OTHER): Payer: BC Managed Care – PPO | Admitting: General Surgery

## 2011-07-05 VITALS — BP 110/82 | HR 97 | Temp 98.2°F | Ht 62.0 in | Wt 161.4 lb

## 2011-07-05 DIAGNOSIS — Z9049 Acquired absence of other specified parts of digestive tract: Secondary | ICD-10-CM

## 2011-07-05 DIAGNOSIS — Z9889 Other specified postprocedural states: Secondary | ICD-10-CM

## 2011-07-05 NOTE — Progress Notes (Signed)
Subjective:     Patient ID: [name], female   DOB: [birth year], 46 y.o.   MRN: [id number]  HPI Patient is status post laparoscopic cholecystectomy. She's having some tingling in her umbilical incision. Occasionally still some right upper quadrant pain. She also noticed the numbness in the skin of her abdominal wall.  Review of Systems  Objective:   Physical Exam Small Vicryl-stitch was removed from our lateral port site. All wounds are well healed without signs of infection. Abdomen is soft and nontender.    Assessment:  Doing well status post laparoscopic cholecystectomy.   Plan:     After the numbness and tingling will resolve as her skin her prescription. She may return to work on Jul 12, 2011. Return p.r.n.

## 2011-11-26 ENCOUNTER — Emergency Department (HOSPITAL_COMMUNITY)
Admission: EM | Admit: 2011-11-26 | Discharge: 2011-11-27 | Disposition: A | Discharge status: Home / Self Care | Payer: BC Managed Care – PPO | Attending: Emergency Medicine | Admitting: Emergency Medicine

## 2011-11-26 DIAGNOSIS — Z833 Family history of diabetes mellitus: Secondary | ICD-10-CM | POA: Insufficient documentation

## 2011-11-26 DIAGNOSIS — Z888 Allergy status to other drugs, medicaments and biological substances status: Secondary | ICD-10-CM | POA: Insufficient documentation

## 2011-11-26 DIAGNOSIS — F3289 Other specified depressive episodes: Secondary | ICD-10-CM | POA: Insufficient documentation

## 2011-11-26 DIAGNOSIS — Z8249 Family history of ischemic heart disease and other diseases of the circulatory system: Secondary | ICD-10-CM | POA: Insufficient documentation

## 2011-11-26 DIAGNOSIS — G43909 Migraine, unspecified, not intractable, without status migrainosus: Secondary | ICD-10-CM

## 2011-11-26 DIAGNOSIS — F329 Major depressive disorder, single episode, unspecified: Secondary | ICD-10-CM | POA: Insufficient documentation

## 2011-11-27 ENCOUNTER — Encounter (HOSPITAL_COMMUNITY): Payer: Self-pay | Admitting: Emergency Medicine

## 2011-11-27 MED ORDER — KETOROLAC TROMETHAMINE 30 MG/ML IJ SOLN
30.0000 mg | Freq: Once | INTRAMUSCULAR | Status: AC
  Administered 2011-11-27: 30 mg via INTRAMUSCULAR
  Filled 2011-11-27: qty 1

## 2011-11-27 MED ORDER — DIPHENHYDRAMINE HCL 25 MG PO CAPS
25.0000 mg | ORAL_CAPSULE | Freq: Once | ORAL | Status: AC
  Administered 2011-11-27: 25 mg via ORAL
  Filled 2011-11-27: qty 1

## 2011-11-27 MED ORDER — METOCLOPRAMIDE HCL 5 MG/ML IJ SOLN
10.0000 mg | Freq: Once | INTRAMUSCULAR | Status: AC
  Administered 2011-11-27: 10 mg via INTRAMUSCULAR
  Filled 2011-11-27: qty 2

## 2011-11-27 NOTE — ED Notes (Signed)
Patient states she has a headache that started at @0830  on 11/26/11. She took her prescription meds @0930 , with a repeat dose at @1530  for migraines but experienced no relief. Patient states she is experiencing some nausea. Patient denies any changes in vision. Headache rated 9/10 on pain scale. Will con't to monitor.

## 2011-11-27 NOTE — ED Notes (Signed)
Pt reports a headache that she rates 9/10 pain. Pt reports seeing a doctor for frequent headaches on 08/30, and was placed on  Fioricet, cyclobenzaprine, and Topamax; which worked at first, however now is not relieving headache. NAD.

## 2011-11-27 NOTE — ED Provider Notes (Signed)
History     CSN: [id number]  Arrival date & time 11/26/11  2340   First MD Initiated Contact with Patient 11/27/11 [phone]      Chief Complaint  Patient presents with  . Headache  . Nausea    (Consider location/radiation/quality/duration/timing/severity/associated sxs/prior treatment) HPI Comments: Patient states she gets daily headaches.  She was recently seen by a physician and started on ?and Topamax, which he said initially was helping her headaches, but for the past 24, hours.  She's had no relief with this regime.  She states the nausea is the worst part of this particular headache normal time I  Patient is a 46 y.o. female presenting with headaches. The history is provided by the patient.  Headache  This is a recurrent problem. The current episode started 12 to 24 hours ago. The problem occurs constantly. The problem has not changed since onset.The headache is associated with nothing. Associated symptoms include nausea. Pertinent negatives include no fever and no vomiting.    Past Medical History  Diagnosis Date  . Depression   . Gall stones     Past Surgical History  Procedure Date  . Dilation and curettage of uterus   . Tonsillectomy   . Ercp 06/08/2011    Procedure: ENDOSCOPIC RETROGRADE CHOLANGIOPANCREATOGRAPHY (ERCP);  Surgeon: Theda Belfast, MD;  Location: [facility] ENDOSCOPY;  Service: Endoscopy;  Laterality: N/A;  . Cholecystectomy 06/09/2011    Procedure: LAPAROSCOPIC CHOLECYSTECTOMY;  Surgeon: Liz Malady, MD;  Location: [facility] OR;  Service: General;  Laterality: N/A;    Family History  Problem Relation Age of Onset  . Diabetes Mother   . Heart disease Mother   . Heart failure Father   . Heart disease Father     History  Substance Use Topics  . Smoking status: Never Smoker   . Smokeless tobacco: Never Used  . Alcohol Use: Yes     occasionally    OB History    Grav Para Term Preterm Abortions TAB SAB Ect Mult Living                  Review of Systems   Constitutional: Negative for fever and chills.  HENT: Negative for rhinorrhea.   Eyes: Positive for photophobia. Negative for pain and visual disturbance.  Gastrointestinal: Positive for nausea. Negative for vomiting.  Skin: Negative for rash and wound.  Neurological: Positive for headaches. Negative for dizziness and weakness.    Allergies  Oxycodone-acetaminophen  Home Medications   Current Outpatient Rx  Name Route Sig Dispense Refill  . BUTALBITAL-APAP-CAFFEINE 50-325-40 MG PO TABS Oral Take 1 tablet by mouth every 4 (four) hours as needed. For migraines    . CLONAZEPAM 0.5 MG PO TABS Oral Take 0.5 mg by mouth 2 (two) times daily as needed. For stress    . CYCLOBENZAPRINE HCL 10 MG PO TABS Oral Take 5 mg by mouth 3 (three) times daily as needed. For tension    . ETONOGESTREL 68 MG [place] IMPL Subcutaneous Inject 1 each into the skin once.    Marland Kitchen FLUCONAZOLE 150 MG PO TABS Oral Take 150 mg by mouth once.    Marland Kitchen METRONIDAZOLE 500 MG PO TABS Oral Take 500 mg by mouth 2 (two) times daily.    . TOPIRAMATE 50 MG PO TABS Oral Take 50 mg by mouth at bedtime.      BP 117/79  Pulse 79  Temp 98.2 F (36.8 C) (Oral)  Resp 18  Ht 5\' 2"  (1.575 m)  Wt 162 lb (73.483 kg)  BMI 29.63 kg/m2  SpO2 99%  Physical Exam  Constitutional: She is oriented to person, place, and time. She appears well-developed and well-nourished.  HENT:  Head: Normocephalic.  Mouth/Throat: Uvula is midline.  Eyes: Conjunctivae normal and EOM are normal. Pupils are equal, round, and reactive to light.  Neck: Normal range of motion.  Cardiovascular: Normal rate.   Pulmonary/Chest: Effort normal.  Abdominal: Soft.  Musculoskeletal: Normal range of motion.  Neurological: She is alert and oriented to person, place, and time.  Skin: Skin is warm.    ED Course  Procedures (including critical care time)  Labs Reviewed - No data to display No results found.   1. Migraine headache       MDM  We'll treat with  IM, Reglan, and by mouth Benadryl         Arman Filter, NP 11/27/11 [phone]

## 2011-11-27 NOTE — ED Provider Notes (Signed)
Medical screening examination/treatment/procedure(s) were performed by non-physician practitioner and as supervising physician I was immediately available for consultation/collaboration.  Kanye Depree R. Casy Tavano, MD 11/27/11 2138 

## 2011-12-05 ENCOUNTER — Other Ambulatory Visit (HOSPITAL_COMMUNITY): Payer: Self-pay | Admitting: Unknown Physician Specialty

## 2011-12-05 DIAGNOSIS — Z1231 Encounter for screening mammogram for malignant neoplasm of breast: Secondary | ICD-10-CM

## 2011-12-26 ENCOUNTER — Ambulatory Visit (HOSPITAL_COMMUNITY): Payer: BC Managed Care – PPO

## 2012-02-09 ENCOUNTER — Encounter (HOSPITAL_COMMUNITY): Payer: Self-pay | Admitting: Emergency Medicine

## 2012-02-09 ENCOUNTER — Emergency Department (HOSPITAL_COMMUNITY): Payer: BC Managed Care – PPO

## 2012-02-09 ENCOUNTER — Emergency Department (HOSPITAL_COMMUNITY)
Admission: EM | Admit: 2012-02-09 | Discharge: 2012-02-10 | Disposition: A | Discharge status: Home / Self Care | Payer: BC Managed Care – PPO | Attending: Emergency Medicine | Admitting: Emergency Medicine

## 2012-02-09 DIAGNOSIS — F329 Major depressive disorder, single episode, unspecified: Secondary | ICD-10-CM | POA: Insufficient documentation

## 2012-02-09 DIAGNOSIS — M545 Low back pain, unspecified: Secondary | ICD-10-CM | POA: Insufficient documentation

## 2012-02-09 DIAGNOSIS — F3289 Other specified depressive episodes: Secondary | ICD-10-CM | POA: Insufficient documentation

## 2012-02-09 DIAGNOSIS — R1013 Epigastric pain: Secondary | ICD-10-CM | POA: Insufficient documentation

## 2012-02-09 DIAGNOSIS — M2550 Pain in unspecified joint: Secondary | ICD-10-CM | POA: Insufficient documentation

## 2012-02-09 DIAGNOSIS — Z3202 Encounter for pregnancy test, result negative: Secondary | ICD-10-CM | POA: Insufficient documentation

## 2012-02-09 DIAGNOSIS — R110 Nausea: Secondary | ICD-10-CM | POA: Insufficient documentation

## 2012-02-09 DIAGNOSIS — Z8719 Personal history of other diseases of the digestive system: Secondary | ICD-10-CM | POA: Insufficient documentation

## 2012-02-09 DIAGNOSIS — Z79899 Other long term (current) drug therapy: Secondary | ICD-10-CM | POA: Insufficient documentation

## 2012-02-09 LAB — CBC WITH DIFFERENTIAL/PLATELET
Basophils Absolute: 0 10*3/uL (ref 0.0–0.1)
Eosinophils Relative: 1 % (ref 0–5)
Lymphocytes Relative: 10 % — ABNORMAL LOW (ref 12–46)
Lymphs Abs: 1.5 10*3/uL (ref 0.7–4.0)
MCV: 95.8 fL (ref 78.0–100.0)
Neutro Abs: 12 10*3/uL — ABNORMAL HIGH (ref 1.7–7.7)
Platelets: 203 10*3/uL (ref 150–400)
RBC: 4.51 MIL/uL (ref 3.87–5.11)
RDW: 12.2 % (ref 11.5–15.5)
WBC: 14.6 10*3/uL — ABNORMAL HIGH (ref 4.0–10.5)

## 2012-02-09 LAB — COMPREHENSIVE METABOLIC PANEL
Albumin: 3.6 g/dL (ref 3.5–5.2)
Alkaline Phosphatase: 67 U/L (ref 39–117)
BUN: 10 mg/dL (ref 6–23)
Chloride: 104 mEq/L (ref 96–112)
Creatinine, Ser: 0.54 mg/dL (ref 0.50–1.10)
GFR calc Af Amer: >90 mL/min (ref >90)
Glucose, Bld: 109 mg/dL — ABNORMAL HIGH (ref 70–99)
Total Bilirubin: 0.5 mg/dL (ref 0.3–1.2)
Total Protein: 7.2 g/dL (ref 6.0–8.3)

## 2012-02-09 LAB — URINALYSIS, ROUTINE W REFLEX MICROSCOPIC
Glucose, UA: NEGATIVE mg/dL
Ketones, ur: 40 mg/dL — AB
Leukocytes, UA: NEGATIVE
Nitrite: NEGATIVE
Specific Gravity, Urine: 1.018 (ref 1.005–1.030)
pH: 7.5 (ref 5.0–8.0)

## 2012-02-09 LAB — URINE MICROSCOPIC-ADD ON

## 2012-02-09 LAB — WET PREP, GENITAL
Trich, Wet Prep: NONE SEEN
Yeast Wet Prep HPF POC: NONE SEEN

## 2012-02-09 MED ORDER — HYDROMORPHONE HCL PF 1 MG/ML IJ SOLN
1.0000 mg | Freq: Once | INTRAMUSCULAR | Status: AC
  Administered 2012-02-09: 1 mg via INTRAVENOUS
  Filled 2012-02-09: qty 1

## 2012-02-09 MED ORDER — CEFTRIAXONE SODIUM 1 G IJ SOLR
1.0000 g | Freq: Once | INTRAMUSCULAR | Status: AC
  Administered 2012-02-09: 1 g via INTRAVENOUS
  Filled 2012-02-09: qty 10

## 2012-02-09 MED ORDER — GI COCKTAIL ~~LOC~~
30.0000 mL | Freq: Once | ORAL | Status: AC
  Administered 2012-02-09: 30 mL via ORAL
  Filled 2012-02-09: qty 30

## 2012-02-09 MED ORDER — DEXAMETHASONE SODIUM PHOSPHATE 10 MG/ML IJ SOLN
10.0000 mg | Freq: Once | INTRAMUSCULAR | Status: AC
  Administered 2012-02-09: 10 mg via INTRAVENOUS
  Filled 2012-02-09: qty 1

## 2012-02-09 MED ORDER — ONDANSETRON HCL 4 MG/2ML IJ SOLN: 4.0000 mg | Freq: Once | INTRAMUSCULAR | Status: DC

## 2012-02-09 MED ORDER — SODIUM CHLORIDE 0.9 % IV BOLUS (SEPSIS)
1000.0000 mL | Freq: Once | INTRAVENOUS | Status: AC
  Administered 2012-02-09: 1000 mL via INTRAVENOUS

## 2012-02-09 MED ORDER — ONDANSETRON HCL 4 MG PO TABS: 4.0000 mg | ORAL_TABLET | Freq: Four times a day (QID) | ORAL | Status: DC

## 2012-02-09 MED ORDER — CEPHALEXIN 500 MG PO CAPS: 500.0000 mg | ORAL_CAPSULE | Freq: Four times a day (QID) | ORAL | Status: DC

## 2012-02-09 MED ORDER — ONDANSETRON HCL 4 MG/2ML IJ SOLN
4.0000 mg | Freq: Once | INTRAMUSCULAR | Status: AC
  Administered 2012-02-09: 4 mg via INTRAVENOUS
  Filled 2012-02-09: qty 2

## 2012-02-09 MED ORDER — OXYCODONE-ACETAMINOPHEN 5-325 MG PO TABS: 2.0000 | ORAL_TABLET | ORAL | Status: DC | PRN

## 2012-02-09 NOTE — ED Notes (Signed)
Pt resting comfortably in bed, A&Ox4, family at bedside, nad.

## 2012-02-09 NOTE — ED Notes (Signed)
Patient transported to X-ray 

## 2012-02-09 NOTE — ED Notes (Signed)
Pt c/o  sharp abd pain in mid upper quadrant that radiates to her lower quadrant describes pain as a sharp pain and rates it 8/10 and left leg pain that radiates to left foot rates pain a 5/10 that started 4 days ago. Pt states she has been taking vicodin, sulindac and Neurotin for sciatica. Denies n/v/d.

## 2012-02-09 NOTE — ED Provider Notes (Signed)
Medical screening examination/treatment/procedure(s) were performed by non-physician practitioner and as supervising physician I was immediately available for consultation/collaboration.   Charles B. Bernette Mayers, MD 02/09/12 1940

## 2012-02-09 NOTE — ED Notes (Signed)
Report given to cdu.  Pt transferred to room.

## 2012-02-09 NOTE — ED Notes (Signed)
abd pain x 3-4 days strated as upper now has moved lower some to left side . Had gb out in April denies dysuria or vag d/c has had some nausea

## 2012-02-09 NOTE — ED Notes (Addendum)
PA at bedside.

## 2012-02-09 NOTE — ED Provider Notes (Signed)
History     CSN: [id number]  Arrival date & time 02/09/12  1228   First MD Initiated Contact with Patient 02/09/12 1607      Chief Complaint  Patient presents with  . Abdominal Pain    (Consider location/radiation/quality/duration/timing/severity/associated sxs/prior treatment) The history is provided by the patient and medical records.    [name] is a 46 y.o. female  with a hx of cholecystectomy in May 2013 presents to the Emergency Department complaining of gradual, persistent, progressively worsening upper abdominal pain onset 4 days ago, worsening this morning.  Associated symptoms include nausea.  Nothing makes it better and bending makes it worse.  Pain migrated into the lower abdomen and low back this morning. Pt also with Hx of sciatica which is being treated with gabapentin.  Pt denies fever, chills, headache, chest pain, SOB, vomiting, diarrhea, constipation, weakness, dizziness, syncope, vaginal discharge, vaginal bleeding, dysuria.  Last pelvic exam was done 3 mos ago and without abnormality.  Pt with Hx of hematuria without known cause.  PCP at [facility] clinic.     Past Medical History  Diagnosis Date  . Depression   . Gall stones     Past Surgical History  Procedure Date  . Dilation and curettage of uterus   . Tonsillectomy   . Ercp 06/08/2011    Procedure: ENDOSCOPIC RETROGRADE CHOLANGIOPANCREATOGRAPHY (ERCP);  Surgeon: Theda Belfast, MD;  Location: [facility] ENDOSCOPY;  Service: Endoscopy;  Laterality: N/A;  . Cholecystectomy 06/09/2011    Procedure: LAPAROSCOPIC CHOLECYSTECTOMY;  Surgeon: Liz Malady, MD;  Location: [facility] OR;  Service: General;  Laterality: N/A;    Family History  Problem Relation Age of Onset  . Diabetes Mother   . Heart disease Mother   . Heart failure Father   . Heart disease Father     History  Substance Use Topics  . Smoking status: Never Smoker   . Smokeless tobacco: Never Used  . Alcohol Use: Yes     Comment: occasionally     OB History    Grav Para Term Preterm Abortions TAB SAB Ect Mult Living                  Review of Systems  Constitutional: Negative for fever, diaphoresis, appetite change, fatigue and unexpected weight change.  HENT: Negative for mouth sores and neck stiffness.   Eyes: Negative for visual disturbance.  Respiratory: Negative for cough, chest tightness, shortness of breath and wheezing.   Cardiovascular: Negative for chest pain.  Gastrointestinal: Positive for nausea and abdominal pain. Negative for vomiting, diarrhea and constipation.  Genitourinary: Negative for dysuria, urgency, frequency and hematuria.  Musculoskeletal: Positive for back pain and arthralgias.  Skin: Negative for rash.  Neurological: Negative for syncope, light-headedness and headaches.  Psychiatric/Behavioral: Negative for sleep disturbance. The patient is not nervous/anxious.   All other systems reviewed and are negative.    Allergies  Oxycodone-acetaminophen  Home Medications   Current Outpatient Rx  Name  Route  Sig  Dispense  Refill  . ETONOGESTREL 68 MG [place] IMPL   Subcutaneous   Inject 1 each into the skin once.         Marland Kitchen GABAPENTIN 300 MG PO CAPS   Oral   Take 300 mg by mouth 3 (three) times daily.         Marland Kitchen HYDROCODONE-ACETAMINOPHEN 5-325 MG PO TABS   Oral   Take 1 tablet by mouth every 6 (six) hours as needed. For pain         .  OMEPRAZOLE 20 MG PO CPDR   Oral   Take 20 mg by mouth daily.         Marland Kitchen PHENTERMINE HCL 37.5 MG PO TABS   Oral   Take 37.5 mg by mouth daily before breakfast.         . SULINDAC 200 MG PO TABS   Oral   Take 200 mg by mouth daily.           BP 119/83  Pulse 113  Temp 98.4 F (36.9 C)  Resp 16  SpO2 99%  Physical Exam  Nursing note and vitals reviewed. Constitutional: She appears well-developed and well-nourished.  HENT:  Head: Normocephalic and atraumatic.  Mouth/Throat: Oropharynx is clear and moist.  Eyes: Conjunctivae normal are  normal. Pupils are equal, round, and reactive to light. No scleral icterus.  Neck: Normal range of motion.  Cardiovascular: Regular rhythm, S1 normal, S2 normal, normal heart sounds and intact distal pulses.  Tachycardia present.  Exam reveals no gallop and no friction rub.   No murmur heard. Pulses:      Radial pulses are 2+ on the right side, and 2+ on the left side.       Dorsalis pedis pulses are 2+ on the right side, and 2+ on the left side.       Posterior tibial pulses are 2+ on the right side, and 2+ on the left side.  Pulmonary/Chest: Effort normal and breath sounds normal. No respiratory distress. She has no wheezes. She has no rales. She exhibits no tenderness.  Abdominal: Soft. Bowel sounds are normal. She exhibits no distension and no mass. There is tenderness in the right upper quadrant, epigastric area and left upper quadrant. There is guarding. There is no rigidity, no rebound, no CVA tenderness, no tenderness at McBurney's point and negative Murphy's sign.  Genitourinary: Pelvic exam was performed with patient supine. No labial fusion. There is no rash, tenderness, lesion or injury on the right labia. There is no rash, tenderness, lesion or injury on the left labia. Uterus is not deviated, not enlarged, not fixed and not tender. Cervix exhibits no motion tenderness, no discharge and no friability. Right adnexum displays no mass, no tenderness and no fullness. Left adnexum displays no mass, no tenderness and no fullness. No erythema, tenderness or bleeding around the vagina. No foreign body around the vagina. No signs of injury around the vagina. No vaginal discharge found.  Lymphadenopathy:    She has no cervical adenopathy.       Right: No inguinal adenopathy present.       Left: No inguinal adenopathy present.  Neurological: She is alert.  Skin: Skin is warm and dry.  Psychiatric: She has a normal mood and affect.    ED Course  Procedures (including critical care time)  Labs  Reviewed  COMPREHENSIVE METABOLIC PANEL - Abnormal; Notable for the following:    Glucose, Bld 109 (*)     All other components within normal limits  CBC WITH DIFFERENTIAL - Abnormal; Notable for the following:    WBC 14.6 (*)     Neutrophils Relative 82 (*)     Neutro Abs 12.0 (*)     Lymphocytes Relative 10 (*)     All other components within normal limits  URINALYSIS, ROUTINE W REFLEX MICROSCOPIC - Abnormal; Notable for the following:    Hgb urine dipstick MODERATE (*)     Ketones, ur 40 (*)     All other components within normal limits  URINE MICROSCOPIC-ADD ON - Abnormal; Notable for the following:    Squamous Epithelial / LPF FEW (*)     Bacteria, UA MANY (*)     All other components within normal limits  LIPASE, BLOOD  PREGNANCY, URINE  URINE CULTURE  WET PREP, GENITAL  GC/CHLAMYDIA PROBE AMP   Dg Abd Acute W/chest  02/09/2012  *RADIOLOGY REPORT*  Clinical Data: Mid abdominal pain for 4 days  ACUTE ABDOMEN SERIES (ABDOMEN 2 VIEW & CHEST 1 VIEW)  Comparison: Chest radiograph 06/07/2011  Findings: Normal heart size, mediastinal contours, and pulmonary vascularity. Lungs clear. Minimal chronic peribronchial thickening. No pleural effusion or pneumothorax. Surgical clips right upper quadrant consistent with history of cholecystectomy. Normal bowel gas pattern. No bowel dilatation, bowel wall thickening, or free intraperitoneal air. Prior left paraspinal density at L3 on the upright view, 10 x 7 mm in size, not visualized on supine exam, question artifact.  IMPRESSION: No acute abnormalities.   Original Report Authenticated By: Ulyses Southward, M.D.      1. Epigastric abdominal pain       MDM  [name] presents with epigastric abdominal pain.  Hx of cholecystectomy in May.  No concern for abscess at the cholecystectomy site. Pt with mild leukocytosis of 14.6, UA contaminated and culture sent.  Lipase within normal limits, CMP unremarkable.  Patient with moderate hemoglobin  on her urine however she states this has been consistent for the last few primary care visits and she is being followed by her primary care for this.  On reevaluation patient with improved symptoms, decrease pain and resolution of nausea.  Patient is to be transferred to the CDU for further rehydration and pain management.  At this time she does not have a surgical abdomen I do not feel that she needs a CT scan.  I discussed the patient and the plan of action with Arthor Captain, PA-C and she'll be moved to CDU.      Dierdre Forth, PA-C 02/09/12 1922

## 2012-02-09 NOTE — ED Notes (Signed)
PT drinking water and tol. Well.

## 2012-02-10 LAB — GC/CHLAMYDIA PROBE AMP
CT Probe RNA: NEGATIVE
GC Probe RNA: NEGATIVE

## 2012-02-11 NOTE — ED Provider Notes (Signed)
Patient  was placed in CDU  by PA Muthersbaugh.  Patient is here for abdominal pain and has received dilaudid.  Plan per previous provider is to rehydrate and control nausea and pain.  Patient had CT scan at [facility] in [place] 2 weeks ago for hematuria and to rule out stone.  I'm unable to obtain reports today.  Patient was fairly tachycardic at triage.  She still complains of epigastric pain although greatly decreased from her initial valuation.  She denies any suprapubic pain however she does have a questionable urine and white blood cell count is elevated.  Going to go ahead and treat her for urinary tract infection and she does have a previous history of UTIs.  Hemodynamically stable stable, NAD, heart w/ RRR, lungs CTAB, Chest & abd non-tender, no peripheral edema or calf tenderness.   Filed Vitals:   02/09/12 1237 02/09/12 1625 02/09/12 2010  BP: 110/88 119/83 116/79  Pulse: 127 113 97  Temp: 98.4 F (36.9 C)  97.5 F (36.4 C)  TempSrc:   Oral  Resp: 16 16 18   SpO2: 99% 99% 100%   Plan is to treat the patient with IV fluids and Rocephin.  If patient is able to hold down fluids I feel she is safe for discharge at this time.  She has been afebrile. tachycardia may have been due to pain and dehydration as her ketones are elevated on urine.   Patient completed PO challenge. She is requesting a steroid for her sciatica and pain medication. Patient has PCP f/u at [facility].  Patient is nontoxic, nonseptic appearing, in no apparent distress.  Patient's pain and other symptoms adequately managed in emergency department.  Fluid bolus given.  Labs, imaging and vitals reviewed.  Patient does not meet the SIRS or Sepsis criteria.  On repeat exam patient does not have a surgical abdomin and there are nor peritoneal signs.  No indication of appendicitis, bowel obstruction, bowel perforation, cholecystitis, diverticulitis, PID or ectopic pregnancy.  Patient discharged home with symptomatic  treatment and given strict instructions for follow-up with their primary care physician.  I have also discussed reasons to return immediately to the ER.  Patient expresses understanding and agrees with plan.      Arthor Captain, PA-C 02/11/12 [phone]

## 2012-02-11 NOTE — ED Provider Notes (Signed)
Medical screening examination/treatment/procedure(s) were performed by non-physician practitioner and as supervising physician I was immediately available for consultation/collaboration.   Cherisse Carrell B. Leavy Heatherly, MD 02/11/12 2135 

## 2012-02-12 LAB — URINE CULTURE

## 2012-02-14 NOTE — ED Notes (Signed)
+   urine culture. Chart sent to EDP for review 

## 2012-02-17 ENCOUNTER — Telehealth (HOSPITAL_COMMUNITY): Payer: Self-pay | Admitting: Emergency Medicine

## 2012-02-17 NOTE — ED Notes (Signed)
Chart returned from EDP office. Per Felicie Morn NP, Keflex treatment likely appropriate--ensure follow-up with PCP to ensure resolution.

## 2012-03-28 ENCOUNTER — Ambulatory Visit (HOSPITAL_COMMUNITY): Payer: BC Managed Care – PPO

## 2013-04-02 ENCOUNTER — Ambulatory Visit (HOSPITAL_COMMUNITY)
Admission: RE | Admit: 2013-04-02 | Discharge: 2013-04-02 | Disposition: A | Discharge status: Home / Self Care | Payer: BC Managed Care – PPO | Source: Ambulatory Visit | Attending: Family Medicine | Admitting: Family Medicine

## 2013-04-02 ENCOUNTER — Ambulatory Visit (INDEPENDENT_AMBULATORY_CARE_PROVIDER_SITE_OTHER): Payer: BC Managed Care – PPO | Admitting: Family Medicine

## 2013-04-02 VITALS — BP 110/80 | HR 100 | Temp 98.4°F | Resp 16 | Ht 62.0 in | Wt 158.0 lb

## 2013-04-02 DIAGNOSIS — J323 Chronic sphenoidal sinusitis: Secondary | ICD-10-CM | POA: Insufficient documentation

## 2013-04-02 DIAGNOSIS — R51 Headache: Secondary | ICD-10-CM | POA: Insufficient documentation

## 2013-04-02 DIAGNOSIS — R0683 Snoring: Secondary | ICD-10-CM

## 2013-04-02 DIAGNOSIS — R0989 Other specified symptoms and signs involving the circulatory and respiratory systems: Secondary | ICD-10-CM

## 2013-04-02 DIAGNOSIS — Q048 Other specified congenital malformations of brain: Secondary | ICD-10-CM | POA: Insufficient documentation

## 2013-04-02 DIAGNOSIS — G4710 Hypersomnia, unspecified: Secondary | ICD-10-CM

## 2013-04-02 LAB — POCT CBC
GRANULOCYTE PERCENT: 56.3 % (ref 37–80)
HCT, POC: 47.3 % (ref 37.7–47.9)
HEMOGLOBIN: 14.9 g/dL (ref 12.2–16.2)
Lymph, poc: 3.3 (ref 0.6–3.4)
MCH: 32.1 pg — AB (ref 27–31.2)
MCHC: 31.5 g/dL — AB (ref 31.8–35.4)
MCV: 102 fL — AB (ref 80–97)
MID (cbc): 0.6 (ref 0–0.9)
MPV: 8.2 fL (ref 0–99.8)
POC GRANULOCYTE: 5 (ref 2–6.9)
POC LYMPH PERCENT: 36.9 %L (ref 10–50)
POC MID %: 6.8 % (ref 0–12)
Platelet Count, POC: 232 10*3/uL (ref 142–424)
RBC: 4.64 M/uL (ref 4.04–5.48)
RDW, POC: 12.6 %
WBC: 8.9 10*3/uL (ref 4.6–10.2)

## 2013-04-02 MED ORDER — CYCLOBENZAPRINE HCL 5 MG PO TABS: 5.0000 mg | ORAL_TABLET | Freq: Three times a day (TID) | ORAL | Status: DC | PRN

## 2013-04-02 MED ORDER — HYDROCODONE-ACETAMINOPHEN 5-325 MG PO TABS: 1.0000 | ORAL_TABLET | Freq: Four times a day (QID) | ORAL | Status: DC | PRN

## 2013-04-02 MED ORDER — PREDNISONE 20 MG PO TABS: ORAL_TABLET | ORAL | Status: DC

## 2013-04-02 MED ORDER — VENLAFAXINE HCL ER 37.5 MG PO CP24: 37.5000 mg | ORAL_CAPSULE | Freq: Every day | ORAL | Status: DC

## 2013-04-02 NOTE — Progress Notes (Addendum)
Subjective:   This chart was scribed for Roberta Chick, MD by [name], [facility] Scribe. This patient was seen in room 9 and the patient's care was started 7:16 PM.    Patient ID: [name], female    DOB: 1965-11-10, 48 y.o.   MRN: [id number]  HPI  HPI Comments: [name] is a 48 y.o. Female with a PMHx of depression who presents to [facility] complaining of a constant frontal HA described as "achy" and "throbbing" rated 10/10 at times that initially started 5 days ago. She reports sensitivity to sounds, and intermittent nausea. She admits to a history of chronic HAs, and reports this current HA being extremely worsened and similar to "child birth". She says normally her HAs last about 5 days or longer, and says she typically has 3-5 a month, with 10 HA free days a month. She has tried Ibuprofen, tylenol, BC powder, and Goody's with no noticeably improvement. She reports worsening snoring that she potentially associates with her HA's. She says she gets about 7-10 hours of sleep every evening, and says sometimes she stops breathing at night. She states her last HA was a couple of weeks ago that was resolved with Marlin Canary powder. Denies photophobia, congestion, visual disturbances, dizziness, tingling, or numbness at this time. Pt is currently has an Implanon implant and does not have a period. She denies a history HTN. Denies ever having a CT or sleep study for her HA's.  She was evaluated by [facility] in [place] in the past; they were recommending occipital injections that pt was not interested in; she did not return. She has never used Imitrex or similar medications for headaches.  Daughter also has a lot of headaches.  She has never been on preventative headache medications.  She has been treated with Wellbutrin in the past for depression; SSRIs have causes sexual issues in the past and pt is very reluctant to take anything that will  affect sexual function.  She takes OTC medication for headaches almost daily.  She also reports intermittent numbness to her left arm that she does not associate with her HA.  Mother died at 53 of an MI. Pt father is currently living and currently has a pacemaker. She states she has 1 sister 71 yrs old who has high blood pressure.  Pt is currently engaged and has 3 children. She lives with her fiance and her son.  She admits to not participating in structured  physical activity/exercise at this time.  She currently does not have a PCP and plans to establish with someone soon.   Review of Systems  Constitutional: Negative for fever and chills.  HENT: Negative for congestion, facial swelling, rhinorrhea and sinus pressure.   Eyes: Negative for photophobia and visual disturbance.  Respiratory: Negative for cough.   Gastrointestinal: Positive for nausea. Negative for vomiting.  Musculoskeletal: Negative for gait problem and myalgias.  Neurological: Positive for headaches. Negative for dizziness, tremors, syncope, facial asymmetry, speech difficulty, weakness, light-headedness and numbness.  Psychiatric/Behavioral: Negative for sleep disturbance and dysphoric mood. The patient is not nervous/anxious.     Past Medical History  Diagnosis Date  . Depression   . Gall stones     Past Surgical History  Procedure Laterality Date  . Dilation and curettage of uterus    . Tonsillectomy    . Ercp  06/08/2011    Procedure: ENDOSCOPIC RETROGRADE CHOLANGIOPANCREATOGRAPHY (ERCP);  Surgeon: Roberta Hawks  Elnoria HowardHung, MD;  [facility];  Service: Endoscopy;  Laterality: N/A;  . Cholecystectomy  06/09/2011    Procedure: LAPAROSCOPIC CHOLECYSTECTOMY;  Surgeon: Roberta MaladyBurke E Thompson, MD;  [facility];  Service: General;  Laterality: N/A;    History   Social History  . Marital Status: Single    Spouse Name: N/A    Number of Children: N/A  . Years of Education: N/A   Occupational History  . Not on  file.   Social History Main Topics  . Smoking status: Never Smoker   . Smokeless tobacco: Never Used  . Alcohol Use: Yes     Comment: occasionally  . Drug Use: No  . Sexual Activity: Yes    Birth Control/ Protection: Implant   Other Topics Concern  . Not on file   Social History Narrative  . No narrative on file   Triage Vitals: BP 110/80  Pulse 100  Temp(Src) 98.4 F (36.9 C)  Resp 16  Ht 5\' 2"  (1.575 m)  Wt 158 lb (71.668 kg)  BMI 28.89 kg/m2  SpO2 100%   Objective:  Physical Exam  Nursing note and vitals reviewed. Constitutional: She is oriented to person, place, and time. She appears well-developed and well-nourished. No distress.  HENT:  Head: Normocephalic and atraumatic.  Right Ear: External ear normal.  Left Ear: External ear normal.  Nose: Nose normal.  Mouth/Throat: Oropharynx is clear and moist. No oropharyngeal exudate.  Eyes: Conjunctivae and EOM are normal. Pupils are equal, round, and reactive to light. Right eye exhibits no discharge. Left eye exhibits no discharge. No scleral icterus.  Neck: Normal range of motion. Neck supple. No thyromegaly present.  Cardiovascular: Normal rate, regular rhythm and normal heart sounds.  Exam reveals no gallop and no friction rub.   No murmur heard. Pulmonary/Chest: Effort normal and breath sounds normal. No respiratory distress. She has no wheezes. She has no rales. She exhibits no tenderness.  Musculoskeletal: Normal range of motion.  Lymphadenopathy:    She has no cervical adenopathy.  Neurological: She is alert and oriented to person, place, and time. She has normal reflexes. She displays normal reflexes. No cranial nerve deficit. She exhibits normal muscle tone. Coordination normal.  Skin: Skin is warm and dry. She is not diaphoretic.  Psychiatric: She has a normal mood and affect. Her behavior is normal. Judgment and thought content normal.    Results for orders placed in visit on 04/02/13  POCT CBC       Result Value Range   WBC 8.9  4.6 - 10.2 K/uL   Lymph, poc 3.3  0.6 - 3.4   POC LYMPH PERCENT 36.9  10 - 50 %L   MID (cbc) 0.6  0 - 0.9   POC MID % 6.8  0 - 12 %M   POC Granulocyte 5.0  2 - 6.9   Granulocyte percent 56.3  37 - 80 %G   RBC 4.64  4.04 - 5.48 M/uL   Hemoglobin 14.9  12.2 - 16.2 g/dL   HCT, POC 40.947.3  81.137.7 - 47.9 %   MCV 102.0 (*) 80 - 97 fL   MCH, POC 32.1 (*) 27 - 31.2 pg   MCHC 31.5 (*) 31.8 - 35.4 g/dL   RDW, POC 91.412.6     Platelet Count, POC 232  142 - 424 K/uL   MPV 8.2  0 - 99.8 fL    Assessment & Plan:  Headache(784.0) - Plan: CT Head Wo Contrast, POCT CBC, Comprehensive metabolic  panel, TSH, Ambulatory referral to Neurology  Snoring - Plan: POCT CBC, Comprehensive metabolic panel, TSH  Hypersomnolence  1.  Headaches:   Chronic with acutely severe headache now.  Due to severity, refer for CT head tonight.  Rx for Prednisone taper to take to break headache cycle; also rx for Flexeril to take tid PRN headache; rx for Hydrocodone provided to use sparingly.  Headaches consistent with migraine headaches (phonophobia, nausea, severity, throbbing nature) with medication overuse headaches also.  Advised to avoid OTC medications when possible.  Recommend starting Effexor XR 37.5mg  one tablet daily for headache prevention. Pt desires referral to different headache clinic for consultation; referral placed.   Meds ordered this encounter  Medications  . predniSONE (DELTASONE) 20 MG tablet    Sig: Three tablets daily x 1 day then two tablets daily x 5 days, then one tablet daily x 5 days    Dispense:  18 tablet    Refill:  0  . cyclobenzaprine (FLEXERIL) 5 MG tablet    Sig: Take 1 tablet (5 mg total) by mouth 3 (three) times daily as needed.    Dispense:  30 tablet    Refill:  0  . HYDROcodone-acetaminophen (NORCO/VICODIN) 5-325 MG per tablet    Sig: Take 1 tablet by mouth every 6 (six) hours as needed for moderate pain.    Dispense:  30 tablet    Refill:  0  .  venlafaxine XR (EFFEXOR-XR) 37.5 MG 24 hr capsule    Sig: Take 1 capsule (37.5 mg total) by mouth daily with breakfast.    Dispense:  30 capsule    Refill:  3   I personally performed the services described in this documentation, which was scribed in my presence.  The recorded information has been reviewed and is accurate.  Nilda Simmer, M.D.  [facility] [address] [place], [place]  [address] [phone] phone [phone] fax

## 2013-04-02 NOTE — Patient Instructions (Addendum)
Migraine Headache A migraine headache is an intense, throbbing pain on one or both sides of your head. A migraine can last for 30 minutes to several hours. CAUSES  The exact cause of a migraine headache is not always known. However, a migraine may be caused when nerves in the brain become irritated and release chemicals that cause inflammation. This causes pain. Certain things may also trigger migraines, such as:  Alcohol.  Smoking.  Stress.  Menstruation.  Aged cheeses.  Foods or drinks that contain nitrates, glutamate, aspartame, or tyramine.  Lack of sleep.  Chocolate.  Caffeine.  Hunger.  Physical exertion.  Fatigue.  Medicines used to treat chest pain (nitroglycerine), birth control pills, estrogen, and some blood pressure medicines. SIGNS AND SYMPTOMS  Pain on one or both sides of your head.  Pulsating or throbbing pain.  Severe pain that prevents daily activities.  Pain that is aggravated by any physical activity.  Nausea, vomiting, or both.  Dizziness.  Pain with exposure to bright lights, loud noises, or activity.  General sensitivity to bright lights, loud noises, or smells. Before you get a migraine, you may get warning signs that a migraine is coming (aura). An aura may include:  Seeing flashing lights.  Seeing bright spots, halos, or zig-zag lines.  Having tunnel vision or blurred vision.  Having feelings of numbness or tingling.  Having trouble talking.  Having muscle weakness. DIAGNOSIS  A migraine headache is often diagnosed based on:  Symptoms.  Physical exam.  A CT scan or MRI of your head. These imaging tests cannot diagnose migraines, but they can help rule out other causes of headaches. TREATMENT Medicines may be given for pain and nausea. Medicines can also be given to help prevent recurrent migraines.  HOME CARE INSTRUCTIONS  Only take over-the-counter or prescription medicines for pain or discomfort as directed by your  health care provider. The use of long-term narcotics is not recommended.  Lie down in a dark, quiet room when you have a migraine.  Keep a journal to find out what may trigger your migraine headaches. For example, write down:  What you eat and drink.  How much sleep you get.  Any change to your diet or medicines.  Limit alcohol consumption.  Quit smoking if you smoke.  Get 7 9 hours of sleep, or as recommended by your health care provider.  Limit stress.  Keep lights dim if bright lights bother you and make your migraines worse. SEEK IMMEDIATE MEDICAL CARE IF:   Your migraine becomes severe.  You have a fever.  You have a stiff neck.  You have vision loss.  You have muscular weakness or loss of muscle control.  You start losing your balance or have trouble walking.  You feel faint or pass out.  You have severe symptoms that are different from your first symptoms. MAKE SURE YOU:   Understand these instructions.  Will watch your condition.  Will get help right away if you are not doing well or get worse. Document Released: 02/13/2005 Document Revised: 12/04/2012 Document Reviewed: 10/21/2012 [employer] Patient Information 2014 [employer].  

## 2013-04-03 ENCOUNTER — Other Ambulatory Visit: Payer: Self-pay | Admitting: Family Medicine

## 2013-04-03 LAB — COMPREHENSIVE METABOLIC PANEL
ALT: 15 U/L (ref 0–35)
AST: 18 U/L (ref 0–37)
Albumin: 4.2 g/dL (ref 3.5–5.2)
Alkaline Phosphatase: 56 U/L (ref 39–117)
BUN: 9 mg/dL (ref 6–23)
CALCIUM: 10.2 mg/dL (ref 8.4–10.5)
CHLORIDE: 108 meq/L (ref 96–112)
CO2: 25 meq/L (ref 19–32)
Creat: 0.57 mg/dL (ref 0.50–1.10)
Glucose, Bld: 83 mg/dL (ref 70–99)
POTASSIUM: 3.8 meq/L (ref 3.5–5.3)
SODIUM: 143 meq/L (ref 135–145)
TOTAL PROTEIN: 6.9 g/dL (ref 6.0–8.3)
Total Bilirubin: 0.4 mg/dL (ref 0.2–1.2)

## 2013-04-03 LAB — TSH: TSH: 0.93 u[IU]/mL (ref 0.350–4.500)

## 2013-04-03 MED ORDER — AMOXICILLIN 500 MG PO CAPS
1000.0000 mg | ORAL_CAPSULE | Freq: Two times a day (BID) | ORAL | Status: DC
Start: 2013-04-03 — End: 2013-05-08

## 2013-04-03 NOTE — Addendum Note (Signed)
Addended by: Ethelda ChickSMITH, Verleen Stuckey M on: 04/03/2013 10:44 AM   Modules accepted: Orders

## 2013-05-08 ENCOUNTER — Ambulatory Visit (INDEPENDENT_AMBULATORY_CARE_PROVIDER_SITE_OTHER): Payer: BC Managed Care – PPO | Admitting: Emergency Medicine

## 2013-05-08 VITALS — BP 120/80 | HR 85 | Temp 98.6°F | Resp 18 | Ht 63.5 in | Wt 161.0 lb

## 2013-05-08 DIAGNOSIS — G43909 Migraine, unspecified, not intractable, without status migrainosus: Secondary | ICD-10-CM

## 2013-05-08 MED ORDER — PROMETHAZINE HCL 25 MG/ML IJ SOLN
25.0000 mg | Freq: Once | INTRAMUSCULAR | Status: AC
  Administered 2013-05-08: 25 mg via INTRAMUSCULAR

## 2013-05-08 MED ORDER — NALBUPHINE HCL 20 MG/ML IJ SOLN: 20.0000 mg | Freq: Once | INTRAMUSCULAR | Status: DC

## 2013-05-08 MED ORDER — OXYCODONE-ACETAMINOPHEN 10-325 MG PO TABS: 1.0000 | ORAL_TABLET | ORAL | Status: DC | PRN

## 2013-05-08 MED ORDER — NALBUPHINE HCL 20 MG/ML IJ SOLN
20.0000 mg | Freq: Once | INTRAMUSCULAR | Status: AC
  Administered 2013-05-08: 20 mg via INTRAMUSCULAR

## 2013-05-08 NOTE — Patient Instructions (Signed)
Migraine Headache A migraine headache is an intense, throbbing pain on one or both sides of your head. A migraine can last for 30 minutes to several hours. CAUSES  The exact cause of a migraine headache is not always known. However, a migraine may be caused when nerves in the brain become irritated and release chemicals that cause inflammation. This causes pain. Certain things may also trigger migraines, such as:  Alcohol.  Smoking.  Stress.  Menstruation.  Aged cheeses.  Foods or drinks that contain nitrates, glutamate, aspartame, or tyramine.  Lack of sleep.  Chocolate.  Caffeine.  Hunger.  Physical exertion.  Fatigue.  Medicines used to treat chest pain (nitroglycerine), birth control pills, estrogen, and some blood pressure medicines. SIGNS AND SYMPTOMS  Pain on one or both sides of your head.  Pulsating or throbbing pain.  Severe pain that prevents daily activities.  Pain that is aggravated by any physical activity.  Nausea, vomiting, or both.  Dizziness.  Pain with exposure to bright lights, loud noises, or activity.  General sensitivity to bright lights, loud noises, or smells. Before you get a migraine, you may get warning signs that a migraine is coming (aura). An aura may include:  Seeing flashing lights.  Seeing bright spots, halos, or zig-zag lines.  Having tunnel vision or blurred vision.  Having feelings of numbness or tingling.  Having trouble talking.  Having muscle weakness. DIAGNOSIS  A migraine headache is often diagnosed based on:  Symptoms.  Physical exam.  A CT scan or MRI of your head. These imaging tests cannot diagnose migraines, but they can help rule out other causes of headaches. TREATMENT Medicines may be given for pain and nausea. Medicines can also be given to help prevent recurrent migraines.  HOME CARE INSTRUCTIONS  Only take over-the-counter or prescription medicines for pain or discomfort as directed by your  health care provider. The use of long-term narcotics is not recommended.  Lie down in a dark, quiet room when you have a migraine.  Keep a journal to find out what may trigger your migraine headaches. For example, write down:  What you eat and drink.  How much sleep you get.  Any change to your diet or medicines.  Limit alcohol consumption.  Quit smoking if you smoke.  Get 7 9 hours of sleep, or as recommended by your health care provider.  Limit stress.  Keep lights dim if bright lights bother you and make your migraines worse. SEEK IMMEDIATE MEDICAL CARE IF:   Your migraine becomes severe.  You have a fever.  You have a stiff neck.  You have vision loss.  You have muscular weakness or loss of muscle control.  You start losing your balance or have trouble walking.  You feel faint or pass out.  You have severe symptoms that are different from your first symptoms. MAKE SURE YOU:   Understand these instructions.  Will watch your condition.  Will get help right away if you are not doing well or get worse. Document Released: 02/13/2005 Document Revised: 12/04/2012 Document Reviewed: 10/21/2012 [employer] Patient Information 2014 [employer].  

## 2013-05-08 NOTE — Progress Notes (Signed)
[facility] [address] [place] [address] [phone]  Date:  05/08/2013   Name:  [name]   DOB:  1966-01-27   MRN:  [id number]  PCP:  Default, Provider, MD    Chief Complaint: Migraine   History of Present Illness:  [name] is a 48 y.o. very pleasant female patient who presents with the following:  Seen previously for migraine/tension headache.  Has an appointment with neurologist in 2 weeks.  Has experienced a headache for the past two weeks.  Unrelenting and located on the left side of the head.  Has approximately 10 headache free days a month normally.  Says loud noises increase her headache but lights don't.  No nausea or vomiting.  No cough or coryza, fever or chills or history of prior head injury.  Previous CT was normal.   Has used her usual meds including hydrocodone with no relief of her headache.  Has maxalt but side effects interfere with meds.  Neurologist has recommended botox but she is afraid of the medication due concerns about side effects.  No improvement with over the counter medications or other home remedies. Denies other complaint or health concern today.   Patient Active Problem List   Diagnosis Date Noted  . S/P laparoscopic cholecystectomy 07/05/2011  . BREAST PAIN 05/19/2009  . CERVICAL STRAIN 05/19/2009  . ALLERGIC RHINITIS 06/22/2008  . HEADACHE 06/22/2008  . DEPRESSION, MAJOR, MODERATE 06/15/2008  . PARESTHESIA 06/15/2008  . PULMONARY NODULE 04/17/2007  . COUGH 04/17/2007  . CHEST PAIN-UNSPECIFIED 04/17/2007  . MENORRHAGIA 07/02/2006  . NOCTURIA 04/26/2006    Past Medical History  Diagnosis Date  . Depression   . Gall stones   . Chronic headaches   . Anxiety     Past Surgical History  Procedure Laterality Date  . Dilation and curettage of uterus    . Tonsillectomy    . Ercp  06/08/2011    Procedure: ENDOSCOPIC RETROGRADE CHOLANGIOPANCREATOGRAPHY (ERCP);  Surgeon: Theda Belfast, MD;  Location:  [facility] ENDOSCOPY;  Service: Endoscopy;  Laterality: N/A;  . Cholecystectomy  06/09/2011    Procedure: LAPAROSCOPIC CHOLECYSTECTOMY;  Surgeon: Liz Malady, MD;  Location: [facility] OR;  Service: General;  Laterality: N/A;    History  Substance Use Topics  . Smoking status: Never Smoker   . Smokeless tobacco: Never Used  . Alcohol Use: Yes     Comment: occasionally    Family History  Problem Relation Age of Onset  . Diabetes Mother   . Heart disease Mother     AMI  . Heart failure Father   . Heart disease Father     CHF with defibrillator.  . Hypertension Sister     Allergies  Allergen Reactions  . Oxycodone-Acetaminophen Itching    Medication list has been reviewed and updated.  Current Outpatient Prescriptions on File Prior to Visit  Medication Sig Dispense Refill  . etonogestrel (IMPLANON) 68 MG IMPL implant Inject 1 each into the skin once.      . cyclobenzaprine (FLEXERIL) 5 MG tablet Take 1 tablet (5 mg total) by mouth 3 (three) times daily as needed.  30 tablet  0  . HYDROcodone-acetaminophen (NORCO/VICODIN) 5-325 MG per tablet Take 1 tablet by mouth every 6 (six) hours as needed for moderate pain.  30 tablet  0  . venlafaxine XR (EFFEXOR-XR) 37.5 MG 24 hr capsule Take 1 capsule (37.5 mg total) by mouth daily with breakfast.  30 capsule  3  No current facility-administered medications on file prior to visit.    Review of Systems:  As per HPI, otherwise negative.    Physical Examination: Filed Vitals:   05/08/13 1118  BP: 120/80  Pulse: 85  Temp: 98.6 F (37 C)  Resp: 18   Filed Vitals:   05/08/13 1118  Height: 5' 3.5" (1.613 m)  Weight: 161 lb (73.029 kg)   Body mass index is 28.07 kg/(m^2). Ideal Body Weight: Weight in (lb) to have BMI = 25: 143.1  GEN: WDWN, moderate distress, crying., Non-toxic, A & O x 3 HEENT: Atraumatic, Normocephalic. Neck supple. No masses, No LAD. Ears and Nose: No external deformity. CV: RRR, No M/G/R. No JVD. No thrill. No  extra heart sounds. PULM: CTA B, no wheezes, crackles, rhonchi. No retractions. No resp. distress. No accessory muscle use. ABD: S, NT, ND, +BS. No rebound. No HSM. EXTR: No c/c/e NEURO Normal gait. PRRERLA EOMI CN 2-12 intact  Neck supple. PSYCH: Normally interactive. Conversant. Not depressed or anxious appearing.  Calm demeanor.    Assessment and Plan: Complex headache with elements of tension and migraine. Nubain 20 mg Promethazine 25 mg Percocet #20 as she had no relief with vicodin   Signed,  Phillips OdorJeffery Quintin Hjort, MD

## 2013-09-13 ENCOUNTER — Ambulatory Visit (INDEPENDENT_AMBULATORY_CARE_PROVIDER_SITE_OTHER): Payer: BC Managed Care – PPO | Admitting: Family Medicine

## 2013-09-13 VITALS — BP 104/78 | HR 89 | Temp 98.7°F | Resp 12 | Ht 62.28 in | Wt 166.5 lb

## 2013-09-13 DIAGNOSIS — F3289 Other specified depressive episodes: Secondary | ICD-10-CM

## 2013-09-13 DIAGNOSIS — F32A Depression, unspecified: Secondary | ICD-10-CM

## 2013-09-13 DIAGNOSIS — B353 Tinea pedis: Secondary | ICD-10-CM

## 2013-09-13 DIAGNOSIS — L309 Dermatitis, unspecified: Secondary | ICD-10-CM

## 2013-09-13 DIAGNOSIS — F329 Major depressive disorder, single episode, unspecified: Secondary | ICD-10-CM

## 2013-09-13 DIAGNOSIS — L259 Unspecified contact dermatitis, unspecified cause: Secondary | ICD-10-CM

## 2013-09-13 LAB — POCT SKIN KOH
Skin KOH, POC: NEGATIVE
Skin KOH, POC: NEGATIVE

## 2013-09-13 MED ORDER — TERBINAFINE HCL 250 MG PO TABS: 250.0000 mg | ORAL_TABLET | Freq: Every day | ORAL | Status: DC

## 2013-09-13 MED ORDER — FLUOXETINE HCL 20 MG PO TABS: 20.0000 mg | ORAL_TABLET | Freq: Every day | ORAL | Status: DC

## 2013-09-13 MED ORDER — CLOBETASOL PROPIONATE 0.05 % EX OINT: 1.0000 "application " | TOPICAL_OINTMENT | Freq: Two times a day (BID) | CUTANEOUS | Status: DC

## 2013-09-13 NOTE — Patient Instructions (Signed)
Take the terbinafine 250 mg one daily for 2 weeks for fungus  Use the clobetasol ointment twice daily on the rash on the hand  Use the clotrimazole/betamethasone cream on the foot twice daily for 7-10 days  If the foot is not looking better by 7-10 days from now, switched to trying the clobetasol ointment there also  Give the rash about one month, and return if not much better. If you wish to see me, which probably would be best to recheck the same rash, call and find out when I will be in the office and try and come in some time that you can while I am here and ask for me when you sign in.

## 2013-09-13 NOTE — Progress Notes (Signed)
Subjective: Patient has had a rash on her left hand for about the last year. She's also had a rash between her toes and around the toes on the left foot for approximately the same time. She has used several medications, including a generic clotrimazole/betamethasone ointment. She did not get relief. He comes and goes, but it gets bad it cracks. She uses some Neosporin ointment on it when she is cracking.  She's been under a lot of stress. She works. She is in a relationship which has been difficult for her. Her ex-husband does not pay his child support, she has a history of depression and has been on medications but is not on medications now.  Objective: Pleasant young lady, stress. Has an eczematoid dermatitis appearance of rash on second and third knuckles of her left hand, with some cracking skin and extension to to the PIP  She has a lot of rash between her toes on left foot flaking and peeling and dry skin on the ball of the foot which is flaking and scaling.   Results for orders placed in visit on 09/13/13  POCT SKIN KOH      Result Value Ref Range   Skin KOH, POC Negative    POCT SKIN KOH      Result Value Ref Range   Skin KOH, POC Negative     Assessment: Psoriatic eczema Probable tinea pedis in the skin scraping did not reveal fungal particles Depression and anxiety and stress  Plan: Topical clobetasol Topical clotrimazole/betamethasone on feet, which are he has Return if worse prozac

## 2013-12-13 ENCOUNTER — Ambulatory Visit (INDEPENDENT_AMBULATORY_CARE_PROVIDER_SITE_OTHER): Payer: BC Managed Care – PPO | Admitting: Family Medicine

## 2013-12-13 VITALS — BP 110/80 | HR 98 | Temp 98.4°F | Resp 16 | Ht 63.0 in | Wt 171.0 lb

## 2013-12-13 DIAGNOSIS — R300 Dysuria: Secondary | ICD-10-CM

## 2013-12-13 DIAGNOSIS — N3000 Acute cystitis without hematuria: Secondary | ICD-10-CM

## 2013-12-13 DIAGNOSIS — R350 Frequency of micturition: Secondary | ICD-10-CM

## 2013-12-13 LAB — POCT URINALYSIS DIPSTICK
BILIRUBIN UA: NEGATIVE
Glucose, UA: NEGATIVE
KETONES UA: NEGATIVE
Nitrite, UA: POSITIVE
PH UA: 5.5
PROTEIN UA: 100
Urobilinogen, UA: 0.2

## 2013-12-13 LAB — POCT UA - MICROSCOPIC ONLY
CASTS, UR, LPF, POC: NEGATIVE
Crystals, Ur, HPF, POC: NEGATIVE
MUCUS UA: NEGATIVE
WBC, UR, HPF, POC: 25.3
YEAST UA: NEGATIVE

## 2013-12-13 MED ORDER — SULFAMETHOXAZOLE-TMP DS 800-160 MG PO TABS: 1.0000 | ORAL_TABLET | Freq: Two times a day (BID) | ORAL | Status: DC

## 2013-12-13 NOTE — Patient Instructions (Signed)
We are going to treat you for a UTI with septra twice a day for one week.  Let me know if you do not feel better in the next couple of days- Sooner if worse.   I will be in touch with your urine culture

## 2013-12-13 NOTE — Progress Notes (Signed)
[facility] [address], [place] [address] [phone]  Date:  12/13/2013   Name:  [name]   DOB:  02/11/1966   MRN:  [id number]  PCP:  Default, Provider, MD    Chief Complaint: Urinary Frequency and Dysuria   History of Present Illness:  Roberta  BingMoore [name] is a 48 y.o. very pleasant female patient who presents with the following:  Here today with possible UTI. She has noted urinary frequency and pain since yesterday, and a little bit of blood on the tissue when she wipes after urination.  She has not noted a fever, mild back pain.  No vomiting or abdominal pain.   No vaginal sx noted.  She had her nexplanon taken out earlier this week and received a depo shot then as well.  She is otherwise generally healthy  Patient Active Problem List   Diagnosis Date Noted  . S/P laparoscopic cholecystectomy 07/05/2011  . BREAST PAIN 05/19/2009  . CERVICAL STRAIN 05/19/2009  . ALLERGIC RHINITIS 06/22/2008  . HEADACHE 06/22/2008  . DEPRESSION, MAJOR, MODERATE 06/15/2008  . PARESTHESIA 06/15/2008  . PULMONARY NODULE 04/17/2007  . COUGH 04/17/2007  . CHEST PAIN-UNSPECIFIED 04/17/2007  . MENORRHAGIA 07/02/2006  . NOCTURIA 04/26/2006    Past Medical History  Diagnosis Date  . Depression   . Gall stones   . Chronic headaches   . Anxiety     Past Surgical History  Procedure Laterality Date  . Dilation and curettage of uterus    . Tonsillectomy    . Ercp  06/08/2011    Procedure: ENDOSCOPIC RETROGRADE CHOLANGIOPANCREATOGRAPHY (ERCP);  Surgeon: Theda BelfastPatrick D Hung, MD;  [facility];  Service: Endoscopy;  Laterality: N/A;  . Cholecystectomy  06/09/2011    Procedure: LAPAROSCOPIC CHOLECYSTECTOMY;  Surgeon: Liz MaladyBurke E Thompson, MD;  [facility];  Service: General;  Laterality: N/A;    History  Substance Use Topics  . Smoking status: Never Smoker   . Smokeless tobacco: Never Used  . Alcohol Use: Yes     Comment: occasionally    Family  History  Problem Relation Age of Onset  . Diabetes Mother   . Heart disease Mother     AMI  . Heart failure Father   . Heart disease Father     CHF with defibrillator.  . Hypertension Sister     Allergies  Allergen Reactions  . Oxycodone-Acetaminophen Itching    Medication list has been reviewed and updated.  Current Outpatient Prescriptions on File Prior to Visit  Medication Sig Dispense Refill  . etodolac (LODINE) 500 MG tablet Take 500 mg by mouth 2 (two) times daily as needed.       Marland Kitchen. FLUoxetine (PROZAC) 20 MG tablet Take 1 tablet (20 mg total) by mouth daily.  30 tablet  1  . topiramate (TOPAMAX) 25 MG tablet Take 25 mg by mouth 2 (two) times daily.       No current facility-administered medications on file prior to visit.    Review of Systems:  As per HPI- otherwise negative.   Physical Examination: Filed Vitals:   12/13/13 1251  BP: 110/80  Pulse: 98  Temp: 98.4 F (36.9 C)  Resp: 16   Filed Vitals:   12/13/13 1251  Height: 5\' 3"  (1.6 m)  Weight: 171 lb (77.565 kg)   Body mass index is 30.3 kg/(m^2). Ideal Body Weight: Weight in (lb) to have BMI = 25: 140.8  GEN: WDWN, NAD, Non-toxic, A & O x  3, overweight, looks well HEENT: Atraumatic, Normocephalic. Neck supple. No masses, No LAD. Ears and Nose: No external deformity. CV: RRR, No M/G/R. No JVD. No thrill. No extra heart sounds. PULM: CTA B, no wheezes, crackles, rhonchi. No retractions. No resp. distress. No accessory muscle use. ABD: S, NT, ND, +BS. No rebound. No HSM. EXTR: No c/c/e NEURO Normal gait.  No CVA tenderness  PSYCH: Normally interactive. Conversant. Not depressed or anxious appearing.  Calm demeanor.    Results for orders placed in visit on 12/13/13  POCT URINALYSIS DIPSTICK      Result Value Ref Range   Color, UA yellow     Clarity, UA cloudy     Glucose, UA neg     Bilirubin, UA neg     Ketones, UA neg     Spec Grav, UA >=1.030     Blood, UA moderate     pH, UA 5.5      Protein, UA 100     Urobilinogen, UA 0.2     Nitrite, UA positive     Leukocytes, UA small (1+)    POCT UA - MICROSCOPIC ONLY      Result Value Ref Range   WBC, Ur, HPF, POC 25.30     RBC, urine, microscopic 10-15     Bacteria, U Microscopic trace     Mucus, UA neg     Epithelial cells, urine per micros 0-5     Crystals, Ur, HPF, POC neg     Casts, Ur, LPF, POC neg     Yeast, UA neg      Assessment and Plan: Acute cystitis without hematuria - Plan: Urine culture, sulfamethoxazole-trimethoprim (BACTRIM DS) 800-160 MG per tablet  Dysuria - Plan: POCT urinalysis dipstick, POCT UA - Microscopic Only  Urinary frequency - Plan: POCT urinalysis dipstick, POCT UA - Microscopic Only  Treat for UTI with septra.  Avoid quinolone is possible as she does have a history of long QTc on EKG.   Await culture and will follow-up with her.    Signed Abbe AmsterdamJessica Dontavion Noxon, MD

## 2013-12-16 LAB — URINE CULTURE: Colony Count: >=100000

## 2014-01-29 ENCOUNTER — Ambulatory Visit (INDEPENDENT_AMBULATORY_CARE_PROVIDER_SITE_OTHER): Payer: BC Managed Care – PPO | Admitting: Emergency Medicine

## 2014-01-29 VITALS — BP 106/86 | HR 80 | Temp 98.6°F | Resp 18 | Wt 173.0 lb

## 2014-01-29 DIAGNOSIS — M722 Plantar fascial fibromatosis: Secondary | ICD-10-CM

## 2014-01-29 DIAGNOSIS — S39012A Strain of muscle, fascia and tendon of lower back, initial encounter: Secondary | ICD-10-CM

## 2014-01-29 DIAGNOSIS — Z23 Encounter for immunization: Secondary | ICD-10-CM

## 2014-01-29 MED ORDER — NAPROXEN SODIUM 550 MG PO TABS: 550.0000 mg | ORAL_TABLET | Freq: Two times a day (BID) | ORAL | Status: DC

## 2014-01-29 MED ORDER — CYCLOBENZAPRINE HCL 10 MG PO TABS: 10.0000 mg | ORAL_TABLET | Freq: Three times a day (TID) | ORAL | Status: DC | PRN

## 2014-01-29 MED ORDER — ACETAMINOPHEN-CODEINE #3 300-30 MG PO TABS: 1.0000 | ORAL_TABLET | ORAL | Status: DC | PRN

## 2014-01-29 NOTE — Patient Instructions (Signed)

## 2014-01-29 NOTE — Progress Notes (Signed)
[facility] [address], [place] [address] [phone]  Date:  01/29/2014   Name:  [name]   DOB:  06/02/1965   MRN:  [id number]  PCP:  Default, Provider, MD    Chief Complaint: Back Pain and Foot Pain   History of Present Illness:  [name] is a 48 y.o. very pleasant female patient who presents with the following:  Multiple complaints Has intermittent low back pain with an exacerbation that is non radiating.  No associated neuro symptoms. History of prior back injury lifting about 5 years. Has pain in both heels after arising in the morning that diminishes over the day Has used numerous inserts and insoles. No improvement with over the counter medications or other home remedies.  Denies other complaint or health concern today.   Patient Active Problem List   Diagnosis Date Noted  . S/P laparoscopic cholecystectomy 07/05/2011  . BREAST PAIN 05/19/2009  . CERVICAL STRAIN 05/19/2009  . ALLERGIC RHINITIS 06/22/2008  . HEADACHE 06/22/2008  . DEPRESSION, MAJOR, MODERATE 06/15/2008  . PARESTHESIA 06/15/2008  . PULMONARY NODULE 04/17/2007  . COUGH 04/17/2007  . CHEST PAIN-UNSPECIFIED 04/17/2007  . MENORRHAGIA 07/02/2006  . NOCTURIA 04/26/2006    Past Medical History  Diagnosis Date  . Depression   . Gall stones   . Chronic headaches   . Anxiety     Past Surgical History  Procedure Laterality Date  . Dilation and curettage of uterus    . Tonsillectomy    . Ercp  06/08/2011    Procedure: ENDOSCOPIC RETROGRADE CHOLANGIOPANCREATOGRAPHY (ERCP);  Surgeon: Theda BelfastPatrick D Hung, MD;  [facility];  Service: Endoscopy;  Laterality: N/A;  . Cholecystectomy  06/09/2011    Procedure: LAPAROSCOPIC CHOLECYSTECTOMY;  Surgeon: Liz MaladyBurke E Thompson, MD;  [facility];  Service: General;  Laterality: N/A;    History  Substance Use Topics  . Smoking status: Never Smoker   . Smokeless tobacco: Never Used  . Alcohol Use: Yes   Comment: occasionally    Family History  Problem Relation Age of Onset  . Diabetes Mother   . Heart disease Mother     AMI  . Heart failure Father   . Heart disease Father     CHF with defibrillator.  . Hypertension Sister     Allergies  Allergen Reactions  . Oxycodone-Acetaminophen Itching    Medication list has been reviewed and updated.  No current outpatient prescriptions on file prior to visit.   No current facility-administered medications on file prior to visit.    Review of Systems:  As per HPI, otherwise negative.    Physical Examination: Filed Vitals:   01/29/14 1020  BP: 106/86  Pulse: 80  Temp: 98.6 F (37 C)  Resp: 18   Filed Vitals:   01/29/14 1020  Weight: 173 lb (78.472 kg)   Body mass index is 30.65 kg/(m^2). Ideal Body Weight:     GEN: WDWN, NAD, Non-toxic, Alert & Oriented x 3 HEENT: Atraumatic, Normocephalic.  Ears and Nose: No external deformity. EXTR: No clubbing/cyanosis/edema NEURO: Normal gait.  PSYCH: Normally interactive. Conversant. Not depressed or anxious appearing.  Calm demeanor.  BACK  Tender spot on left lumbosacral region Neuro intact  Assessment and Plan: Lumbosacral strain Anaprox Flexeril tyl  #3 Plantar fasciitis Podiatry  Signed,  Phillips OdorJeffery Mahmood Boehringer, MD

## 2014-01-29 NOTE — Addendum Note (Signed)
Addended by: Felix AhmadiFRANSEN, Aman Bonet A on: 01/29/2014 11:06 AM   Modules accepted: Orders

## 2014-01-29 NOTE — Addendum Note (Signed)
Addended by: Carmelina DaneANDERSON, Korbyn Vanes S on: 01/29/2014 10:52 AM   Modules accepted: Orders

## 2014-02-01 ENCOUNTER — Ambulatory Visit (INDEPENDENT_AMBULATORY_CARE_PROVIDER_SITE_OTHER): Payer: BC Managed Care – PPO

## 2014-02-01 ENCOUNTER — Ambulatory Visit (INDEPENDENT_AMBULATORY_CARE_PROVIDER_SITE_OTHER): Payer: BC Managed Care – PPO | Admitting: Family Medicine

## 2014-02-01 VITALS — BP 118/84 | HR 90 | Temp 98.5°F | Resp 20 | Ht 62.5 in | Wt 171.2 lb

## 2014-02-01 DIAGNOSIS — R1084 Generalized abdominal pain: Secondary | ICD-10-CM

## 2014-02-01 DIAGNOSIS — K5900 Constipation, unspecified: Secondary | ICD-10-CM

## 2014-02-01 LAB — POCT CBC
GRANULOCYTE PERCENT: 64.1 % (ref 37–80)
HEMATOCRIT: 43.2 % (ref 37.7–47.9)
Hemoglobin: 14.4 g/dL (ref 12.2–16.2)
Lymph, poc: 2 (ref 0.6–3.4)
MCH, POC: 31.5 pg — AB (ref 27–31.2)
MCHC: 33.3 g/dL (ref 31.8–35.4)
MCV: 94.6 fL (ref 80–97)
MID (cbc): 0.7 (ref 0–0.9)
MPV: 6.9 fL (ref 0–99.8)
POC Granulocyte: 4.56 (ref 2–6.9)
POC LYMPH PERCENT: 26.9 %L (ref 10–50)
POC MID %: 9 %M (ref 0–12)
Platelet Count, POC: 200 10*3/uL (ref 142–424)
RBC: 4.56 M/uL (ref 4.04–5.48)
RDW, POC: 12.8 %
WBC: 7.6 10*3/uL (ref 4.6–10.2)

## 2014-02-01 MED ORDER — POLYETHYLENE GLYCOL 3350 17 GM/SCOOP PO POWD: 17.0000 g | Freq: Two times a day (BID) | ORAL | Status: DC | PRN

## 2014-02-01 NOTE — Progress Notes (Signed)
History and physical examinations reviewed in detail with Tishira Brewington, PA-C. KUB reviewed.  Agree with assessment and plan.

## 2014-02-01 NOTE — Patient Instructions (Signed)

## 2014-02-01 NOTE — Progress Notes (Signed)
Subjective:    Patient ID: [name], female    DOB: 08/10/1965, 48 y.o.   MRN: [id number]  HPI Patient presents for 4 days of abdominal pain that is intermittent and mostly achy, but can be sharp. Pain is located as a band around epigastric region and radiates up back. Belly feels bloated, but denies belching or flatulence. Feels nauseous,especially after meals, but no vomiting. Bowel movements are are every 2-3 days and are not painful, but does have to strain. No blood in stool. Only thing new/different that she has ingested is Tylenol with codeine that is for lower back strain. Stopped taking to see if there was any improvement, but there was no change. No recent travel outside of the country, no new foods tried, but does eat out daily. Does not drink much water. No food allergies. Denies fever, fatigue, vaginal, or urinary sx. Does not have menstrual cycle due to birth control.  Cholecystecomy performed 2013.   Review of Systems  Constitutional: Positive for appetite change (not as hungry, but still able to eat). Negative for fever, chills and fatigue.  Respiratory: Negative for shortness of breath.   Cardiovascular: Negative for chest pain.  Gastrointestinal: Positive for nausea, abdominal pain and abdominal distention. Negative for vomiting, diarrhea, constipation and blood in stool.  Genitourinary: Negative.   Musculoskeletal: Positive for back pain (upper back; separate from baseline lumbar pain).  Allergic/Immunologic: Negative for environmental allergies and food allergies.  Neurological: Positive for headaches (once). Negative for dizziness and light-headedness.       Objective:   Physical Exam  Constitutional: She is oriented to person, place, and time. She appears well-developed and well-nourished. No distress.  Blood pressure 118/84, pulse 90, temperature 98.5 F (36.9 C), temperature source Oral, resp. rate 20, height 5' 2.5" (1.588 m), weight 171 lb 3.2 oz (77.656  kg), SpO2 99 %.   HENT:  Head: Normocephalic and atraumatic.  Cardiovascular: Normal rate, regular rhythm and normal heart sounds.  Exam reveals no gallop and no friction rub.   No murmur heard. Pulmonary/Chest: Effort normal and breath sounds normal. She has no wheezes. She has no rales. She exhibits no tenderness.  Abdominal: Soft. Normal appearance and bowel sounds are normal. She exhibits no distension and no mass. There is generalized tenderness. There is no rebound, no guarding, no CVA tenderness and no tenderness at McBurney's point. No hernia.  Neurological: She is alert and oriented to person, place, and time.  Skin: Skin is warm and dry. No rash noted. She is not diaphoretic. No erythema. No pallor.   [facility] reading (PRIMARY) by  Dr. Katrinka BlazingSmith. Increased stool burden.  Results for orders placed or performed in visit on 02/01/14  POCT CBC  Result Value Ref Range   WBC 7.6 4.6 - 10.2 K/uL   Lymph, poc 2.0 0.6 - 3.4   POC LYMPH PERCENT 26.9 10 - 50 %L   MID (cbc) 0.7 0 - 0.9   POC MID % 9.0 0 - 12 %M   POC Granulocyte 4.56 2 - 6.9   Granulocyte percent 64.1 37 - 80 %G   RBC 4.56 4.04 - 5.48 M/uL   Hemoglobin 14.4 12.2 - 16.2 g/dL   HCT, POC 90.243.2 40.937.7 - 47.9 %   MCV 94.6 80 - 97 fL   MCH, POC 31.5 (A) 27 - 31.2 pg   MCHC 33.3 31.8 - 35.4 g/dL   RDW, POC 73.512.8 %   Platelet Count, POC 200 142 - 424 K/uL  MPV 6.9 0 - 99.8 fL       Assessment & Plan:  1. Constipation, unspecified constipation type 2. Generalized abdominal pain - POCT CBC - Comprehensive metabolic panel - DG Abd 1 View; Future - polyethylene glycol powder (GLYCOLAX/MIRALAX) powder; Take 17 g by mouth 2 (two) times daily as needed.  Dispense: 3350 g; Refill: 1 - Increase water intake to 64 oz daily.   Janan Ridgeishira Ariyannah Pauling PA-C  [facility] 02/01/2014 4:24 PM

## 2014-02-02 LAB — COMPREHENSIVE METABOLIC PANEL
ALBUMIN: 3.9 g/dL (ref 3.5–5.2)
ALT: 18 U/L (ref 0–35)
AST: 16 U/L (ref 0–37)
Alkaline Phosphatase: 68 U/L (ref 39–117)
BUN: 8 mg/dL (ref 6–23)
CHLORIDE: 106 meq/L (ref 96–112)
CO2: 26 mEq/L (ref 19–32)
Calcium: 10.1 mg/dL (ref 8.4–10.5)
Creat: 0.76 mg/dL (ref 0.50–1.10)
GLUCOSE: 74 mg/dL (ref 70–99)
POTASSIUM: 3.9 meq/L (ref 3.5–5.3)
Sodium: 142 mEq/L (ref 135–145)
TOTAL PROTEIN: 6.5 g/dL (ref 6.0–8.3)
Total Bilirubin: 0.5 mg/dL (ref 0.2–1.2)

## 2014-02-03 ENCOUNTER — Encounter: Payer: Self-pay | Admitting: Physician Assistant

## 2014-02-13 ENCOUNTER — Emergency Department (HOSPITAL_COMMUNITY)
Admission: EM | Admit: 2014-02-13 | Discharge: 2014-02-13 | Disposition: A | Discharge status: Home / Self Care | Payer: BC Managed Care – PPO | Attending: Emergency Medicine | Admitting: Emergency Medicine

## 2014-02-13 ENCOUNTER — Encounter (HOSPITAL_COMMUNITY): Payer: Self-pay

## 2014-02-13 DIAGNOSIS — Z8719 Personal history of other diseases of the digestive system: Secondary | ICD-10-CM | POA: Diagnosis not present

## 2014-02-13 DIAGNOSIS — Z8659 Personal history of other mental and behavioral disorders: Secondary | ICD-10-CM | POA: Diagnosis not present

## 2014-02-13 DIAGNOSIS — K5900 Constipation, unspecified: Secondary | ICD-10-CM | POA: Insufficient documentation

## 2014-02-13 DIAGNOSIS — M545 Low back pain, unspecified: Secondary | ICD-10-CM

## 2014-02-13 DIAGNOSIS — Z791 Long term (current) use of non-steroidal anti-inflammatories (NSAID): Secondary | ICD-10-CM | POA: Insufficient documentation

## 2014-02-13 DIAGNOSIS — Z87440 Personal history of urinary (tract) infections: Secondary | ICD-10-CM | POA: Insufficient documentation

## 2014-02-13 DIAGNOSIS — R300 Dysuria: Secondary | ICD-10-CM | POA: Insufficient documentation

## 2014-02-13 DIAGNOSIS — G8911 Acute pain due to trauma: Secondary | ICD-10-CM | POA: Insufficient documentation

## 2014-02-13 MED ORDER — HYDROCODONE-ACETAMINOPHEN 5-325 MG PO TABS
1.0000 | ORAL_TABLET | Freq: Once | ORAL | Status: AC
  Administered 2014-02-13: 1 via ORAL
  Filled 2014-02-13: qty 1

## 2014-02-13 MED ORDER — HYDROCODONE-ACETAMINOPHEN 5-325 MG PO TABS: 1.0000 | ORAL_TABLET | Freq: Four times a day (QID) | ORAL | Status: DC | PRN

## 2014-02-13 NOTE — ED Notes (Signed)
Pt does states some burning with urination with hx of UTI

## 2014-02-13 NOTE — ED Provider Notes (Signed)
CSN: [id number]     Arrival date & time 02/13/14  1647 History  This chart was scribed for Fayrene HelperBowie Davidson Palmieri, PA-C with Purvis SheffieldForrest Harrison, MD by Tonye RoyaltyJoshua Chen, ED Scribe. This patient was seen in room WTR5/WTR5 and the patient's care was started at 5:52 PM.    Chief Complaint  Patient presents with  . Back Pain   The history is provided by the patient. No language interpreter was used.    HPI Comments: [name] is a 48 y.o. female who presents to the Emergency Department complaining of low back pain with onset 2 weeks ago, worsening progressively.  She states she has a dull pain all the way across her back but there is one point that has greater pain. 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 She states she went to an urgent care 2 weeks ago where she was diagnosed with lumbar sprain and prescribed Tylenol 3 and muscle relaxer. She states the muscle relaxer helps by making her sleepy, but Tylenol 3 does not improve symptoms. She states pain has progressively worsened and rates pain at 9/10 at this time. She describes pain as sharp but sometimes dull and aching. She denies radiation of pain to her legs but notes some pain radiating to her upper back. She reports some associated dysuria today. She states she has had UTIs before and states this feels somewhat similar. She notes chronic frequent urination and constipation. She denies history of IVDA or cancer.. She states she saw a chiropractor at 1100 today and had an x-ray done but does not have the results back yet. She denies hematuria, new frequency, incontinence, bowel changes, numbness, or fever.   Past Medical History  Diagnosis Date  . Depression   . Gall stones   . Chronic headaches   . Anxiety    Past Surgical History  Procedure Laterality Date  . Dilation and curettage of uterus    . Tonsillectomy    . Ercp  06/08/2011    Procedure:  ENDOSCOPIC RETROGRADE CHOLANGIOPANCREATOGRAPHY (ERCP);  Surgeon: Theda BelfastPatrick D Hung, MD;  [facility];  Service: Endoscopy;  Laterality: N/A;  . Cholecystectomy  06/09/2011    Procedure: LAPAROSCOPIC CHOLECYSTECTOMY;  Surgeon: Liz MaladyBurke E Thompson, MD;  [facility];  Service: General;  Laterality: N/A;   Family History  Problem Relation Age of Onset  . Diabetes Mother   . Heart disease Mother     AMI  . Heart failure Father   . Heart disease Father     CHF with defibrillator.  . Hypertension Sister    History  Substance Use Topics  . Smoking status: Never Smoker   . Smokeless tobacco: Never Used  . Alcohol Use: Yes     Comment: occasionally   OB History    No data available     Review of Systems  Constitutional: Negative for fever.  Genitourinary: Positive for dysuria. Negative for frequency and hematuria.  Musculoskeletal: Positive for back pain.  Neurological: Negative for numbness.      Allergies  Oxycodone-acetaminophen  Home Medications   Prior to Admission medications   Medication Sig Start Date End Date Taking? Authorizing Provider  acetaminophen-codeine (TYLENOL #3) 300-30 MG per tablet Take 1-2 tablets by mouth every 4 (four) hours as needed. 01/29/14   Carmelina DaneJeffery S Anderson, MD  cyclobenzaprine (FLEXERIL) 10 MG tablet Take 1 tablet (10 mg total) by mouth 3 (three) times  daily as needed for muscle spasms. 01/29/14   Carmelina DaneJeffery S Anderson, MD  naproxen sodium (ANAPROX DS) 550 MG tablet Take 1 tablet (550 mg total) by mouth 2 (two) times daily with a meal. 01/29/14 01/29/15  Carmelina DaneJeffery S Anderson, MD  polyethylene glycol powder (GLYCOLAX/MIRALAX) powder Take 17 g by mouth 2 (two) times daily as needed. 02/01/14   Tishira R Brewington, PA-C   BP 127/65 mmHg  Pulse 101  Temp(Src) 98.6 F (37 C) (Oral)  Resp 18  SpO2 98% Physical Exam  Constitutional: She is oriented to person, place, and time. She appears well-developed and well-nourished.  HENT:  Head:  Normocephalic and atraumatic.  Eyes: Conjunctivae are normal.  Neck: Normal range of motion. Neck supple.  Pulmonary/Chest: Effort normal.  Musculoskeletal: Normal range of motion.  Tenderness to paralumbar spinal muscle Increasing lumbar spine tenderness with flextion Increasing pain with back rotation No foot drop Patient able to ambulate  Neurological: She is alert and oriented to person, place, and time.  Patellar DTR intact  Skin: Skin is warm and dry.  Psychiatric: She has a normal mood and affect.  Nursing note and vitals reviewed.   ED Course  Procedures (including critical care time)  DIAGNOSTIC STUDIES: Oxygen Saturation is 99% on room air, normal by my interpretation.    COORDINATION OF CARE: 6:00 PM Discussed with the patient that imaging is unlikely to reveal anything significant since there was no injury and her pain is more likely muscular. Discussed that her symptoms do not sound like they are caused by UTI or kidney stone. Since she has tried muscle relaxers and Tylenol 3 already without improvement, I recommended that she follow up with an orthopedist and will provide some pain medication. Since she has good nerve conduction and is able to ambulate, doubt that he has nerve compression or infection.    Labs Review Labs Reviewed - No data to display  Imaging Review No results found.   EKG Interpretation None      MDM   Final diagnoses:  Midline low back pain without sciatica    BP 127/65 mmHg  Pulse 101  Temp(Src) 98.6 F (37 C) (Oral)  Resp 18  SpO2 98%   I personally performed the services described in this documentation, which was scribed in my presence. The recorded information has been reviewed and is accurate.     Fayrene HelperBowie Seaborn Nakama, PA-C 02/13/14 1849  Purvis SheffieldForrest Harrison, MD 02/14/14 [phone]

## 2014-02-13 NOTE — Discharge Instructions (Signed)
Back Pain, Adult °Back pain is very common. The pain often gets better over time. The cause of back pain is usually not dangerous. Most people can learn to manage their back pain on their own.  °HOME CARE  °· Stay active. Start with short walks on flat ground if you can. Try to walk farther each day. °· Do not sit, drive, or stand in one place for more than 30 minutes. Do not stay in bed. °· Do not avoid exercise or work. Activity can help your back heal faster. °· Be careful when you bend or lift an object. Bend at your knees, keep the object close to you, and do not twist. °· Sleep on a firm mattress. Lie on your side, and bend your knees. If you lie on your back, put a pillow under your knees. °· Only take medicines as told by your doctor. °· Put ice on the injured area. °¨ Put ice in a plastic bag. °¨ Place a towel between your skin and the bag. °¨ Leave the ice on for 15-20 minutes, 03-04 times a day for the first 2 to 3 days. After that, you can switch between ice and heat packs. °· Ask your doctor about back exercises or massage. °· Avoid feeling anxious or stressed. Find good ways to deal with stress, such as exercise. °GET HELP RIGHT AWAY IF:  °· Your pain does not go away with rest or medicine. °· Your pain does not go away in 1 week. °· You have new problems. °· You do not feel well. °· The pain spreads into your legs. °· You cannot control when you poop (bowel movement) or pee (urinate). °· Your arms or legs feel weak or lose feeling (numbness). °· You feel sick to your stomach (nauseous) or throw up (vomit). °· You have belly (abdominal) pain. °· You feel like you may pass out (faint). °MAKE SURE YOU:  °· Understand these instructions. °· Will watch your condition. °· Will get help right away if you are not doing well or get worse. °Document Released: 08/02/2007 Document Revised: 05/08/2011 Document Reviewed: 06/17/2013 °[employer]® Patient Information ©2015 [employer]. This information is not intended  to replace advice given to you by your health care provider. Make sure you discuss any questions you have with your health care provider. ° °

## 2014-02-13 NOTE — ED Notes (Signed)
Per pt, having back pain for 2 weeks.  Went to urgent care and given meds.  Pt does not recall trauma.  Pain continues even though taking meds.  No change in urination.  No fever.

## 2014-03-03 ENCOUNTER — Ambulatory Visit: Payer: BC Managed Care – PPO | Admitting: Podiatry

## 2014-03-31 ENCOUNTER — Ambulatory Visit (INDEPENDENT_AMBULATORY_CARE_PROVIDER_SITE_OTHER): Payer: BLUE CROSS/BLUE SHIELD

## 2014-03-31 VITALS — BP 118/78 | HR 78 | Resp 12

## 2014-03-31 DIAGNOSIS — M722 Plantar fascial fibromatosis: Secondary | ICD-10-CM

## 2014-03-31 DIAGNOSIS — M7730 Calcaneal spur, unspecified foot: Secondary | ICD-10-CM

## 2014-03-31 DIAGNOSIS — R52 Pain, unspecified: Secondary | ICD-10-CM

## 2014-03-31 DIAGNOSIS — M2040 Other hammer toe(s) (acquired), unspecified foot: Secondary | ICD-10-CM

## 2014-03-31 DIAGNOSIS — Q828 Other specified congenital malformations of skin: Secondary | ICD-10-CM

## 2014-03-31 MED ORDER — MELOXICAM 15 MG PO TABS: 15.0000 mg | ORAL_TABLET | Freq: Every day | ORAL | Status: DC

## 2014-03-31 NOTE — Patient Instructions (Signed)
ICE INSTRUCTIONS  Apply ice or cold pack to the affected area at least 3 times a day for 10-15 minutes each time.  You should also use ice after prolonged activity or vigorous exercise.  Do not apply ice longer than 20 minutes at one time.  Always keep a cloth between your skin and the ice pack to prevent burns.  Being consistent and following these instructions will help control your symptoms.  We suggest you purchase a gel ice pack because they are reusable and do bit leak.  Some of them are designed to wrap around the area.  Use the method that works best for you.  Here are some other suggestions for icing.   Use a frozen bag of peas or corn-inexpensive and molds well to your body, usually stays frozen for 10 to 20 minutes.  Wet a towel with cold water and squeeze out the excess until it's damp.  Place in a bag in the freezer for 20 minutes. Then remove and use.   Recommendations for shoes or athletic shoes. The most recommended brands are New Balance, [employer] athletic shoes, [employer]ICS running shoes  Good places to purchase shoes would be [employer], [employer], or [employer]

## 2014-03-31 NOTE — Progress Notes (Signed)
   Subjective:    Patient ID: [name], female    DOB: 02/23/1966, 49 y.o.   MRN: [id number]  HPI  PT STATED B/L BOTTOM FEET AND TOES HAVE HARD SKIN BEEN HURTING ABOUT 1 YEAR. FEET ARE HURTING WORSE SOME DAYS AND GET AGGRAVATED FIRST THING IN THE MORNING. TRIED NO TREATMENT.  Review of Systems  Musculoskeletal: Positive for back pain.  Neurological: Positive for headaches.  All other systems reviewed and are negative.      Objective:   Physical Exam 49 year old white female well-developed well-nourished oriented 3 presents at this time with several complaints first being her feet hurt bottoms of her feet that she had first up in the morning or getting a fall. Of rest Rector prolonged activity she is working 2 jobs. Has been wearing things like sketchers different shoes . Some of the shoes may have been soft. Lotion objective findings reveal neurovascular status to be intact pedal pulses palpable DP and PT +2 over 4 Refill time 3 seconds epicritic and proprioceptive sensations intact and symmetric there is normal plantar RESPONSE [name] not elicited neurologically skin color pigment normal hair growth absent nails unremarkable orthopedic exam rectus foot type with semirigid digital contractures adductovarus rotation lesser digits 34 and 5 there is a medial keratoses of the fifth digit right foot due to under lapping fifth toe. There is no open wounds no ulcers no secondary infections there is definite pain on palpation of the medial band plantar fascia from medial calcaneal tubercle to the inferior middle arch. Trees reveal well-developed thickening of the plantar fascia bilateral mild inferior retrocalcaneal spurs are identified rectus foot type with adductovarus rotation of digits noted.       Assessment & Plan:  Assessment plantar fasciitis/heel spur syndrome bilateral mid arch pain most significant posse some early hammertoe deformity with a keratoses fifth right which is debrided at  this time. Tube foam padding dispensed for the hammertoe and be candidate for future is invasive options however this time fascial strapping applied to both feet prescription for meloxicam is given recommend ice to the affected area will reappoint in 2 weeks for follow-up and reevaluation may be candidate for orthoses based on progress next that issue prescription for meloxicam at this time.  Alvan Dameichard Kaeya Schiffer DPM

## 2014-04-14 ENCOUNTER — Ambulatory Visit: Payer: BLUE CROSS/BLUE SHIELD

## 2014-04-21 ENCOUNTER — Emergency Department (HOSPITAL_COMMUNITY): Payer: BLUE CROSS/BLUE SHIELD

## 2014-04-21 ENCOUNTER — Emergency Department (HOSPITAL_COMMUNITY)
Admission: EM | Admit: 2014-04-21 | Discharge: 2014-04-21 | Disposition: A | Discharge status: Home / Self Care | Payer: BLUE CROSS/BLUE SHIELD | Attending: Emergency Medicine | Admitting: Emergency Medicine

## 2014-04-21 ENCOUNTER — Ambulatory Visit: Payer: BLUE CROSS/BLUE SHIELD

## 2014-04-21 DIAGNOSIS — R109 Unspecified abdominal pain: Secondary | ICD-10-CM

## 2014-04-21 DIAGNOSIS — K5901 Slow transit constipation: Secondary | ICD-10-CM

## 2014-04-21 DIAGNOSIS — Z8659 Personal history of other mental and behavioral disorders: Secondary | ICD-10-CM | POA: Insufficient documentation

## 2014-04-21 DIAGNOSIS — R1011 Right upper quadrant pain: Secondary | ICD-10-CM | POA: Diagnosis present

## 2014-04-21 DIAGNOSIS — Z791 Long term (current) use of non-steroidal anti-inflammatories (NSAID): Secondary | ICD-10-CM | POA: Diagnosis not present

## 2014-04-21 DIAGNOSIS — Z3202 Encounter for pregnancy test, result negative: Secondary | ICD-10-CM | POA: Diagnosis not present

## 2014-04-21 LAB — CBC
HEMATOCRIT: 43.2 % (ref 36.0–46.0)
Hemoglobin: 14.1 g/dL (ref 12.0–15.0)
MCH: 31.5 pg (ref 26.0–34.0)
MCHC: 32.6 g/dL (ref 30.0–36.0)
MCV: 96.6 fL (ref 78.0–100.0)
PLATELETS: 229 10*3/uL (ref 150–400)
RBC: 4.47 MIL/uL (ref 3.87–5.11)
RDW: 12.8 % (ref 11.5–15.5)
WBC: 10.3 10*3/uL (ref 4.0–10.5)

## 2014-04-21 LAB — URINALYSIS, ROUTINE W REFLEX MICROSCOPIC
Bilirubin Urine: NEGATIVE
GLUCOSE, UA: NEGATIVE mg/dL
KETONES UR: NEGATIVE mg/dL
LEUKOCYTES UA: NEGATIVE
NITRITE: NEGATIVE
Protein, ur: NEGATIVE mg/dL
Specific Gravity, Urine: 1.027 (ref 1.005–1.030)
Urobilinogen, UA: 0.2 mg/dL (ref 0.0–1.0)
pH: 5.5 (ref 5.0–8.0)

## 2014-04-21 LAB — COMPREHENSIVE METABOLIC PANEL
ALK PHOS: 60 U/L (ref 39–117)
ALT: 22 U/L (ref 0–35)
AST: 24 U/L (ref 0–37)
Albumin: 3.8 g/dL (ref 3.5–5.2)
Anion gap: 5 (ref 5–15)
BUN: 10 mg/dL (ref 6–23)
CALCIUM: 9.7 mg/dL (ref 8.4–10.5)
CO2: 26 mmol/L (ref 19–32)
CREATININE: 0.61 mg/dL (ref 0.50–1.10)
Chloride: 109 mmol/L (ref 96–112)
GFR calc Af Amer: >90 mL/min (ref >90)
GFR calc non Af Amer: >90 mL/min (ref >90)
GLUCOSE: 144 mg/dL — AB (ref 70–99)
Potassium: 4 mmol/L (ref 3.5–5.1)
Sodium: 140 mmol/L (ref 135–145)
Total Bilirubin: 0.6 mg/dL (ref 0.3–1.2)
Total Protein: 7 g/dL (ref 6.0–8.3)

## 2014-04-21 LAB — URINE MICROSCOPIC-ADD ON

## 2014-04-21 LAB — LIPASE, BLOOD: LIPASE: 29 U/L (ref 11–59)

## 2014-04-21 LAB — ETHANOL: Alcohol, Ethyl (B): <5 mg/dL (ref 0–9)

## 2014-04-21 LAB — RAPID URINE DRUG SCREEN, HOSP PERFORMED
Amphetamines: NOT DETECTED
Barbiturates: NOT DETECTED
Benzodiazepines: NOT DETECTED
Cocaine: NOT DETECTED
OPIATES: POSITIVE — AB
Tetrahydrocannabinol: NOT DETECTED

## 2014-04-21 LAB — ACETAMINOPHEN LEVEL: Acetaminophen (Tylenol), Serum: <10 ug/mL — ABNORMAL LOW (ref 10–30)

## 2014-04-21 LAB — SALICYLATE LEVEL: Salicylate Lvl: <4 mg/dL (ref 2.8–20.0)

## 2014-04-21 LAB — POC URINE PREG, ED: PREG TEST UR: NEGATIVE

## 2014-04-21 MED ORDER — ONDANSETRON HCL 4 MG/2ML IJ SOLN
4.0000 mg | Freq: Once | INTRAMUSCULAR | Status: AC
  Administered 2014-04-21: 4 mg via INTRAVENOUS
  Filled 2014-04-21: qty 2

## 2014-04-21 MED ORDER — SODIUM CHLORIDE 0.9 % IV BOLUS (SEPSIS)
1000.0000 mL | Freq: Once | INTRAVENOUS | Status: AC
  Administered 2014-04-21: 1000 mL via INTRAVENOUS

## 2014-04-21 MED ORDER — DICYCLOMINE HCL 20 MG PO TABS: 20.0000 mg | ORAL_TABLET | Freq: Two times a day (BID) | ORAL | Status: DC

## 2014-04-21 MED ORDER — MORPHINE SULFATE 4 MG/ML IJ SOLN
4.0000 mg | Freq: Once | INTRAMUSCULAR | Status: AC
  Administered 2014-04-21: 4 mg via INTRAVENOUS
  Filled 2014-04-21: qty 1

## 2014-04-21 MED ORDER — ONDANSETRON HCL 4 MG/2ML IJ SOLN: 4.0000 mg | Freq: Once | INTRAMUSCULAR | Status: DC

## 2014-04-21 MED ORDER — POLYETHYLENE GLYCOL 3350 17 GM/SCOOP PO POWD: ORAL | Status: DC

## 2014-04-21 MED ORDER — FENTANYL CITRATE 0.05 MG/ML IJ SOLN
50.0000 ug | Freq: Once | INTRAMUSCULAR | Status: AC
  Administered 2014-04-21: 50 ug via INTRAVENOUS
  Filled 2014-04-21: qty 2

## 2014-04-21 NOTE — ED Provider Notes (Signed)
CSN: [id number]     Arrival date & time 04/21/14  1027 History   First MD Initiated Contact with Patient 04/21/14 1143     Chief Complaint  Patient presents with  . Abdominal Pain     (Consider location/radiation/quality/duration/timing/severity/associated sxs/prior Treatment) HPI  This is a 49 year old female who presents emergency Department with chief complaint of abdominal pain. Patient states that her pain began suddenly at work today. She describes it as right upper quadrant and epigastric. She states it radiates to her back and up both sides to her shoulder blades. She denies any ripping or tearing sensations. The patient has had associated nausea without vomiting, diarrhea. She has not taken anything for relief of her pain. She describes the pain pain as constant, intermittently sharp. She has a long-standing history of constipation. Last bowel movement was yesterday but was very small. The patient denies a history of hypertension, hyperlipidemia, smoking, family history of early MI. She denies any known arterial disease. She denies any connective tissue disorders. The patient is unsure of her last menstrual period as she was on Depakote and then next point on. She is not currently on any birth control   Past Medical History  Diagnosis Date  . Depression   . Gall stones   . Chronic headaches   . Anxiety    Past Surgical History  Procedure Laterality Date  . Dilation and curettage of uterus    . Tonsillectomy    . Ercp  06/08/2011    Procedure: ENDOSCOPIC RETROGRADE CHOLANGIOPANCREATOGRAPHY (ERCP);  Surgeon: Theda Belfast, MD;  Location: [facility] END[facility]COPY;  Service: Endoscopy;  Laterality: N/A;  . Cholecystectomy  06/09/2011    Procedure: LAPAROSCOPIC CHOLECYSTECTOMY;  Surgeon: Liz Malady, MD;  Location: [facility] OR;  Service: General;  Laterality: N/A;   Family History  Problem Relation Age of Onset  . Diabetes Mother   . Heart disease Mother     AMI  . Heart failure Father   .  Heart disease Father     CHF with defibrillator.  . Hypertension Sister    History  Substance Use Topics  . Smoking status: Never Smoker   . Smokeless tobacco: Never Used  . Alcohol Use: Yes     Comment: occasionally   OB History    No data available     Review of Systems   Ten systems reviewed and are negative for acute change, except as noted in the HPI.   Allergies  Oxycodone-acetaminophen  Home Medications   Prior to Admission medications   Medication Sig Start Date End Date Taking? Authorizing Provider  meloxicam (MOBIC) 15 MG tablet Take 1 tablet (15 mg total) by mouth daily. 03/31/14   Richard Sikora, DPM   BP 105/65 mmHg  Pulse 82  Temp(Src) 98.1 F (36.7 C) (Oral)  Resp 16  SpO2 99% Physical Exam  Constitutional: She is oriented to person, place, and time. She appears well-developed and well-nourished. No distress.  HENT:  Head: Normocephalic and atraumatic.  Eyes: Conjunctivae are normal. No scleral icterus.  Neck: Normal range of motion.  Cardiovascular: Normal rate, regular rhythm and normal heart sounds.  Exam reveals no gallop and no friction rub.   No murmur heard. Pulmonary/Chest: Effort normal and breath sounds normal. No respiratory distress.  Abdominal: Soft. Bowel sounds are normal. She exhibits no distension and no mass. There is tenderness. There is no guarding.  Bilateral upper quadrant and epigastric pain. No CVA tenderness.  Neurological: She is alert and oriented to  person, place, and time.  Skin: Skin is warm and dry. She is not diaphoretic.  Nursing note and vitals reviewed.   ED Course  Procedures (including critical care time) Labs Review Labs Reviewed  ACETAMINOPHEN LEVEL - Abnormal; Notable for the following:    Acetaminophen (Tylenol), Serum <10.0 (*)    All other components within normal limits  COMPREHENSIVE METABOLIC PANEL - Abnormal; Notable for the following:    Glucose, Bld 144 (*)    All other components within normal  limits  URINE RAPID DRUG SCREEN (HOSP PERFORMED) - Abnormal; Notable for the following:    Opiates POSITIVE (*)    All other components within normal limits  CBC  ETHANOL  SALICYLATE LEVEL    Imaging Review No results found.   EKG Interpretation None       Date: 04/21/2014  Rate: 77  Rhythm: normal sinus rhythm  QRS Axis: normal  Intervals: QT prolonged  ST/T Wave abnormalities: normal  Conduction Disutrbances:none  Narrative Interpretation:   Old EKG Reviewed:     MDM   Final diagnoses:  Abdominal pain  Slow transit constipation    Patient here with complaint of abdominal pain. Labs pending.    Patient work up negative Large stool burden on the plain films. D/c with laxatives Patient is nontoxic, nonseptic appearing, in no apparent distress.  Patient's pain and other symptoms adequately managed in emergency department.  Fluid bolus given.  Labs, imaging and vitals reviewed.  Patient does not meet the SIRS or Sepsis criteria.  On repeat exam patient does not have a surgical abdomin and there are no peritoneal signs.  No indication of appendicitis, bowel obstruction, bowel perforation, cholecystitis, diverticulitis, PID or ectopic pregnancy.  Patient discharged home with symptomatic treatment and given strict instructions for follow-up with their primary care physician.  I have also discussed reasons to return immediately to the ER.  Patient expresses understanding and agrees with plan.     Arthor Captainbigail Jaimey Franchini, PA-C 04/29/14 1342  Benny LennertJoseph L Zammit, MD 04/30/14 [phone]

## 2014-04-21 NOTE — ED Notes (Signed)
Pt reports right sided abdominal pain starting 1 hour ago. Pain 10/10. Reports nausea, denies vomiting.

## 2014-04-21 NOTE — Discharge Instructions (Signed)
Your work up was negative for acute abnormality. Your Xray showed a large stool burden and your symptoms are likely from your constipation.  Abdominal (belly) pain can be caused by many things. Your caregiver performed an examination and possibly ordered blood/urine tests and imaging (CT scan, x-rays, ultrasound). Many cases can be observed and treated at home after initial evaluation in the emergency department. Even though you are being discharged home, abdominal pain can be unpredictable. Therefore, you need a repeated exam if your pain does not resolve, returns, or worsens. Most patients with abdominal pain don't have to be admitted to the hospital or have surgery, but serious problems like appendicitis and gallbladder attacks can start out as nonspecific pain. Many abdominal conditions cannot be diagnosed in one visit, so follow-up evaluations are very important. SEEK IMMEDIATE MEDICAL ATTENTION IF: The pain does not go away or becomes severe.  A temperature above 101 develops.  Repeated vomiting occurs (multiple episodes).  The pain becomes localized to portions of the abdomen. The right side could possibly be appendicitis. In an adult, the left lower portion of the abdomen could be colitis or diverticulitis.  Blood is being passed in stools or vomit (bright red or black tarry stools).  Return also if you develop chest pain, difficulty breathing, dizziness or fainting, or become confused, poorly responsive, or inconsolable (young children).    Abdominal Pain Many things can cause abdominal pain. Usually, abdominal pain is not caused by a disease and will improve without treatment. It can often be observed and treated at home. Your health care provider will do a physical exam and possibly order blood tests and X-rays to help determine the seriousness of your pain. However, in many cases, more time must pass before a clear cause of the pain can be found. Before that point, your health care  provider may not know if you need more testing or further treatment. HOME CARE INSTRUCTIONS  Monitor your abdominal pain for any changes. The following actions may help to alleviate any discomfort you are experiencing:  Only take over-the-counter or prescription medicines as directed by your health care provider.  Do not take laxatives unless directed to do so by your health care provider.  Try a clear liquid diet (broth, tea, or water) as directed by your health care provider. Slowly move to a bland diet as tolerated. SEEK MEDICAL CARE IF:  You have unexplained abdominal pain.  You have abdominal pain associated with nausea or diarrhea.  You have pain when you urinate or have a bowel movement.  You experience abdominal pain that wakes you in the night.  You have abdominal pain that is worsened or improved by eating food.  You have abdominal pain that is worsened with eating fatty foods.  You have a fever. SEEK IMMEDIATE MEDICAL CARE IF:   Your pain does not go away within 2 hours.  You keep throwing up (vomiting).  Your pain is felt only in portions of the abdomen, such as the right side or the left lower portion of the abdomen.  You pass bloody or black tarry stools. MAKE SURE YOU:  Understand these instructions.   Will watch your condition.   Will get help right away if you are not doing well or get worse.  Document Released: 11/23/2004 Document Revised: 02/18/2013 Document Reviewed: 10/23/2012 [facility] Patient Information 2015 [place], [place]. This information is not intended to replace advice given to you by your health care provider. Make sure you discuss any questions you have  with your health care provider.  Constipation Constipation is when a person has fewer than three bowel movements a week, has difficulty having a bowel movement, or has stools that are dry, hard, or larger than normal. As people grow older, constipation is more common. If you try to fix  constipation with medicines that make you have a bowel movement (laxatives), the problem may get worse. Long-term laxative use may cause the muscles of the colon to become weak. A low-fiber diet, not taking in enough fluids, and taking certain medicines may make constipation worse.  CAUSES   Certain medicines, such as antidepressants, pain medicine, iron supplements, antacids, and water pills.   Certain diseases, such as diabetes, irritable bowel syndrome (IBS), thyroid disease, or depression.   Not drinking enough water.   Not eating enough fiber-rich foods.   Stress or travel.   Lack of physical activity or exercise.   Ignoring the urge to have a bowel movement.   Using laxatives too much.  SIGNS AND SYMPTOMS   Having fewer than three bowel movements a week.   Straining to have a bowel movement.   Having stools that are hard, dry, or larger than normal.   Feeling full or bloated.   Pain in the lower abdomen.   Not feeling relief after having a bowel movement.  DIAGNOSIS  Your health care provider will take a medical history and perform a physical exam. Further testing may be done for severe constipation. Some tests may include:  A barium enema X-ray to examine your rectum, colon, and, sometimes, your small intestine.   A sigmoidoscopy to examine your lower colon.   A colonoscopy to examine your entire colon. TREATMENT  Treatment will depend on the severity of your constipation and what is causing it. Some dietary treatments include drinking more fluids and eating more fiber-rich foods. Lifestyle treatments may include regular exercise. If these diet and lifestyle recommendations do not help, your health care provider may recommend taking over-the-counter laxative medicines to help you have bowel movements. Prescription medicines may be prescribed if over-the-counter medicines do not work.  HOME CARE INSTRUCTIONS   Eat foods that have a lot of fiber, such  as fruits, vegetables, whole grains, and beans.  Limit foods high in fat and processed sugars, such as french fries, hamburgers, cookies, candies, and soda.   A fiber supplement may be added to your diet if you cannot get enough fiber from foods.   Drink enough fluids to keep your urine clear or pale yellow.   Exercise regularly or as directed by your health care provider.   Go to the restroom when you have the urge to go. Do not hold it.   Only take over-the-counter or prescription medicines as directed by your health care provider. Do not take other medicines for constipation without talking to your health care provider first.  SEEK IMMEDIATE MEDICAL CARE IF:   You have bright red blood in your stool.   Your constipation lasts for more than 4 days or gets worse.   You have abdominal or rectal pain.   You have thin, pencil-like stools.   You have unexplained weight loss. MAKE SURE YOU:   Understand these instructions.  Will watch your condition.  Will get help right away if you are not doing well or get worse. Document Released: 11/12/2003 Document Revised: 02/18/2013 Document Reviewed: 11/25/2012 [facility] Patient Information 2015 [place], [place]. This information is not intended to replace advice given to you by your health care  provider. Make sure you discuss any questions you have with your health care provider.

## 2014-05-14 ENCOUNTER — Ambulatory Visit (INDEPENDENT_AMBULATORY_CARE_PROVIDER_SITE_OTHER): Payer: BLUE CROSS/BLUE SHIELD

## 2014-05-14 ENCOUNTER — Ambulatory Visit (INDEPENDENT_AMBULATORY_CARE_PROVIDER_SITE_OTHER): Payer: BLUE CROSS/BLUE SHIELD | Admitting: Physician Assistant

## 2014-05-14 VITALS — BP 112/80 | HR 84 | Temp 98.6°F | Resp 16 | Ht 63.0 in | Wt 168.2 lb

## 2014-05-14 DIAGNOSIS — M79672 Pain in left foot: Secondary | ICD-10-CM | POA: Diagnosis not present

## 2014-05-14 DIAGNOSIS — M25572 Pain in left ankle and joints of left foot: Secondary | ICD-10-CM

## 2014-05-14 NOTE — Patient Instructions (Signed)
Your xrays of the left foot, ankle, and lower leg look normal. Your symptoms may be from plantar fascitis with pain radiating slightly up the leg.  Please start taking the mobic again. Once daily. This will help with pain and inflammation. Purchasing a heel support to wear during the day may help. Doing the exercises below may help.  Avoiding going barefoot or wearing flip flops may help.  If all these measures do not seem to resolve the pain please let us know. We may refer you to a specialist at that time.   Plantar Fasciitis (Heel Spur Syndrome) with Rehab The plantar fascia is a fibrous, ligament-like, soft-tissue structure that spans the bottom of the foot. Plantar fasciitis is a condition that causes pain in the foot due to inflammation of the tissue. SYMPTOMS   Pain and tenderness on the underneath side of the foot.  Pain that worsens with standing or walking. CAUSES  Plantar fasciitis is caused by irritation and injury to the plantar fascia on the underneath side of the foot. Common mechanisms of injury include:  Direct trauma to bottom of the foot.  Damage to a small nerve that runs under the foot where the main fascia attaches to the heel bone.  Stress placed on the plantar fascia due to bone spurs. RISK INCREASES WITH:   Activities that place stress on the plantar fascia (running, jumping, pivoting, or cutting).  Poor strength and flexibility.  Improperly fitted shoes.  Tight calf muscles.  Flat feet.  Failure to warm-up properly before activity.  Obesity. PREVENTION  Warm up and stretch properly before activity.  Allow for adequate recovery between workouts.  Maintain physical fitness:  Strength, flexibility, and endurance.  Cardiovascular fitness.  Maintain a health body weight.  Avoid stress on the plantar fascia.  Wear properly fitted shoes, including arch supports for individuals who have flat feet. PROGNOSIS  If treated properly, then the  symptoms of plantar fasciitis usually resolve without surgery. However, occasionally surgery is necessary. RELATED COMPLICATIONS   Recurrent symptoms that may result in a chronic condition.  Problems of the lower back that are caused by compensating for the injury, such as limping.  Pain or weakness of the foot during push-off following surgery.  Chronic inflammation, scarring, and partial or complete fascia tear, occurring more often from repeated injections. TREATMENT  Treatment initially involves the use of ice and medication to help reduce pain and inflammation. The use of strengthening and stretching exercises may help reduce pain with activity, especially stretches of the Achilles tendon. These exercises may be performed at home or with a therapist. Your caregiver may recommend that you use heel cups of arch supports to help reduce stress on the plantar fascia. Occasionally, corticosteroid injections are given to reduce inflammation. If symptoms persist for greater than 6 months despite non-surgical (conservative), then surgery may be recommended.  MEDICATION   If pain medication is necessary, then nonsteroidal anti-inflammatory medications, such as aspirin and ibuprofen, or other minor pain relievers, such as acetaminophen, are often recommended.  Do not take pain medication within 7 days before surgery.  Prescription pain relievers may be given if deemed necessary by your caregiver. Use only as directed and only as much as you need.  Corticosteroid injections may be given by your caregiver. These injections should be reserved for the most serious cases, because they may only be given a certain number of times. HEAT AND COLD  Cold treatment (icing) relieves pain and reduces inflammation. Cold treatment should be  applied for 10 to 15 minutes every 2 to 3 hours for inflammation and pain and immediately after any activity that aggravates your symptoms. Use ice packs or massage the area  with a piece of ice (ice massage).  Heat treatment may be used prior to performing the stretching and strengthening activities prescribed by your caregiver, physical therapist, or athletic trainer. Use a heat pack or soak the injury in warm water. SEEK IMMEDIATE MEDICAL CARE IF:  Treatment seems to offer no benefit, or the condition worsens.  Any medications produce adverse side effects. EXERCISES RANGE OF MOTION (ROM) AND STRETCHING EXERCISES - Plantar Fasciitis (Heel Spur Syndrome) These exercises may help you when beginning to rehabilitate your injury. Your symptoms may resolve with or without further involvement from your physician, physical therapist or athletic trainer. While completing these exercises, remember:   Restoring tissue flexibility helps normal motion to return to the joints. This allows healthier, less painful movement and activity.  An effective stretch should be held for at least 30 seconds.  A stretch should never be painful. You should only feel a gentle lengthening or release in the stretched tissue. RANGE OF MOTION - Toe Extension, Flexion  Sit with your right / left leg crossed over your opposite knee.  Grasp your toes and gently pull them back toward the top of your foot. You should feel a stretch on the bottom of your toes and/or foot.  Hold this stretch for __________ seconds.  Now, gently pull your toes toward the bottom of your foot. You should feel a stretch on the top of your toes and or foot.  Hold this stretch for __________ seconds. Repeat __________ times. Complete this stretch __________ times per day.  RANGE OF MOTION - Ankle Dorsiflexion, Active Assisted  Remove shoes and sit on a chair that is preferably not on a carpeted surface.  Place right / left foot under knee. Extend your opposite leg for support.  Keeping your heel down, slide your right / left foot back toward the chair until you feel a stretch at your ankle or calf. If you do not  feel a stretch, slide your bottom forward to the edge of the chair, while still keeping your heel down.  Hold this stretch for __________ seconds. Repeat __________ times. Complete this stretch __________ times per day.  STRETCH - Gastroc, Standing  Place hands on wall.  Extend right / left leg, keeping the front knee somewhat bent.  Slightly point your toes inward on your back foot.  Keeping your right / left heel on the floor and your knee straight, shift your weight toward the wall, not allowing your back to arch.  You should feel a gentle stretch in the right / left calf. Hold this position for __________ seconds. Repeat __________ times. Complete this stretch __________ times per day. STRETCH - Soleus, Standing  Place hands on wall.  Extend right / left leg, keeping the other knee somewhat bent.  Slightly point your toes inward on your back foot.  Keep your right / left heel on the floor, bend your back knee, and slightly shift your weight over the back leg so that you feel a gentle stretch deep in your back calf.  Hold this position for __________ seconds. Repeat __________ times. Complete this stretch __________ times per day. STRETCH - Gastrocsoleus, Standing  Note: This exercise can place a lot of stress on your foot and ankle. Please complete this exercise only if specifically instructed by your caregiver.  Place the ball of your right / left foot on a step, keeping your other foot firmly on the same step.  Hold on to the wall or a rail for balance.  Slowly lift your other foot, allowing your body weight to press your heel down over the edge of the step.  You should feel a stretch in your right / left calf.  Hold this position for __________ seconds.  Repeat this exercise with a slight bend in your right / left knee. Repeat __________ times. Complete this stretch __________ times per day.  STRENGTHENING EXERCISES - Plantar Fasciitis (Heel Spur Syndrome)  These  exercises may help you when beginning to rehabilitate your injury. They may resolve your symptoms with or without further involvement from your physician, physical therapist or athletic trainer. While completing these exercises, remember:   Muscles can gain both the endurance and the strength needed for everyday activities through controlled exercises.  Complete these exercises as instructed by your physician, physical therapist or athletic trainer. Progress the resistance and repetitions only as guided. STRENGTH - Towel Curls  Sit in a chair positioned on a non-carpeted surface.  Place your foot on a towel, keeping your heel on the floor.  Pull the towel toward your heel by only curling your toes. Keep your heel on the floor.  If instructed by your physician, physical therapist or athletic trainer, add ____________________ at the end of the towel. Repeat __________ times. Complete this exercise __________ times per day. STRENGTH - Ankle Inversion  Secure one end of a rubber exercise band/tubing to a fixed object (table, pole). Loop the other end around your foot just before your toes.  Place your fists between your knees. This will focus your strengthening at your ankle.  Slowly, pull your big toe up and in, making sure the band/tubing is positioned to resist the entire motion.  Hold this position for __________ seconds.  Have your muscles resist the band/tubing as it slowly pulls your foot back to the starting position. Repeat __________ times. Complete this exercises __________ times per day.  Document Released: 02/13/2005 Document Revised: 05/08/2011 Document Reviewed: 05/28/2008 [facility] Patient Information 2015 [place], [place]. This information is not intended to replace advice given to you by your health care provider. Make sure you discuss any questions you have with your health care provider.

## 2014-05-14 NOTE — Progress Notes (Signed)
Subjective:    Patient ID: [name], female    DOB: Mar 15, 1965, 49 y.o.   MRN: [id number]  Chief Complaint  Patient presents with  . Leg Pain    left; today   . Foot Pain    left; today    Patient Active Problem List   Diagnosis Date Noted  . S/P laparoscopic cholecystectomy 07/05/2011  . BREAST PAIN 05/19/2009  . CERVICAL STRAIN 05/19/2009  . ALLERGIC RHINITIS 06/22/2008  . HEADACHE 06/22/2008  . DEPRESSION, MAJOR, MODERATE 06/15/2008  . PARESTHESIA 06/15/2008  . PULMONARY NODULE 04/17/2007  . COUGH 04/17/2007  . CHEST PAIN-UNSPECIFIED 04/17/2007  . MENORRHAGIA 07/02/2006  . NOCTURIA 04/26/2006   Prior to Admission medications   Medication Sig Start Date End Date Taking? Authorizing Provider  FIBER SELECT GUMMIES CHEW Chew 3 capsules by mouth daily.   Yes Historical Provider, MD  polyethylene glycol powder (GLYCOLAX/MIRALAX) powder Take 5 capfuls of Miralax in 32 ounces of fluids. 04/21/14  Yes Arthor Captain, PA-C  meloxicam (MOBIC) 15 MG tablet Take 1 tablet (15 mg total) by mouth daily. Patient not taking: Reported on 05/14/2014 03/31/14   Alvan Dame, DPM   Medications, allergies, past medical history, surgical history, family history, social history and problem list reviewed and updated.  HPI  66 yof with pmh chronic back pain presents with left foot/leg pain starting today.   Sx started around 1pm. Fairly sudden onset pain across plantar surface left foot and up both sides of left leg to just below knee. No radiation of pain. Has been constant in these areas since onset. 6/10. Worsens with walking, standing. Pain in bottom of feet feels like stabbing sensation. Has not taken anything for pain. First time she has had pain like this. No trauma.   Works 2 jobs and on her feet a lot.    Saw podiatrist yrs ago and was told had plantar fasciitis. Has never followed up on this.   Review of Systems No fever, chills, cp, sob, dysuria, abd pain.     Objective:   Physical Exam  Constitutional: She is oriented to person, place, and time. She appears well-developed and well-nourished.  Non-toxic appearance. She does not have a sickly appearance. She does not appear ill. No distress.  BP 112/80 mmHg  Pulse 84  Temp(Src) 98.6 F (37 C) (Oral)  Resp 16  Ht  (1.6 m)  Wt 168 lb 3.2 oz (76.295 kg)  BMI 29.80 kg/m2  SpO2 100%   Musculoskeletal:       Left knee: Normal. She exhibits normal range of motion, no swelling, no effusion and no bony tenderness. No tenderness found.       Left ankle: She exhibits normal range of motion and no swelling. Tenderness.       Left lower leg: She exhibits tenderness. She exhibits no bony tenderness and no swelling.       Legs:      Left foot: There is tenderness. There is normal range of motion, no bony tenderness, no swelling and normal capillary refill.  Left lower leg: Mild TTP over circled areas. No overlying skin changes. No edema. Neg Homans sign. Neg Thompsans test. No swelling. No posterior leg ttp.   Left ankle: Mild TTP over lateral malleolus, dorsum of foot. No laxity with inversion, eversion of ankle. No warmth, erythema. No swelling.   Left foot: Moderate ttp over plantar surface of foot. No skin changes. No swelling. Normal strength, sensation. Normal cap refill. 2+ DP,  PT pulses.   Neurological: She is alert and oriented to person, place, and time.  Psychiatric: She has a normal mood and affect. Her speech is normal.   [facility] reading (PRIMARY) by  Dr. Katrinka BlazingSmith. Left ankle complete findings: Normal Left foot 2 view findings: Normal     Assessment & Plan:   [age] yof with pmh chronic back pain presents with left foot/leg pain starting today.   Left foot pain - Plan: DG Foot 2 Views Left Left ankle pain - Plan: DG Ankle Complete Left --xrays normal --sx most likely from plantar fascitis with possible muscular strain lower leg from standing/walking constantly with 2 jobs --restart mobic, heel support,  exercises/stretches given, avoid barefoot/flip fops which she wears a lot --if no relief with these measures ortho or podiatry --rtc or er if pain worsens  Donnajean Lopesodd M. Starnisha Batrez, PA-C Physician Assistant-Certified [facility]  05/15/2014 9:14 PM

## 2014-07-16 ENCOUNTER — Encounter: Payer: Self-pay | Admitting: Family Medicine

## 2014-07-16 ENCOUNTER — Ambulatory Visit (INDEPENDENT_AMBULATORY_CARE_PROVIDER_SITE_OTHER): Payer: BLUE CROSS/BLUE SHIELD

## 2014-07-16 ENCOUNTER — Ambulatory Visit (INDEPENDENT_AMBULATORY_CARE_PROVIDER_SITE_OTHER): Payer: BLUE CROSS/BLUE SHIELD | Admitting: Family Medicine

## 2014-07-16 VITALS — BP 108/80 | HR 94 | Temp 99.3°F | Resp 16 | Ht 63.0 in | Wt 170.0 lb

## 2014-07-16 DIAGNOSIS — K5900 Constipation, unspecified: Secondary | ICD-10-CM

## 2014-07-16 DIAGNOSIS — F410 Panic disorder [episodic paroxysmal anxiety] without agoraphobia: Secondary | ICD-10-CM

## 2014-07-16 DIAGNOSIS — R0781 Pleurodynia: Secondary | ICD-10-CM | POA: Diagnosis not present

## 2014-07-16 DIAGNOSIS — R079 Chest pain, unspecified: Secondary | ICD-10-CM

## 2014-07-16 DIAGNOSIS — R072 Precordial pain: Secondary | ICD-10-CM | POA: Diagnosis not present

## 2014-07-16 DIAGNOSIS — Z8249 Family history of ischemic heart disease and other diseases of the circulatory system: Secondary | ICD-10-CM | POA: Diagnosis not present

## 2014-07-16 DIAGNOSIS — N911 Secondary amenorrhea: Secondary | ICD-10-CM

## 2014-07-16 DIAGNOSIS — G43809 Other migraine, not intractable, without status migrainosus: Secondary | ICD-10-CM | POA: Diagnosis not present

## 2014-07-16 DIAGNOSIS — F39 Unspecified mood [affective] disorder: Secondary | ICD-10-CM

## 2014-07-16 DIAGNOSIS — K5909 Other constipation: Secondary | ICD-10-CM

## 2014-07-16 LAB — LIPASE: LIPASE: 35 U/L (ref 0–75)

## 2014-07-16 LAB — COMPREHENSIVE METABOLIC PANEL
ALT: 19 U/L (ref 0–35)
AST: 16 U/L (ref 0–37)
Albumin: 4.2 g/dL (ref 3.5–5.2)
Alkaline Phosphatase: 65 U/L (ref 39–117)
BILIRUBIN TOTAL: 0.4 mg/dL (ref 0.2–1.2)
BUN: 10 mg/dL (ref 6–23)
CALCIUM: 10.2 mg/dL (ref 8.4–10.5)
CHLORIDE: 107 meq/L (ref 96–112)
CO2: 26 meq/L (ref 19–32)
CREATININE: 0.66 mg/dL (ref 0.50–1.10)
Glucose, Bld: 82 mg/dL (ref 70–99)
Potassium: 4.2 mEq/L (ref 3.5–5.3)
SODIUM: 141 meq/L (ref 135–145)
Total Protein: 7.1 g/dL (ref 6.0–8.3)

## 2014-07-16 LAB — POCT CBC
GRANULOCYTE PERCENT: 67.8 % (ref 37–80)
HCT, POC: 43.7 % (ref 37.7–47.9)
Hemoglobin: 14.2 g/dL (ref 12.2–16.2)
Lymph, poc: 2.1 (ref 0.6–3.4)
MCH, POC: 30.3 pg (ref 27–31.2)
MCHC: 32.5 g/dL (ref 31.8–35.4)
MCV: 93.1 fL (ref 80–97)
MID (cbc): 0.5 (ref 0–0.9)
MPV: 7 fL (ref 0–99.8)
POC GRANULOCYTE: 5.6 (ref 2–6.9)
POC LYMPH PERCENT: 25.9 %L (ref 10–50)
POC MID %: 6.3 %M (ref 0–12)
Platelet Count, POC: 241 10*3/uL (ref 142–424)
RBC: 4.69 M/uL (ref 4.04–5.48)
RDW, POC: 13.5 %
WBC: 8.3 10*3/uL (ref 4.6–10.2)

## 2014-07-16 LAB — POCT SEDIMENTATION RATE: POCT SED RATE: 17 mm/h (ref 0–22)

## 2014-07-16 LAB — POCT URINE PREGNANCY: PREG TEST UR: NEGATIVE

## 2014-07-16 LAB — POCT GLYCOSYLATED HEMOGLOBIN (HGB A1C): Hemoglobin A1C: 5.2

## 2014-07-16 MED ORDER — ALPRAZOLAM 0.25 MG PO TABS
0.5000 mg | ORAL_TABLET | Freq: Once | ORAL | Status: AC
  Administered 2014-07-16: 0.5 mg via ORAL

## 2014-07-16 MED ORDER — GI COCKTAIL ~~LOC~~
30.0000 mL | Freq: Once | ORAL | Status: AC
  Administered 2014-07-16: 30 mL via ORAL

## 2014-07-16 MED ORDER — RANITIDINE HCL 150 MG PO TABS: 150.0000 mg | ORAL_TABLET | Freq: Two times a day (BID) | ORAL | Status: DC

## 2014-07-16 MED ORDER — MELOXICAM 7.5 MG PO TABS: 7.5000 mg | ORAL_TABLET | Freq: Every day | ORAL | Status: DC

## 2014-07-16 MED ORDER — POLYETHYLENE GLYCOL 3350 17 GM/SCOOP PO POWD: ORAL | Status: DC

## 2014-07-16 MED ORDER — LORAZEPAM 0.5 MG PO TABS: 0.5000 mg | ORAL_TABLET | Freq: Two times a day (BID) | ORAL | Status: DC | PRN

## 2014-07-16 MED ORDER — SERTRALINE HCL 25 MG PO TABS: 25.0000 mg | ORAL_TABLET | Freq: Every day | ORAL | Status: DC

## 2014-07-16 NOTE — Patient Instructions (Addendum)
Do not use the meloxicam with any other otc pain medication other than tylenol/acetaminophen - so no aleve, ibuprofen, motrin, advil, etc.   Using any pain medicine daily will make your migraine headache worse. [facility]  [address], [place], [place] [address]  [phone]  [facility]  Address: [address], [place], [place] [address]  [phone]  [facility] - Dr. Allena Katz Address: [address], [place], [place] [address]  [phone]  [facility] [address] [place], [id number] Phone: [phone]  [facility] has a new location in [place].  I recommend doing a miralax clean-out. Put 14 (not kidding) doses of miralax (polyethylene glycol) into 64 oz of any clear, non-carbonated liquid (apple juice, gatorade, water) and drink this WITHIN 24 HOURS!!!! For the next 2d, plan to stay home and just hang around the toilet as you will hopefully be having 8 BM/day of LIQUID stool.  If the diarrhea occurs soon after starting process without passing a significant stool volume or if you are having any fecal incontinence you should keep going as this may be the initial miralax washing around the larger stools.  As soon as you finish the cleanout, start twice a day miralax. In addition to make sure your constipation is fully treated you can augment with colace or magnesium citrate which are available over the counter.  [facility] Policy for Prescribing Controlled Substances (Revised 12/2011) 1. Prescriptions for controlled substances will be filled by ONE provider at [facility] with whom you have established and developed a plan for your care, including follow-up. 2. You are encouraged to schedule an appointment with your prescriber at our appointment center for follow-up visits whenever possible. 3. If you request a prescription for the controlled  substance while at [facility] for an acute problem (with someone other than your regular prescriber), you MAY be given a ONE-TIME prescription for a 30-day supply of the controlled substance, to allow time for you to return to see your regular prescriber for additional prescriptions. Pleurisy Pleurisy is an inflammation and swelling of the lining of the lungs (pleura). Because of this inflammation, it hurts to breathe. It can be aggravated by coughing, laughing, or deep breathing. Pleurisy is often caused by an underlying infection or disease.  HOME CARE INSTRUCTIONS  Monitor your pleurisy for any changes. The following actions may help to alleviate any discomfort you are experiencing:  Medicine may help with pain. Only take over-the-counter or prescription medicines for pain, discomfort, or fever as directed by your health care provider.  Only take antibiotic medicine as directed. Make sure to finish it even if you start to feel better. SEEK MEDICAL CARE IF:   Your pain is not controlled with medicine or is increasing.  You have an increase in pus-like (purulent) secretions brought up with coughing. SEEK IMMEDIATE MEDICAL CARE IF:   You have blue or dark lips, fingernails, or toenails.  You are coughing up blood.  You have increased difficulty breathing.  You have continuing pain unrelieved by medicine or pain lasting more than 1 week.  You have pain that radiates into your neck, arms, or jaw.  You develop increased shortness of breath or wheezing.  You develop a fever, rash, vomiting, fainting, or other serious symptoms. MAKE SURE YOU:  Understand these instructions.   Will watch your condition.   Will get help right away if you are not doing well or get worse.  Document  Released: 02/13/2005 Document Revised: 10/16/2012 Document Reviewed: 07/28/2012 [facility] Patient Information 2015 [place], [place]. This information is not intended to replace advice given to you by your health  care provider. Make sure you discuss any questions you have with your health care provider.   Migraine Headache A migraine headache is an intense, throbbing pain on one or both sides of your head. A migraine can last for 30 minutes to several hours. CAUSES  The exact cause of a migraine headache is not always known. However, a migraine may be caused when nerves in the brain become irritated and release chemicals that cause inflammation. This causes pain. Certain things may also trigger migraines, such as:  Alcohol.  Smoking.  Stress.  Menstruation.  Aged cheeses.  Foods or drinks that contain nitrates, glutamate, aspartame, or tyramine.  Lack of sleep.  Chocolate.  Caffeine.  Hunger.  Physical exertion.  Fatigue.  Medicines used to treat chest pain (nitroglycerine), birth control pills, estrogen, and some blood pressure medicines. SIGNS AND SYMPTOMS  Pain on one or both sides of your head.  Pulsating or throbbing pain.  Severe pain that prevents daily activities.  Pain that is aggravated by any physical activity.  Nausea, vomiting, or both.  Dizziness.  Pain with exposure to bright lights, loud noises, or activity.  General sensitivity to bright lights, loud noises, or smells. Before you get a migraine, you may get warning signs that a migraine is coming (aura). An aura may include:  Seeing flashing lights.  Seeing bright spots, halos, or zigzag lines.  Having tunnel vision or blurred vision.  Having feelings of numbness or tingling.  Having trouble talking.  Having muscle weakness. DIAGNOSIS  A migraine headache is often diagnosed based on:  Symptoms.  Physical exam.  A CT scan or MRI of your head. These imaging tests cannot diagnose migraines, but they can help rule out other causes of headaches. TREATMENT Medicines may be given for pain and nausea. Medicines can also be given to help prevent recurrent migraines.  HOME CARE  INSTRUCTIONS  Only take over-the-counter or prescription medicines for pain or discomfort as directed by your health care provider. The use of long-term narcotics is not recommended.  Lie down in a dark, quiet room when you have a migraine.  Keep a journal to find out what may trigger your migraine headaches. For example, write down:  What you eat and drink.  How much sleep you get.  Any change to your diet or medicines.  Limit alcohol consumption.  Quit smoking if you smoke.  Get 7-9 hours of sleep, or as recommended by your health care provider.  Limit stress.  Keep lights dim if bright lights bother you and make your migraines worse. SEEK IMMEDIATE MEDICAL CARE IF:   Your migraine becomes severe.  You have a fever.  You have a stiff neck.  You have vision loss.  You have muscular weakness or loss of muscle control.  You start losing your balance or have trouble walking.  You feel faint or pass out.  You have severe symptoms that are different from your first symptoms. MAKE SURE YOU:   Understand these instructions.  Will watch your condition.  Will get help right away if you are not doing well or get worse. Document Released: 02/13/2005 Document Revised: 06/30/2013 Document Reviewed: 10/21/2012 [facility] Patient Information 2015 [place], [place]. This information is not intended to replace advice given to you by your health care provider. Make sure you discuss any questions you  have with your health care provider.   Constipation Constipation is when a person has fewer than three bowel movements a week, has difficulty having a bowel movement, or has stools that are dry, hard, or larger than normal. As people grow older, constipation is more common. If you try to fix constipation with medicines that make you have a bowel movement (laxatives), the problem may get worse. Long-term laxative use may cause the muscles of the colon to become weak. A low-fiber diet, not  taking in enough fluids, and taking certain medicines may make constipation worse.  CAUSES   Certain medicines, such as antidepressants, pain medicine, iron supplements, antacids, and water pills.   Certain diseases, such as diabetes, irritable bowel syndrome (IBS), thyroid disease, or depression.   Not drinking enough water.   Not eating enough fiber-rich foods.   Stress or travel.   Lack of physical activity or exercise.   Ignoring the urge to have a bowel movement.   Using laxatives too much.  SIGNS AND SYMPTOMS   Having fewer than three bowel movements a week.   Straining to have a bowel movement.   Having stools that are hard, dry, or larger than normal.   Feeling full or bloated.   Pain in the lower abdomen.   Not feeling relief after having a bowel movement.  DIAGNOSIS  Your health care provider will take a medical history and perform a physical exam. Further testing may be done for severe constipation. Some tests may include:  A barium enema X-ray to examine your rectum, colon, and, sometimes, your small intestine.   A sigmoidoscopy to examine your lower colon.   A colonoscopy to examine your entire colon. TREATMENT  Treatment will depend on the severity of your constipation and what is causing it. Some dietary treatments include drinking more fluids and eating more fiber-rich foods. Lifestyle treatments may include regular exercise. If these diet and lifestyle recommendations do not help, your health care provider may recommend taking over-the-counter laxative medicines to help you have bowel movements. Prescription medicines may be prescribed if over-the-counter medicines do not work.  HOME CARE INSTRUCTIONS   Eat foods that have a lot of fiber, such as fruits, vegetables, whole grains, and beans.  Limit foods high in fat and processed sugars, such as french fries, hamburgers, cookies, candies, and soda.   A fiber supplement may be added to  your diet if you cannot get enough fiber from foods.   Drink enough fluids to keep your urine clear or pale yellow.   Exercise regularly or as directed by your health care provider.   Go to the restroom when you have the urge to go. Do not hold it.   Only take over-the-counter or prescription medicines as directed by your health care provider. Do not take other medicines for constipation without talking to your health care provider first.  SEEK IMMEDIATE MEDICAL CARE IF:   You have bright red blood in your stool.   Your constipation lasts for more than 4 days or gets worse.   You have abdominal or rectal pain.   You have thin, pencil-like stools.   You have unexplained weight loss. MAKE SURE YOU:   Understand these instructions.  Will watch your condition.  Will get help right away if you are not doing well or get worse. Document Released: 11/12/2003 Document Revised: 02/18/2013 Document Reviewed: 11/25/2012 [facility] Patient Information 2015 [place], [place]. This information is not intended to replace advice given to you by your  health care provider. Make sure you discuss any questions you have with your health care provider.   About Constipation  Constipation Overview Constipation is the most common gastrointestinal complaint - about 4 million Americans experience constipation and make 2.5 million physician visits a year to get help for the problem.  Constipation can occur when the colon absorbs too much water, the colon's muscle contraction is slow or sluggish, and/or there is delayed transit time through the colon.  The result is stool that is hard and dry.  Indicators of constipation include straining during bowel movements greater than 25% of the time, having fewer than three bowel movements per week, and/or the feeling of incomplete evacuation.  There are established guidelines ([place] II ) for defining constipation. A person needs to have two or more of the following  symptoms for at least 12 weeks (not necessarily consecutive) in the preceding 12 months: . Straining in  greater than 25% of bowel movements . Lumpy or hard stools in greater than 25% of bowel movements . Sensation of incomplete emptying in greater than 25% of bowel movements . Sensation of anorectal obstruction/blockade in greater than 25% of bowel movements . Manual maneuvers to help empty greater than 25% of bowel movements (e.g., digital evacuation, support of the pelvic floor)  . Less than  3 bowel movements/week . Loose stools are not present, and criteria for irritable bowel syndrome are insufficient  Common Causes of Constipation . Lack of fiber in your diet . Lack of physical activity . Medications, including iron and calcium supplements  . Dairy intake . Dehydration . Abuse of laxatives  Travel  Irritable Bowel Syndrome  Pregnancy  Luteal phase of menstruation (after ovulation and before menses)  Colorectal problems  Intestinal Dysfunction  Treating Constipation  There are several ways of treating constipation, including changes to diet and exercise, use of laxatives, adjustments to the pelvic floor, and scheduled toileting.  These treatments include: . increasing fiber and fluids in the diet  . increasing physical activity . learning muscle coordination   learning proper toileting techniques and toileting modifications   designing and sticking  to a toileting schedule     2007, [employer] Doc.22

## 2014-07-16 NOTE — Progress Notes (Addendum)
Subjective:    Patient ID: [name], female    DOB: 03/31/1965, 49 y.o.   MRN: [id number] Chief Complaint  Patient presents with  . Chest Pain    Since Last Night    HPI   Last night she developed substernal chest pain about 9:30 or 10 pm - she had just got home from work - and had eating a small chef salad about 30 min prior.  Pain is a dull pressure and seems to be lower chest and shooting straight through to the center of back between he shoulder blades.  No other radiation.  She is also having nasuea. No vomiting. Last night she was able to fall asleep despite the pain but the pain was still present when she up this morning.  Was able to eat a sausage biscuit this a.m. without any exacerbating or relieving.  Normally has a sweet tea each monring which she has been unable to drink. Occ gets heartburn but not a lot. Not taking any prescription medications. No alcohol. No herbals, illicit, or vitaminsa    Pt comes here for her primary medical care and has not had a full CPE or preventative health care or labs.  Both of parents her heart problems - her mother also has Dm and both of her parents smoked. Her mother passed from AMI at 47 yo. Her older sister does not have any known heart problems  Had gallbladder out 2013 without problem since with chronic constipaiton treated with milk magnesia - is a little constipated now.      Past Medical History  Diagnosis Date  . Depression   . Gall stones   . Chronic headaches   . Anxiety    Past Surgical History  Procedure Laterality Date  . Dilation and curettage of uterus    . Tonsillectomy    . Ercp  06/08/2011    Procedure: ENDOSCOPIC RETROGRADE CHOLANGIOPANCREATOGRAPHY (ERCP);  Surgeon: Theda BelfastPatrick D Hung, MD;  [facility];  Service: Endoscopy;  Laterality: N/A;  . Cholecystectomy  06/09/2011    Procedure: LAPAROSCOPIC CHOLECYSTECTOMY;  Surgeon: Liz MaladyBurke E Thompson, MD;  [facility];  Service: General;  Laterality:  N/A;   Current Outpatient Prescriptions on File Prior to Visit  Medication Sig Dispense Refill  . FIBER SELECT GUMMIES CHEW Chew 3 capsules by mouth daily.    . meloxicam (MOBIC) 15 MG tablet Take 1 tablet (15 mg total) by mouth daily. (Patient not taking: Reported on 05/14/2014) 30 tablet 1  . polyethylene glycol powder (GLYCOLAX/MIRALAX) powder Take 5 capfuls of Miralax in 32 ounces of fluids. (Patient not taking: Reported on 07/16/2014) 255 g 0   No current facility-administered medications on file prior to visit.   No Known Allergies Family History  Problem Relation Age of Onset  . Diabetes Mother   . Heart disease Mother     AMI  . Heart failure Father   . Heart disease Father     CHF with defibrillator.  . Hypertension Sister    History   Social History  . Marital Status: Single    Spouse Name: N/A  . Number of Children: N/A  . Years of Education: N/A   Social History Main Topics  . Smoking status: Never Smoker   . Smokeless tobacco: Never Used  . Alcohol Use: Yes     Comment: occasionally  . Drug Use: No  . Sexual Activity: Yes    Birth Control/ Protection: Injection   Other Topics Concern  .  None   Social History Narrative   Marital status: engaged;       Children: 3 children      Lives: with daughter, fiance      Employment: [place] at [employer] [place]      Tobacco; none      Alcohol:  Socially; weekends.      Drugs: none     Exercise: none     Review of Systems  Constitutional: Positive for appetite change. Negative for fever, chills, activity change and unexpected weight change.  Respiratory: Negative for cough, chest tightness, shortness of breath, wheezing and stridor.   Cardiovascular: Positive for chest pain. Negative for palpitations and leg swelling.  Gastrointestinal: Positive for nausea, abdominal pain and constipation. Negative for vomiting, diarrhea, blood in stool and abdominal distention.  Genitourinary: Positive for menstrual problem.  Negative for dysuria, decreased urine volume and vaginal bleeding.  Musculoskeletal: Positive for myalgias, back pain and arthralgias. Negative for joint swelling, gait problem, neck pain and neck stiffness.  Skin: Negative for rash.  Neurological: Positive for headaches.  Psychiatric/Behavioral: Positive for sleep disturbance. The patient is nervous/anxious.        Objective:  BP 108/80 mmHg  Pulse 94  Temp(Src) 99.3 F (37.4 C) (Oral)  Resp 16  Ht 5\' 3"  (1.6 m)  Wt 170 lb (77.111 kg)  BMI 30.12 kg/m2  SpO2 99%  Physical Exam  Constitutional: She is oriented to person, place, and time. She appears well-developed and well-nourished. No distress.  HENT:  Head: Normocephalic and atraumatic.  Right Ear: External ear normal.  Left Ear: External ear normal.  Eyes: Conjunctivae are normal. No scleral icterus.  Neck: Normal range of motion. Neck supple. No thyromegaly present.  Cardiovascular: Normal rate, regular rhythm, normal heart sounds and intact distal pulses.   Pulmonary/Chest: Effort normal and breath sounds normal. No respiratory distress. She exhibits bony tenderness. She exhibits no mass, no crepitus, no swelling and no retraction.  Musculoskeletal: She exhibits no edema.  Lymphadenopathy:    She has no cervical adenopathy.  Neurological: She is alert and oriented to person, place, and time.  Skin: Skin is warm and dry. She is not diaphoretic. No erythema.  Psychiatric: Her behavior is normal. Her mood appears anxious.   EKG w/o acute ischemic change    [facility] reading (PRIMARY) by  Dr. Clelia CroftShaw. Acute abd series: No acute abnormality, copious constipation, no abnormality in chest. Assessment & Plan:   1. Chest pain, unspecified chest pain type - no concern for anginal/ACS etiology or pulmonary pathology currently - suspect due to GERD poss exac by severe consitpation vs costochondritis vs pleuritis and all likely exacerbased by  Anxiety. If worsening, cont or any alarm sxs,  RTC immed or call [id number]  2. Chronic constipation   3. Pleuritic chest pain   4. Substernal precordial chest pain   5. Episodic mood disorder   6. Panic attack   7. Family history of coronary artery disease   8. Other migraine without status migrainosus, not intractable   9. Secondary amenorrhea     Orders Placed This Encounter  Procedures  . DG Abd Acute W/Chest    Order Specific Question:  Reason for exam:    Answer:  lower sternal pleuritic chest pain, worsening chronic constipation    Order Specific Question:  Is the patient pregnant?    Answer:  No    Order Specific Question:  Preferred imaging location?    Answer:  External  . Comprehensive metabolic  panel  . Lipase  . POCT CBC  . POCT SEDIMENTATION RATE  . POCT glycosylated hemoglobin (Hb A1C)  . POCT urine pregnancy  . EKG 12-Lead    Meds ordered this encounter  Medications  . ALPRAZolam Prudy Feeler) tablet 0.5 mg    Sig:   . gi cocktail (Maalox,Lidocaine,Donnatal)    Sig:   . polyethylene glycol powder (GLYCOLAX/MIRALAX) powder    Sig: Take 5 capfuls of Miralax in 32 ounces of fluids.    Dispense:  255 g    Refill:  11  . ranitidine (ZANTAC) 150 MG tablet    Sig: Take 1 tablet (150 mg total) by mouth 2 (two) times daily.    Dispense:  60 tablet    Refill:  0  . meloxicam (MOBIC) 7.5 MG tablet    Sig: Take 1 tablet (7.5 mg total) by mouth daily.    Dispense:  30 tablet    Refill:  1  . LORazepam (ATIVAN) 0.5 MG tablet    Sig: Take 1 tablet (0.5 mg total) by mouth 2 (two) times daily as needed for anxiety.    Dispense:  30 tablet    Refill:  0  . sertraline (ZOLOFT) 25 MG tablet    Sig: Take 1 tablet (25 mg total) by mouth daily. Then increase to 2 tabs in 1 week and increase to 3 tabs 1-2 weeks after that.    Dispense:  90 tablet    Refill:  0   Urgent service needed due to sxs.  Over 40 min spent in face-to-face evaluation of and consultation with patient and coordination of care.  Over 50% of this time was  spent counseling this patient.   Norberto Sorenson, MD MPH  Results for orders placed or performed in visit on 07/16/14  Comprehensive metabolic panel  Result Value Ref Range   Sodium 141 135 - 145 mEq/L   Potassium 4.2 3.5 - 5.3 mEq/L   Chloride 107 96 - 112 mEq/L   CO2 26 19 - 32 mEq/L   Glucose, Bld 82 70 - 99 mg/dL   BUN 10 6 - 23 mg/dL   Creat 1.61 0.96 - 0.45 mg/dL   Total Bilirubin 0.4 0.2 - 1.2 mg/dL   Alkaline Phosphatase 65 39 - 117 U/L   AST 16 0 - 37 U/L   ALT 19 0 - 35 U/L   Total Protein 7.1 6.0 - 8.3 g/dL   Albumin 4.2 3.5 - 5.2 g/dL   Calcium 40.9 8.4 - 81.1 mg/dL  Lipase  Result Value Ref Range   Lipase 35 0 - 75 U/L  POCT CBC  Result Value Ref Range   WBC 8.3 4.6 - 10.2 K/uL   Lymph, poc 2.1 0.6 - 3.4   POC LYMPH PERCENT 25.9 10 - 50 %L   MID (cbc) 0.5 0 - 0.9   POC MID % 6.3 0 - 12 %M   POC Granulocyte 5.6 2 - 6.9   Granulocyte percent 67.8 37 - 80 %G   RBC 4.69 4.04 - 5.48 M/uL   Hemoglobin 14.2 12.2 - 16.2 g/dL   HCT, POC 91.4 78.2 - 47.9 %   MCV 93.1 80 - 97 fL   MCH, POC 30.3 27 - 31.2 pg   MCHC 32.5 31.8 - 35.4 g/dL   RDW, POC 95.6 %   Platelet Count, POC 241 142 - 424 K/uL   MPV 7.0 0 - 99.8 fL  POCT SEDIMENTATION RATE  Result Value Ref Range  POCT SED RATE 17 0 - 22 mm/hr  POCT glycosylated hemoglobin (Hb A1C)  Result Value Ref Range   Hemoglobin A1C 5.2   POCT urine pregnancy  Result Value Ref Range   Preg Test, Ur Negative

## 2014-07-17 ENCOUNTER — Encounter: Payer: Self-pay | Admitting: Family Medicine

## 2014-07-17 ENCOUNTER — Encounter: Payer: Self-pay | Admitting: Podiatry

## 2014-07-17 ENCOUNTER — Ambulatory Visit (INDEPENDENT_AMBULATORY_CARE_PROVIDER_SITE_OTHER): Payer: BLUE CROSS/BLUE SHIELD | Admitting: Podiatry

## 2014-07-17 VITALS — BP 120/81 | HR 85 | Resp 12

## 2014-07-17 DIAGNOSIS — M722 Plantar fascial fibromatosis: Secondary | ICD-10-CM

## 2014-07-17 MED ORDER — NAFTIFINE HCL 2 % EX GEL: 1.0000 "application " | Freq: Every day | CUTANEOUS | Status: DC

## 2014-07-17 MED ORDER — TRIAMCINOLONE ACETONIDE 10 MG/ML IJ SUSP
10.0000 mg | Freq: Once | INTRAMUSCULAR | Status: AC
  Administered 2014-07-17: 10 mg

## 2014-07-17 NOTE — Patient Instructions (Signed)
Plantar Fasciitis (Heel Spur Syndrome) with Rehab The plantar fascia is a fibrous, ligament-like, soft-tissue structure that spans the bottom of the foot. Plantar fasciitis is a condition that causes pain in the foot due to inflammation of the tissue. SYMPTOMS   Pain and tenderness on the underneath side of the foot.  Pain that worsens with standing or walking. CAUSES  Plantar fasciitis is caused by irritation and injury to the plantar fascia on the underneath side of the foot. Common mechanisms of injury include:  Direct trauma to bottom of the foot.  Damage to a small nerve that runs under the foot where the main fascia attaches to the heel bone.  Stress placed on the plantar fascia due to bone spurs. RISK INCREASES WITH:   Activities that place stress on the plantar fascia (running, jumping, pivoting, or cutting).  Poor strength and flexibility.  Improperly fitted shoes.  Tight calf muscles.  Flat feet.  Failure to warm-up properly before activity.  Obesity. PREVENTION  Warm up and stretch properly before activity.  Allow for adequate recovery between workouts.  Maintain physical fitness:  Strength, flexibility, and endurance.  Cardiovascular fitness.  Maintain a health body weight.  Avoid stress on the plantar fascia.  Wear properly fitted shoes, including arch supports for individuals who have flat feet. PROGNOSIS  If treated properly, then the symptoms of plantar fasciitis usually resolve without surgery. However, occasionally surgery is necessary. RELATED COMPLICATIONS   Recurrent symptoms that may result in a chronic condition.  Problems of the lower back that are caused by compensating for the injury, such as limping.  Pain or weakness of the foot during push-off following surgery.  Chronic inflammation, scarring, and partial or complete fascia tear, occurring more often from repeated injections. TREATMENT  Treatment initially involves the use of  ice and medication to help reduce pain and inflammation. The use of strengthening and stretching exercises may help reduce pain with activity, especially stretches of the Achilles tendon. These exercises may be performed at home or with a therapist. Your caregiver may recommend that you use heel cups of arch supports to help reduce stress on the plantar fascia. Occasionally, corticosteroid injections are given to reduce inflammation. If symptoms persist for greater than 6 months despite non-surgical (conservative), then surgery may be recommended.  MEDICATION   If pain medication is necessary, then nonsteroidal anti-inflammatory medications, such as aspirin and ibuprofen, or other minor pain relievers, such as acetaminophen, are often recommended.  Do not take pain medication within 7 days before surgery.  Prescription pain relievers may be given if deemed necessary by your caregiver. Use only as directed and only as much as you need.  Corticosteroid injections may be given by your caregiver. These injections should be reserved for the most serious cases, because they may only be given a certain number of times. HEAT AND COLD  Cold treatment (icing) relieves pain and reduces inflammation. Cold treatment should be applied for 10 to 15 minutes every 2 to 3 hours for inflammation and pain and immediately after any activity that aggravates your symptoms. Use ice packs or massage the area with a piece of ice (ice massage).  Heat treatment may be used prior to performing the stretching and strengthening activities prescribed by your caregiver, physical therapist, or athletic trainer. Use a heat pack or soak the injury in warm water. SEEK IMMEDIATE MEDICAL CARE IF:  Treatment seems to offer no benefit, or the condition worsens.  Any medications produce adverse side effects. EXERCISES RANGE   OF MOTION (ROM) AND STRETCHING EXERCISES - Plantar Fasciitis (Heel Spur Syndrome) These exercises may help you  when beginning to rehabilitate your injury. Your symptoms may resolve with or without further involvement from your physician, physical therapist or athletic trainer. While completing these exercises, remember:   Restoring tissue flexibility helps normal motion to return to the joints. This allows healthier, less painful movement and activity.  An effective stretch should be held for at least 30 seconds.  A stretch should never be painful. You should only feel a gentle lengthening or release in the stretched tissue. RANGE OF MOTION - Toe Extension, Flexion  Sit with your right / left leg crossed over your opposite knee.  Grasp your toes and gently pull them back toward the top of your foot. You should feel a stretch on the bottom of your toes and/or foot.  Hold this stretch for __________ seconds.  Now, gently pull your toes toward the bottom of your foot. You should feel a stretch on the top of your toes and or foot.  Hold this stretch for __________ seconds. Repeat __________ times. Complete this stretch __________ times per day.  RANGE OF MOTION - Ankle Dorsiflexion, Active Assisted  Remove shoes and sit on a chair that is preferably not on a carpeted surface.  Place right / left foot under knee. Extend your opposite leg for support.  Keeping your heel down, slide your right / left foot back toward the chair until you feel a stretch at your ankle or calf. If you do not feel a stretch, slide your bottom forward to the edge of the chair, while still keeping your heel down.  Hold this stretch for __________ seconds. Repeat __________ times. Complete this stretch __________ times per day.  STRETCH - Gastroc, Standing  Place hands on wall.  Extend right / left leg, keeping the front knee somewhat bent.  Slightly point your toes inward on your back foot.  Keeping your right / left heel on the floor and your knee straight, shift your weight toward the wall, not allowing your back to  arch.  You should feel a gentle stretch in the right / left calf. Hold this position for __________ seconds. Repeat __________ times. Complete this stretch __________ times per day. STRETCH - Soleus, Standing  Place hands on wall.  Extend right / left leg, keeping the other knee somewhat bent.  Slightly point your toes inward on your back foot.  Keep your right / left heel on the floor, bend your back knee, and slightly shift your weight over the back leg so that you feel a gentle stretch deep in your back calf.  Hold this position for __________ seconds. Repeat __________ times. Complete this stretch __________ times per day. STRETCH - Gastrocsoleus, Standing  Note: This exercise can place a lot of stress on your foot and ankle. Please complete this exercise only if specifically instructed by your caregiver.   Place the ball of your right / left foot on a step, keeping your other foot firmly on the same step.  Hold on to the wall or a rail for balance.  Slowly lift your other foot, allowing your body weight to press your heel down over the edge of the step.  You should feel a stretch in your right / left calf.  Hold this position for __________ seconds.  Repeat this exercise with a slight bend in your right / left knee. Repeat __________ times. Complete this stretch __________ times per day.    STRENGTHENING EXERCISES - Plantar Fasciitis (Heel Spur Syndrome)  These exercises may help you when beginning to rehabilitate your injury. They may resolve your symptoms with or without further involvement from your physician, physical therapist or athletic trainer. While completing these exercises, remember:   Muscles can gain both the endurance and the strength needed for everyday activities through controlled exercises.  Complete these exercises as instructed by your physician, physical therapist or athletic trainer. Progress the resistance and repetitions only as guided. STRENGTH -  Towel Curls  Sit in a chair positioned on a non-carpeted surface.  Place your foot on a towel, keeping your heel on the floor.  Pull the towel toward your heel by only curling your toes. Keep your heel on the floor.  If instructed by your physician, physical therapist or athletic trainer, add ____________________ at the end of the towel. Repeat __________ times. Complete this exercise __________ times per day. STRENGTH - Ankle Inversion  Secure one end of a rubber exercise band/tubing to a fixed object (table, pole). Loop the other end around your foot just before your toes.  Place your fists between your knees. This will focus your strengthening at your ankle.  Slowly, pull your big toe up and in, making sure the band/tubing is positioned to resist the entire motion.  Hold this position for __________ seconds.  Have your muscles resist the band/tubing as it slowly pulls your foot back to the starting position. Repeat __________ times. Complete this exercises __________ times per day.  Document Released: 02/13/2005 Document Revised: 05/08/2011 Document Reviewed: 05/28/2008 [employer] Patient Information 2015 [employer]. This information is not intended to replace advice given to you by your health care provider. Make sure you discuss any questions you have with your health care provider.  

## 2014-07-20 NOTE — Progress Notes (Signed)
Patient ID: [name], female   DOB: 09/06/1965, 49 y.o.   MRN: [id number]  Subjective: 49 year old female presents the office today for follow-up evaluation of bilateral foot pain. She states the majority the pain is in the heel and goes into the arch of her foot. She states that she has tried new shoes which did not seem to help much. She did go to urgent care today for an unrelated issue and prescribed meloxicam. She states that she has pain after periods of rest or in activity which is relieved by ambulation. Denies any numbness or tingling. Denies any swelling or redness. Denies any history of injury or trauma or any change or increase in activity the time of onset of symptoms. No other complaints at this time in no acute changes since last appointment.  Objective: AAO x3, NAD DP/PT pulses palpable bilaterally, CRT less than 3 seconds Protective sensation intact with Simms Weinstein monofilament, vibratory sensation intact, Achilles tendon reflex intact Tenderness to palpation overlying the plantar medial tubercle of the calcaneus to bilateral heels at the insertion of the plantar fascia. There is mild pain along the course of the medial band of the plantar fascia within the arch of the foot.The plantar fascia appears intact bilaterally.  There is no pain with lateral compression of the calcaneus or pain the vibratory sensation. No pain on the posterior aspect of the calcaneus or along the course/insertion of the Achilles tendon. There is no overlying edema, erythema, increase in warmth. No other areas of tenderness palpation or pain with vibratory sensation to the foot/ankle. MMT 5/5, ROM WNL No open lesions or pre-ulcerative lesions are identified. No pain with calf compression, swelling, warmth, erythema.  Assessment: 49 year old female with bilateral heel pain, plantar fasciitis  Plan: -Treatment options were discussed include alternatives, risks, complications. -Patient elects to  proceed with steroid injection into the bilateral heels. Under sterile skin preparation, a total of 2.5cc of kenalog 10, 0.5% Marcaine plain, and 2% lidocaine plain were infiltrated into the symptomatic area without complication. A band-aid was applied. Patient tolerated the injection well without complication. Post-injection care with discussed with the patient. Discussed with the patient to ice the area over the next couple of days to help prevent a steroid flare.  -Continue stretching exercises. -Dispensed plantar fascial braces -Ice to the area -Can take meloxicam for this as well. -Discussed supportive shoe gear. Recommended to wear supportive shoes at all time and not to go barefoot even while at home. -Discussed orthotics -Follow up in 3 weeks or sooner if any problems are to arise. Call any questions or concerns in the meantime.

## 2014-07-26 ENCOUNTER — Other Ambulatory Visit: Payer: Self-pay | Admitting: Physician Assistant

## 2014-08-14 ENCOUNTER — Ambulatory Visit (INDEPENDENT_AMBULATORY_CARE_PROVIDER_SITE_OTHER): Payer: BLUE CROSS/BLUE SHIELD | Admitting: Podiatry

## 2014-08-14 ENCOUNTER — Encounter: Payer: Self-pay | Admitting: Podiatry

## 2014-08-14 VITALS — BP 105/66 | HR 83 | Resp 18

## 2014-08-14 DIAGNOSIS — M722 Plantar fascial fibromatosis: Secondary | ICD-10-CM

## 2014-08-14 MED ORDER — TRIAMCINOLONE ACETONIDE 10 MG/ML IJ SUSP
10.0000 mg | Freq: Once | INTRAMUSCULAR | Status: AC
  Administered 2014-08-14: 10 mg

## 2014-08-14 NOTE — Patient Instructions (Signed)
Plantar Fasciitis (Heel Spur Syndrome) with Rehab The plantar fascia is a fibrous, ligament-like, soft-tissue structure that spans the bottom of the foot. Plantar fasciitis is a condition that causes pain in the foot due to inflammation of the tissue. SYMPTOMS   Pain and tenderness on the underneath side of the foot.  Pain that worsens with standing or walking. CAUSES  Plantar fasciitis is caused by irritation and injury to the plantar fascia on the underneath side of the foot. Common mechanisms of injury include:  Direct trauma to bottom of the foot.  Damage to a small nerve that runs under the foot where the main fascia attaches to the heel bone.  Stress placed on the plantar fascia due to bone spurs. RISK INCREASES WITH:   Activities that place stress on the plantar fascia (running, jumping, pivoting, or cutting).  Poor strength and flexibility.  Improperly fitted shoes.  Tight calf muscles.  Flat feet.  Failure to warm-up properly before activity.  Obesity. PREVENTION  Warm up and stretch properly before activity.  Allow for adequate recovery between workouts.  Maintain physical fitness:  Strength, flexibility, and endurance.  Cardiovascular fitness.  Maintain a health body weight.  Avoid stress on the plantar fascia.  Wear properly fitted shoes, including arch supports for individuals who have flat feet. PROGNOSIS  If treated properly, then the symptoms of plantar fasciitis usually resolve without surgery. However, occasionally surgery is necessary. RELATED COMPLICATIONS   Recurrent symptoms that may result in a chronic condition.  Problems of the lower back that are caused by compensating for the injury, such as limping.  Pain or weakness of the foot during push-off following surgery.  Chronic inflammation, scarring, and partial or complete fascia tear, occurring more often from repeated injections. TREATMENT  Treatment initially involves the use of  ice and medication to help reduce pain and inflammation. The use of strengthening and stretching exercises may help reduce pain with activity, especially stretches of the Achilles tendon. These exercises may be performed at home or with a therapist. Your caregiver may recommend that you use heel cups of arch supports to help reduce stress on the plantar fascia. Occasionally, corticosteroid injections are given to reduce inflammation. If symptoms persist for greater than 6 months despite non-surgical (conservative), then surgery may be recommended.  MEDICATION   If pain medication is necessary, then nonsteroidal anti-inflammatory medications, such as aspirin and ibuprofen, or other minor pain relievers, such as acetaminophen, are often recommended.  Do not take pain medication within 7 days before surgery.  Prescription pain relievers may be given if deemed necessary by your caregiver. Use only as directed and only as much as you need.  Corticosteroid injections may be given by your caregiver. These injections should be reserved for the most serious cases, because they may only be given a certain number of times. HEAT AND COLD  Cold treatment (icing) relieves pain and reduces inflammation. Cold treatment should be applied for 10 to 15 minutes every 2 to 3 hours for inflammation and pain and immediately after any activity that aggravates your symptoms. Use ice packs or massage the area with a piece of ice (ice massage).  Heat treatment may be used prior to performing the stretching and strengthening activities prescribed by your caregiver, physical therapist, or athletic trainer. Use a heat pack or soak the injury in warm water. SEEK IMMEDIATE MEDICAL CARE IF:  Treatment seems to offer no benefit, or the condition worsens.  Any medications produce adverse side effects. EXERCISES RANGE   OF MOTION (ROM) AND STRETCHING EXERCISES - Plantar Fasciitis (Heel Spur Syndrome) These exercises may help you  when beginning to rehabilitate your injury. Your symptoms may resolve with or without further involvement from your physician, physical therapist or athletic trainer. While completing these exercises, remember:   Restoring tissue flexibility helps normal motion to return to the joints. This allows healthier, less painful movement and activity.  An effective stretch should be held for at least 30 seconds.  A stretch should never be painful. You should only feel a gentle lengthening or release in the stretched tissue. RANGE OF MOTION - Toe Extension, Flexion  Sit with your right / left leg crossed over your opposite knee.  Grasp your toes and gently pull them back toward the top of your foot. You should feel a stretch on the bottom of your toes and/or foot.  Hold this stretch for __________ seconds.  Now, gently pull your toes toward the bottom of your foot. You should feel a stretch on the top of your toes and or foot.  Hold this stretch for __________ seconds. Repeat __________ times. Complete this stretch __________ times per day.  RANGE OF MOTION - Ankle Dorsiflexion, Active Assisted  Remove shoes and sit on a chair that is preferably not on a carpeted surface.  Place right / left foot under knee. Extend your opposite leg for support.  Keeping your heel down, slide your right / left foot back toward the chair until you feel a stretch at your ankle or calf. If you do not feel a stretch, slide your bottom forward to the edge of the chair, while still keeping your heel down.  Hold this stretch for __________ seconds. Repeat __________ times. Complete this stretch __________ times per day.  STRETCH - Gastroc, Standing  Place hands on wall.  Extend right / left leg, keeping the front knee somewhat bent.  Slightly point your toes inward on your back foot.  Keeping your right / left heel on the floor and your knee straight, shift your weight toward the wall, not allowing your back to  arch.  You should feel a gentle stretch in the right / left calf. Hold this position for __________ seconds. Repeat __________ times. Complete this stretch __________ times per day. STRETCH - Soleus, Standing  Place hands on wall.  Extend right / left leg, keeping the other knee somewhat bent.  Slightly point your toes inward on your back foot.  Keep your right / left heel on the floor, bend your back knee, and slightly shift your weight over the back leg so that you feel a gentle stretch deep in your back calf.  Hold this position for __________ seconds. Repeat __________ times. Complete this stretch __________ times per day. STRETCH - Gastrocsoleus, Standing  Note: This exercise can place a lot of stress on your foot and ankle. Please complete this exercise only if specifically instructed by your caregiver.   Place the ball of your right / left foot on a step, keeping your other foot firmly on the same step.  Hold on to the wall or a rail for balance.  Slowly lift your other foot, allowing your body weight to press your heel down over the edge of the step.  You should feel a stretch in your right / left calf.  Hold this position for __________ seconds.  Repeat this exercise with a slight bend in your right / left knee. Repeat __________ times. Complete this stretch __________ times per day.    STRENGTHENING EXERCISES - Plantar Fasciitis (Heel Spur Syndrome)  These exercises may help you when beginning to rehabilitate your injury. They may resolve your symptoms with or without further involvement from your physician, physical therapist or athletic trainer. While completing these exercises, remember:   Muscles can gain both the endurance and the strength needed for everyday activities through controlled exercises.  Complete these exercises as instructed by your physician, physical therapist or athletic trainer. Progress the resistance and repetitions only as guided. STRENGTH -  Towel Curls  Sit in a chair positioned on a non-carpeted surface.  Place your foot on a towel, keeping your heel on the floor.  Pull the towel toward your heel by only curling your toes. Keep your heel on the floor.  If instructed by your physician, physical therapist or athletic trainer, add ____________________ at the end of the towel. Repeat __________ times. Complete this exercise __________ times per day. STRENGTH - Ankle Inversion  Secure one end of a rubber exercise band/tubing to a fixed object (table, pole). Loop the other end around your foot just before your toes.  Place your fists between your knees. This will focus your strengthening at your ankle.  Slowly, pull your big toe up and in, making sure the band/tubing is positioned to resist the entire motion.  Hold this position for __________ seconds.  Have your muscles resist the band/tubing as it slowly pulls your foot back to the starting position. Repeat __________ times. Complete this exercises __________ times per day.  Document Released: 02/13/2005 Document Revised: 05/08/2011 Document Reviewed: 05/28/2008 [employer] Patient Information 2015 [employer]. This information is not intended to replace advice given to you by your health care provider. Make sure you discuss any questions you have with your health care provider.  

## 2014-08-21 NOTE — Progress Notes (Signed)
Patient ID: [name], female   DOB: [birth year], 49 y.o.   MRN: [id number]  Subjective: 49 year old female presents the office today follow-up evaluation of bilateral heel pain, plantar fasciitis. She states that she was doing better for a couple weeks after the injection however he started to become more painful over the last week. She also states that she's been continuing the braces which seem to help. She is continuing stretching, icing activities. Denies any numbness or feeling. No other complaints this time in no acute changes since last appointment.  Objective: AAO x3, NAD DP/PT pulses palpable bilaterally, CRT less than 3 seconds Protective sensation intact with Simms Weinstein monofilament, vibratory sensation intact, Achilles tendon reflex intact There is continued tenderness to palpation overlying the plantar medial tubercle of the calcaneus to bilateral heels at the insertion of the plantar fascia, although it has decreased. There is no pain along the course of plantar fascial within the arch of the foot. There is no pain with lateral compression of the calcaneus or pain the vibratory sensation. No pain on the posterior aspect of the calcaneus or along the course/insertion of the Achilles tendon. There is no overlying edema, erythema, increase in warmth. No other areas of tenderness palpation or pain with vibratory sensation to the foot/ankle. MMT 5/5, ROM WNL. Equinus present.  No open lesions or pre-ulcerative lesions are identified. No pain with calf compression, swelling, warmth, erythema.  Assessment: 49 year old female with bilateral heel pain, plantar fasciitis which seems to resolving.  Plan: -Treatment options discussed including all alternatives, risks, and complications Patient elects to proceed with steroid injection into the bilateral heels. Under sterile skin preparation, a total of 2.5cc of kenalog 10, 0.5% Marcaine plain, and 2% lidocaine plain were infiltrated  into the symptomatic area without complication. A band-aid was applied. Patient tolerated the injection well without complication. Post-injection care with discussed with the patient. Discussed with the patient to ice the area over the next couple of days to help prevent a steroid flare.  -Dispensed night splint. -Continue icing and stretching activities on a consistent basis. -Discussed shoe gear modifications and orthotics. --Follow-up 3 weeks or sooner if any problems arise. In the meantime, encouraged to call the office with any questions, concerns, change in symptoms.

## 2014-08-28 ENCOUNTER — Encounter: Payer: BLUE CROSS/BLUE SHIELD | Admitting: Family Medicine

## 2014-08-28 DIAGNOSIS — F413 Other mixed anxiety disorders: Secondary | ICD-10-CM | POA: Insufficient documentation

## 2014-08-28 NOTE — Progress Notes (Signed)
This encounter was created in error - please disregard.

## 2014-08-28 NOTE — Progress Notes (Deleted)
   Subjective:    Patient ID: [name], female    DOB: [birth year], 49 y.o.   MRN: [id number]  HPI   I saw pt 6 wks prior atypical CP. Suspected due to GERD vs costchondritis vs pleuritis and/or anxiety. Started on zantac and meloxicam.  Had nml cbc, cmp, lipase, esr, a1c, neg upt, nml acute abdomen series (PA Chest).  EKG was non-specifically atypical w/ flipped T waves in V3, V4, III with LBBB   + FHx of CAD in mother.    Chronic constipation:  Did miralax cleanout after last visit  Anxiety/Panic:  At last visit 6 wks prior pt was started on zoloft 25 and titrated up to 75 which she has been on for .  Prn bid ativan 0.$RemoveBefo'5mg'whgMMVnotvs$ .  Migraines:  Menorrhagia  Review of Systems  Gastrointestinal: Positive for constipation.       Objective:   Physical Exam        Assessment & Plan:  Pt has not had CPE in many years - schedule Moderate hematuria in all prior urine dips for 3 years sometimes microscopy confirms. Repeat EKG Needs tsh and lipid panel, cons vitamin D - enter if pt fasting; otherwise will defer labs until her CPE next visit

## 2014-09-18 ENCOUNTER — Ambulatory Visit: Payer: BLUE CROSS/BLUE SHIELD | Admitting: Podiatry

## 2014-09-28 ENCOUNTER — Ambulatory Visit (INDEPENDENT_AMBULATORY_CARE_PROVIDER_SITE_OTHER): Payer: BLUE CROSS/BLUE SHIELD | Admitting: Podiatry

## 2014-09-28 ENCOUNTER — Encounter: Payer: Self-pay | Admitting: Podiatry

## 2014-09-28 VITALS — BP 118/77 | HR 87 | Resp 14

## 2014-09-28 DIAGNOSIS — M722 Plantar fascial fibromatosis: Secondary | ICD-10-CM

## 2014-09-28 MED ORDER — TRIAMCINOLONE ACETONIDE 10 MG/ML IJ SUSP
10.0000 mg | Freq: Once | INTRAMUSCULAR | Status: AC
  Administered 2014-09-28: 10 mg

## 2014-09-28 MED ORDER — METHYLPREDNISOLONE 4 MG PO TBPK: ORAL_TABLET | ORAL | Status: DC

## 2014-09-28 NOTE — Progress Notes (Signed)
Patient ID: [name], female   DOB: 05/21/1965, 49 y.o.   MRN: [id number]  Subjective: 49 year old female presents the office they for follow-up evaluation of bilateral heel pain, plantar fasciitis of the right side worse than the left. She states that she continues his stretching exercises as well as icing daily. She was wearing the plantar fascial braces which seem to help at first however she stopped wearing them as she thinks and there were no longer effective. She has tried over-the-counter inserts without much relief. She does try changed her shoes which helped some. She states the injections helped for a couple weeks before the pain starts to recur. Denies any numbness or tingling. Denies any swelling or redness. The pain does not wake her up at night. No other complaints at this time. No acute changes since last appointment.  Objective: AAO x3, NAD DP/PT pulses palpable bilaterally, CRT less than 3 seconds Protective sensation intact with Simms Weinstein monofilament, vibratory sensation intact, Achilles tendon reflex intact There is continued mild tenderness to palpation overlying the plantar medial tubercle of the calcaneus to bilateral heels at the insertion of the plantar fascia with the right > left. There is no pain along the course of plantar fascia within the arch of the foot. There is no pain with lateral compression of the calcaneus or pain the vibratory sensation. No pain on the posterior aspect of the calcaneus or along the course/insertion of the Achilles tendon.  There is no overlying edema, erythema, increase in warmth.  No other areas of tenderness palpation or pain with vibratory sensation to the foot/ankle. MMT 5/5, ROM WNL No open lesions or pre-ulcerative lesions are identified. No pain with calf compression, swelling, warmth, erythema.  Assessment: 49 year old female bilateral heel pain, plantar fasciitis  Plan: -Treatment options discussed including all  alternatives, risks, and complications -Patient elects to proceed with steroid injection into the bilateral heels. Under sterile skin preparation, a total of 2.5cc of kenalog 10, 0.5% Marcaine plain, and 2% lidocaine plain were infiltrated into the symptomatic area without complication. A band-aid was applied. Patient tolerated the injection well without complication. Post-injection care with discussed with the patient. Discussed with the patient to ice the area over the next couple of days to help prevent a steroid flare. This is the 3rd injection.  -Described Medrol Dosepak. Hold off on anti-inflammatories while taking this. Discussed side effects the medication directed to call the office should any occur. -Continue stretching icing on a consistent basis. -Recommended continue with supportive shoe at all times. I would try to wear the inserts. Discussed custom orthotics. -Plantar fascial tapings applied.  -If the next appointment symptoms persist we'll likely refer to physical therapy and/or discussed surgery.  -Follow-up 4 weeks or sooner if any problems arise. In the meantime, encouraged to call the office with any questions, concerns, change in symptoms.   Ovid Curd, DPM

## 2014-10-26 ENCOUNTER — Encounter: Payer: Self-pay | Admitting: Podiatry

## 2014-10-26 ENCOUNTER — Ambulatory Visit (INDEPENDENT_AMBULATORY_CARE_PROVIDER_SITE_OTHER): Payer: BLUE CROSS/BLUE SHIELD | Admitting: Podiatry

## 2014-10-26 VITALS — BP 102/71 | HR 77 | Resp 16

## 2014-10-26 DIAGNOSIS — M2040 Other hammer toe(s) (acquired), unspecified foot: Secondary | ICD-10-CM | POA: Diagnosis not present

## 2014-10-26 DIAGNOSIS — M722 Plantar fascial fibromatosis: Secondary | ICD-10-CM | POA: Diagnosis not present

## 2014-10-26 NOTE — Patient Instructions (Signed)
Follow-up after PT  Plantar Fasciitis (Heel Spur Syndrome) with Rehab The plantar fascia is a fibrous, ligament-like, soft-tissue structure that spans the bottom of the foot. Plantar fasciitis is a condition that causes pain in the foot due to inflammation of the tissue. SYMPTOMS   Pain and tenderness on the underneath side of the foot.  Pain that worsens with standing or walking. CAUSES  Plantar fasciitis is caused by irritation and injury to the plantar fascia on the underneath side of the foot. Common mechanisms of injury include:  Direct trauma to bottom of the foot.  Damage to a small nerve that runs under the foot where the main fascia attaches to the heel bone.  Stress placed on the plantar fascia due to bone spurs. RISK INCREASES WITH:   Activities that place stress on the plantar fascia (running, jumping, pivoting, or cutting).  Poor strength and flexibility.  Improperly fitted shoes.  Tight calf muscles.  Flat feet.  Failure to warm-up properly before activity.  Obesity. PREVENTION  Warm up and stretch properly before activity.  Allow for adequate recovery between workouts.  Maintain physical fitness:  Strength, flexibility, and endurance.  Cardiovascular fitness.  Maintain a health body weight.  Avoid stress on the plantar fascia.  Wear properly fitted shoes, including arch supports for individuals who have flat feet. PROGNOSIS  If treated properly, then the symptoms of plantar fasciitis usually resolve without surgery. However, occasionally surgery is necessary. RELATED COMPLICATIONS   Recurrent symptoms that may result in a chronic condition.  Problems of the lower back that are caused by compensating for the injury, such as limping.  Pain or weakness of the foot during push-off following surgery.  Chronic inflammation, scarring, and partial or complete fascia tear, occurring more often from repeated injections. TREATMENT  Treatment initially  involves the use of ice and medication to help reduce pain and inflammation. The use of strengthening and stretching exercises may help reduce pain with activity, especially stretches of the Achilles tendon. These exercises may be performed at home or with a therapist. Your caregiver may recommend that you use heel cups of arch supports to help reduce stress on the plantar fascia. Occasionally, corticosteroid injections are given to reduce inflammation. If symptoms persist for greater than 6 months despite non-surgical (conservative), then surgery may be recommended.  MEDICATION   If pain medication is necessary, then nonsteroidal anti-inflammatory medications, such as aspirin and ibuprofen, or other minor pain relievers, such as acetaminophen, are often recommended.  Do not take pain medication within 7 days before surgery.  Prescription pain relievers may be given if deemed necessary by your caregiver. Use only as directed and only as much as you need.  Corticosteroid injections may be given by your caregiver. These injections should be reserved for the most serious cases, because they may only be given a certain number of times. HEAT AND COLD  Cold treatment (icing) relieves pain and reduces inflammation. Cold treatment should be applied for 10 to 15 minutes every 2 to 3 hours for inflammation and pain and immediately after any activity that aggravates your symptoms. Use ice packs or massage the area with a piece of ice (ice massage).  Heat treatment may be used prior to performing the stretching and strengthening activities prescribed by your caregiver, physical therapist, or athletic trainer. Use a heat pack or soak the injury in warm water. SEEK IMMEDIATE MEDICAL CARE IF:  Treatment seems to offer no benefit, or the condition worsens.  Any medications produce adverse  side effects. EXERCISES RANGE OF MOTION (ROM) AND STRETCHING EXERCISES - Plantar Fasciitis (Heel Spur Syndrome) These  exercises may help you when beginning to rehabilitate your injury. Your symptoms may resolve with or without further involvement from your physician, physical therapist or athletic trainer. While completing these exercises, remember:   Restoring tissue flexibility helps normal motion to return to the joints. This allows healthier, less painful movement and activity.  An effective stretch should be held for at least 30 seconds.  A stretch should never be painful. You should only feel a gentle lengthening or release in the stretched tissue. RANGE OF MOTION - Toe Extension, Flexion  Sit with your right / left leg crossed over your opposite knee.  Grasp your toes and gently pull them back toward the top of your foot. You should feel a stretch on the bottom of your toes and/or foot.  Hold this stretch for __________ seconds.  Now, gently pull your toes toward the bottom of your foot. You should feel a stretch on the top of your toes and or foot.  Hold this stretch for __________ seconds. Repeat __________ times. Complete this stretch __________ times per day.  RANGE OF MOTION - Ankle Dorsiflexion, Active Assisted  Remove shoes and sit on a chair that is preferably not on a carpeted surface.  Place right / left foot under knee. Extend your opposite leg for support.  Keeping your heel down, slide your right / left foot back toward the chair until you feel a stretch at your ankle or calf. If you do not feel a stretch, slide your bottom forward to the edge of the chair, while still keeping your heel down.  Hold this stretch for __________ seconds. Repeat __________ times. Complete this stretch __________ times per day.  STRETCH - Gastroc, Standing  Place hands on wall.  Extend right / left leg, keeping the front knee somewhat bent.  Slightly point your toes inward on your back foot.  Keeping your right / left heel on the floor and your knee straight, shift your weight toward the wall, not  allowing your back to arch.  You should feel a gentle stretch in the right / left calf. Hold this position for __________ seconds. Repeat __________ times. Complete this stretch __________ times per day. STRETCH - Soleus, Standing  Place hands on wall.  Extend right / left leg, keeping the other knee somewhat bent.  Slightly point your toes inward on your back foot.  Keep your right / left heel on the floor, bend your back knee, and slightly shift your weight over the back leg so that you feel a gentle stretch deep in your back calf.  Hold this position for __________ seconds. Repeat __________ times. Complete this stretch __________ times per day. STRETCH - Gastrocsoleus, Standing  Note: This exercise can place a lot of stress on your foot and ankle. Please complete this exercise only if specifically instructed by your caregiver.   Place the ball of your right / left foot on a step, keeping your other foot firmly on the same step.  Hold on to the wall or a rail for balance.  Slowly lift your other foot, allowing your body weight to press your heel down over the edge of the step.  You should feel a stretch in your right / left calf.  Hold this position for __________ seconds.  Repeat this exercise with a slight bend in your right / left knee. Repeat __________ times. Complete this stretch  __________ times per day.  STRENGTHENING EXERCISES - Plantar Fasciitis (Heel Spur Syndrome)  These exercises may help you when beginning to rehabilitate your injury. They may resolve your symptoms with or without further involvement from your physician, physical therapist or athletic trainer. While completing these exercises, remember:   Muscles can gain both the endurance and the strength needed for everyday activities through controlled exercises.  Complete these exercises as instructed by your physician, physical therapist or athletic trainer. Progress the resistance and repetitions only as  guided. STRENGTH - Towel Curls  Sit in a chair positioned on a non-carpeted surface.  Place your foot on a towel, keeping your heel on the floor.  Pull the towel toward your heel by only curling your toes. Keep your heel on the floor.  If instructed by your physician, physical therapist or athletic trainer, add ____________________ at the end of the towel. Repeat __________ times. Complete this exercise __________ times per day. STRENGTH - Ankle Inversion  Secure one end of a rubber exercise band/tubing to a fixed object (table, pole). Loop the other end around your foot just before your toes.  Place your fists between your knees. This will focus your strengthening at your ankle.  Slowly, pull your big toe up and in, making sure the band/tubing is positioned to resist the entire motion.  Hold this position for __________ seconds.  Have your muscles resist the band/tubing as it slowly pulls your foot back to the starting position. Repeat __________ times. Complete this exercises __________ times per day.  Document Released: 02/13/2005 Document Revised: 05/08/2011 Document Reviewed: 05/28/2008 [facility] Patient Information 2015 [place], [place]. This information is not intended to replace advice given to you by your health care provider. Make sure you discuss any questions you have with your health care provider.

## 2014-10-26 NOTE — Progress Notes (Signed)
Patient ID: [name], female   DOB: [birth year], 49 y.o.   MRN: [id number]  Subjective: 49 year old female presents the office today for follow-up evaluation of bilateral heel pain, plantar fasciitis. She states that she is somewhat better although minimally. She states that the injections helped for a couple weeks however the pain does recur. She continues his stretching, icing daily. She'll also try to wear supportive shoe gear. She states that her eat her a lot after standing or walking all day at work. Denies any numbness or tingling. No swelling or redness. No other complaints at this time. No acute changes since last appointment.  Objective: AAO x3, NAD DP/PT pulses palpable bilaterally, CRT less than 3 seconds Protective sensation intact with Simms Weinstein monofilament, vibratory sensation intact, Achilles tendon reflex intact There is continue tenderness to palpation overlying the plantar medial tubercle of the calcaneus to bilateral heels at the insertion of the plantar fascia. There is no pain along the course of plantar fascia within the arch of the foot and the plantar fascia appears intact. There is no pain with lateral compression of the calcaneus or pain the vibratory sensation. No pain on the posterior aspect of the calcaneus or along the course/insertion of the Achilles tendon.  There is a corn on the lateral aspect of the fourth digit in the medial aspect of the fifth digit with hammertoe deformities. There is no overlying edema, erythema, increase in warmth. There is no overlying edema, erythema, increase in warmth.  No other areas of tenderness palpation or pain with vibratory sensation to the foot/ankle.  No open lesions or pre-ulcerative lesions are identified. No pain with calf compression, swelling, warmth, erythema.  Assessment: 49 year old female with continuation of bilateral heel pain, likely plantar fasciitis; hammertoes right fourth and fifth digit with  corn.  Plan: -Treatment options discussed including all alternatives, risks, and complications -In regards the corn/hammertoes a discussed with her conservative versus surgical treatment options. She has been applying a pad daily. For now she'll continue with this that she does not have time off of work or surgery. In the future she will consider this. -For the plantar fasciitis with a tendon steroid injections, medications, stretching, icing with continuation of pain. Discussed with her orthotics and she would proceed with custom orthotics at this time. She was scanned for orthotics they were sent to [facility] labs. As discussed physical therapy and she was given a prescription for breakthrough physical therapy. -Follow-up after PT or when orthotics arise or sooner if any problems are to arise. In the meantime I encouraged her to call the office with any questions, concerns, change in symptoms.  Ovid Curd, DPM

## 2014-10-27 ENCOUNTER — Telehealth: Payer: Self-pay | Admitting: *Deleted

## 2014-10-27 NOTE — Telephone Encounter (Signed)
Pt states she called her insurance co concerning the orthotics and she can be reached today between 415p - 500pm and tomorrow 215pm - 430pm.

## 2014-11-03 ENCOUNTER — Encounter (HOSPITAL_COMMUNITY): Payer: Self-pay | Admitting: Emergency Medicine

## 2014-11-03 ENCOUNTER — Emergency Department (HOSPITAL_COMMUNITY)
Admission: EM | Admit: 2014-11-03 | Discharge: 2014-11-03 | Disposition: A | Discharge status: Home / Self Care | Payer: Worker's Compensation | Attending: Emergency Medicine | Admitting: Emergency Medicine

## 2014-11-03 ENCOUNTER — Emergency Department (HOSPITAL_COMMUNITY): Payer: Worker's Compensation

## 2014-11-03 DIAGNOSIS — S63501A Unspecified sprain of right wrist, initial encounter: Secondary | ICD-10-CM | POA: Diagnosis not present

## 2014-11-03 DIAGNOSIS — Y998 Other external cause status: Secondary | ICD-10-CM | POA: Insufficient documentation

## 2014-11-03 DIAGNOSIS — Y9289 Other specified places as the place of occurrence of the external cause: Secondary | ICD-10-CM | POA: Diagnosis not present

## 2014-11-03 DIAGNOSIS — Z79899 Other long term (current) drug therapy: Secondary | ICD-10-CM | POA: Insufficient documentation

## 2014-11-03 DIAGNOSIS — Z8719 Personal history of other diseases of the digestive system: Secondary | ICD-10-CM | POA: Diagnosis not present

## 2014-11-03 DIAGNOSIS — W010XXA Fall on same level from slipping, tripping and stumbling without subsequent striking against object, initial encounter: Secondary | ICD-10-CM | POA: Insufficient documentation

## 2014-11-03 DIAGNOSIS — S8001XA Contusion of right knee, initial encounter: Secondary | ICD-10-CM | POA: Diagnosis not present

## 2014-11-03 DIAGNOSIS — F329 Major depressive disorder, single episode, unspecified: Secondary | ICD-10-CM | POA: Diagnosis not present

## 2014-11-03 DIAGNOSIS — F419 Anxiety disorder, unspecified: Secondary | ICD-10-CM | POA: Insufficient documentation

## 2014-11-03 DIAGNOSIS — G8929 Other chronic pain: Secondary | ICD-10-CM | POA: Insufficient documentation

## 2014-11-03 DIAGNOSIS — Y9389 Activity, other specified: Secondary | ICD-10-CM | POA: Diagnosis not present

## 2014-11-03 DIAGNOSIS — W19XXXA Unspecified fall, initial encounter: Secondary | ICD-10-CM

## 2014-11-03 DIAGNOSIS — S6991XA Unspecified injury of right wrist, hand and finger(s), initial encounter: Secondary | ICD-10-CM | POA: Diagnosis present

## 2014-11-03 MED ORDER — IBUPROFEN 800 MG PO TABS: 800.0000 mg | ORAL_TABLET | Freq: Three times a day (TID) | ORAL | Status: DC

## 2014-11-03 NOTE — Discharge Instructions (Signed)
Cryotherapy °Cryotherapy means treatment with cold. Ice or gel packs can be used to reduce both pain and swelling. Ice is the most helpful within the first 24 to 48 hours after an injury or flare-up from overusing a muscle or joint. Sprains, strains, spasms, burning pain, shooting pain, and aches can all be eased with ice. Ice can also be used when recovering from surgery. Ice is effective, has very few side effects, and is safe for most people to use. °PRECAUTIONS  °Ice is not a safe treatment option for people with: °· Raynaud phenomenon. This is a condition affecting small blood vessels in the extremities. Exposure to cold may cause your problems to return. °· Cold hypersensitivity. There are many forms of cold hypersensitivity, including: °¨ Cold urticaria. Red, itchy hives appear on the skin when the tissues begin to warm after being iced. °¨ Cold erythema. This is a red, itchy rash caused by exposure to cold. °¨ Cold hemoglobinuria. Red blood cells break down when the tissues begin to warm after being iced. The hemoglobin that carry oxygen are passed into the urine because they cannot combine with blood proteins fast enough. °· Numbness or altered sensitivity in the area being iced. °If you have any of the following conditions, do not use ice until you have discussed cryotherapy with your caregiver: °· Heart conditions, such as arrhythmia, angina, or chronic heart disease. °· High blood pressure. °· Healing wounds or open skin in the area being iced. °· Current infections. °· Rheumatoid arthritis. °· Poor circulation. °· Diabetes. °Ice slows the blood flow in the region it is applied. This is beneficial when trying to stop inflamed tissues from spreading irritating chemicals to surrounding tissues. However, if you expose your skin to cold temperatures for too long or without the proper protection, you can damage your skin or nerves. Watch for signs of skin damage due to cold. °HOME CARE INSTRUCTIONS °Follow  these tips to use ice and cold packs safely. °· Place a dry or damp towel between the ice and skin. A damp towel will cool the skin more quickly, so you may need to shorten the time that the ice is used. °· For a more rapid response, add gentle compression to the ice. °· Ice for no more than 10 to 20 minutes at a time. The bonier the area you are icing, the less time it will take to get the benefits of ice. °· Check your skin after 5 minutes to make sure there are no signs of a poor response to cold or skin damage. °· Rest 20 minutes or more between uses. °· Once your skin is numb, you can end your treatment. You can test numbness by very lightly touching your skin. The touch should be so light that you do not see the skin dimple from the pressure of your fingertip. When using ice, most people will feel these normal sensations in this order: cold, burning, aching, and numbness. °· Do not use ice on someone who cannot communicate their responses to pain, such as small children or people with dementia. °HOW TO MAKE AN ICE PACK °Ice packs are the most common way to use ice therapy. Other methods include ice massage, ice baths, and cryosprays. Muscle creams that cause a cold, tingly feeling do not offer the same benefits that ice offers and should not be used as a substitute unless recommended by your caregiver. °To make an ice pack, do one of the following: °· Place crushed ice or a   bag of frozen vegetables in a sealable plastic bag. Squeeze out the excess air. Place this bag inside another plastic bag. Slide the bag into a pillowcase or place a damp towel between your skin and the bag.  Mix 3 parts water with 1 part rubbing alcohol. Freeze the mixture in a sealable plastic bag. When you remove the mixture from the freezer, it will be slushy. Squeeze out the excess air. Place this bag inside another plastic bag. Slide the bag into a pillowcase or place a damp towel between your skin and the bag. SEEK MEDICAL CARE  IF:  You develop white spots on your skin. This may give the skin a blotchy (mottled) appearance.  Your skin turns blue or pale.  Your skin becomes waxy or hard.  Your swelling gets worse. MAKE SURE YOU:   Understand these instructions.  Will watch your condition.  Will get help right away if you are not doing well or get worse. Document Released: 10/10/2010 Document Revised: 06/30/2013 Document Reviewed: 10/10/2010 [facility] Patient Information 2015 [place], [place]. This information is not intended to replace advice given to you by your health care provider. Make sure you discuss any questions you have with your health care provider.  Contusion A contusion is a deep bruise. Contusions are the result of an injury that caused bleeding under the skin. The contusion may turn blue, purple, or yellow. Minor injuries will give you a painless contusion, but more severe contusions may stay painful and swollen for a few weeks.  CAUSES  A contusion is usually caused by a blow, trauma, or direct force to an area of the body. SYMPTOMS   Swelling and redness of the injured area.  Bruising of the injured area.  Tenderness and soreness of the injured area.  Pain. DIAGNOSIS  The diagnosis can be made by taking a history and physical exam. An X-ray, CT scan, or MRI may be needed to determine if there were any associated injuries, such as fractures. TREATMENT  Specific treatment will depend on what area of the body was injured. In general, the best treatment for a contusion is resting, icing, elevating, and applying cold compresses to the injured area. Over-the-counter medicines may also be recommended for pain control. Ask your caregiver what the best treatment is for your contusion. HOME CARE INSTRUCTIONS   Put ice on the injured area.  Put ice in a plastic bag.  Place a towel between your skin and the bag.  Leave the ice on for 15-20 minutes, 3-4 times a day, or as directed by your health  care provider.  Only take over-the-counter or prescription medicines for pain, discomfort, or fever as directed by your caregiver. Your caregiver may recommend avoiding anti-inflammatory medicines (aspirin, ibuprofen, and naproxen) for 48 hours because these medicines may increase bruising.  Rest the injured area.  If possible, elevate the injured area to reduce swelling. SEEK IMMEDIATE MEDICAL CARE IF:   You have increased bruising or swelling.  You have pain that is getting worse.  Your swelling or pain is not relieved with medicines. MAKE SURE YOU:   Understand these instructions.  Will watch your condition.  Will get help right away if you are not doing well or get worse. Document Released: 11/23/2004 Document Revised: 02/18/2013 Document Reviewed: 12/19/2010 [facility] Patient Information 2015 [place], [place]. This information is not intended to replace advice given to you by your health care provider. Make sure you discuss any questions you have with your health care provider. Wrist  Pain Wrist injuries are frequent in adults and children. A sprain is an injury to the ligaments that hold your bones together. A strain is an injury to muscle or muscle cord-like structures (tendons) from stretching or pulling. Generally, when wrists are moderately tender to touch following a fall or injury, a break in the bone (fracture) may be present. Most wrist sprains or strains are better in 3 to 5 days, but complete healing may take several weeks. HOME CARE INSTRUCTIONS   Put ice on the injured area.  Put ice in a plastic bag.  Place a towel between your skin and the bag.  Leave the ice on for 15-20 minutes, 3-4 times a day, for the first 2 days, or as directed by your health care provider.  Keep your arm raised above the level of your heart whenever possible to reduce swelling and pain.  Rest the injured area for at least 48 hours or as directed by your health care provider.  If a splint  or elastic bandage has been applied, use it for as long as directed by your health care provider or until seen by a health care provider for a follow-up exam.  Only take over-the-counter or prescription medicines for pain, discomfort, or fever as directed by your health care provider.  Keep all follow-up appointments. You may need to follow up with a specialist or have follow-up X-rays. Improvement in pain level is not a guarantee that you did not fracture a bone in your wrist. The only way to determine whether or not you have a broken bone is by X-ray. SEEK IMMEDIATE MEDICAL CARE IF:   Your fingers are swollen, very red, white, or cold and blue.  Your fingers are numb or tingling.  You have increasing pain.  You have difficulty moving your fingers. MAKE SURE YOU:   Understand these instructions.  Will watch your condition.  Will get help right away if you are not doing well or get worse. Document Released: 11/23/2004 Document Revised: 02/18/2013 Document Reviewed: 04/06/2010 [facility] Patient Information 2015 [place], [place]. This information is not intended to replace advice given to you by your health care provider. Make sure you discuss any questions you have with your health care provider.

## 2014-11-03 NOTE — ED Notes (Signed)
Pt states that she fell yesterday and today and now has R wrist pain and R knee pain. States she slipped. Alert and oriented.

## 2014-11-03 NOTE — ED Provider Notes (Signed)
CSN: [id number]     Arrival date & time 11/03/14  2125 History  This chart was scribed for Elpidio Anis, PA-C working with Benjiman Core, MD by Evon Slack, ED Scribe. This patient was seen in room WTR8/WTR8 and the patient's care was started at 9:52 PM.    Chief Complaint  Patient presents with  . Fall   The history is provided by the patient. No language interpreter was used.   HPI Comments: Shabreka Daijah Scrivens is a 49 y.o. female who presents to the Emergency Department complaining of fall onset 1 day prior. Pt states that she fell again today and have re aggravated her symptoms. Pt states that she slipped due to the floor being wet. Pt is complaining of right wrist, right knee pain and slight right ankle pain. Pt states she fell onto her right knee and extended her right arm out to try and catch her self. Pt denies head injury or LOC. Pt denies back pain, neck pain or gait problem   Past Medical History  Diagnosis Date  . Depression   . Gall stones   . Chronic headaches   . Anxiety    Past Surgical History  Procedure Laterality Date  . Dilation and curettage of uterus    . Tonsillectomy    . Ercp  06/08/2011    Procedure: ENDOSCOPIC RETROGRADE CHOLANGIOPANCREATOGRAPHY (ERCP);  Surgeon: Theda Belfast, MD;  Location: [facility] ENDOSCOPY;  Service: Endoscopy;  Laterality: N/A;  . Cholecystectomy  06/09/2011    Procedure: LAPAROSCOPIC CHOLECYSTECTOMY;  Surgeon: Liz Malady, MD;  Location: [facility] OR;  Service: General;  Laterality: N/A;   Family History  Problem Relation Age of Onset  . Diabetes Mother   . Heart disease Mother     AMI  . Heart failure Father   . Heart disease Father     CHF with defibrillator.  . Hypertension Sister    Social History  Substance Use Topics  . Smoking status: Never Smoker   . Smokeless tobacco: Never Used  . Alcohol Use: Yes     Comment: occasionally   OB History    No data available     Review of Systems  Musculoskeletal: Positive for  arthralgias. Negative for back pain and neck pain.  Neurological: Negative for syncope.  All other systems reviewed and are negative.    Allergies  Review of patient's allergies indicates no known allergies.  Home Medications   Prior to Admission medications   Medication Sig Start Date End Date Taking? Authorizing Provider  LORazepam (ATIVAN) 0.5 MG tablet Take 1 tablet (0.5 mg total) by mouth 2 (two) times daily as needed for anxiety. 07/16/14  Yes Sherren Mocha, MD  magnesium hydroxide (MILK OF MAGNESIA) 400 MG/5ML suspension Take 30 mLs by mouth daily as needed for mild constipation.   Yes Historical Provider, MD  Naftifine HCl (NAFTIN) 2 % GEL Apply 1 application topically daily. Patient taking differently: Apply 1 application topically daily as needed (for foot fungus).  07/17/14  Yes Vivi Barrack, DPM  polyethylene glycol powder (GLYCOLAX/MIRALAX) powder Take 5 capfuls of Miralax in 32 ounces of fluids. Patient taking differently: Take 17 g by mouth daily as needed (for constipation).  07/16/14  Yes Sherren Mocha, MD  meloxicam (MOBIC) 7.5 MG tablet Take 1 tablet (7.5 mg total) by mouth daily. Patient not taking: Reported on 11/03/2014 07/16/14   Sherren Mocha, MD  methylPREDNISolone (MEDROL DOSEPAK) 4 MG TBPK tablet Take as directed Patient not  taking: Reported on 11/03/2014 09/28/14   Vivi Barrack, DPM  ranitidine (ZANTAC) 150 MG tablet Take 1 tablet (150 mg total) by mouth 2 (two) times daily. Patient not taking: Reported on 11/03/2014 07/16/14   Sherren Mocha, MD  sertraline (ZOLOFT) 25 MG tablet Take 1 tablet (25 mg total) by mouth daily. Then increase to 2 tabs in 1 week and increase to 3 tabs 1-2 weeks after that. Patient not taking: Reported on 11/03/2014 07/16/14   Sherren Mocha, MD   BP 120/77 mmHg  Pulse 92  Temp(Src) 98.4 F (36.9 C) (Oral)  Resp 18  SpO2 99%   Physical Exam  Constitutional: She is oriented to person, place, and time. She appears well-developed and well-nourished. No  distress.  HENT:  Head: Normocephalic and atraumatic.  Eyes: Conjunctivae and EOM are normal.  Neck: Neck supple. No tracheal deviation present.  Cardiovascular: Normal rate.   Pulmonary/Chest: Effort normal. No respiratory distress.  Musculoskeletal: Normal range of motion. She exhibits tenderness.  right ankle minimal swelling lateral,  No discoloration, joint stable.   Right knee has no swelling, minimal tender, no discoloration, joint stable, ambulatory with full weight bearing  right wrist moderate swelling of wrist and hand, no discoloration or bony deformity, preserved fine motor hand skill and wrist flexion and extension     Neurological: She is alert and oriented to person, place, and time.  Skin: Skin is warm and dry.  Psychiatric: She has a normal mood and affect. Her behavior is normal.  Nursing note and vitals reviewed.   ED Course  Procedures (including critical care time) DIAGNOSTIC STUDIES: Oxygen Saturation is 99% on RA, normal by my interpretation.    COORDINATION OF CARE: 10:41 PM-Discussed treatment plan with pt at bedside and pt agreed to plan.     Labs Review Labs Reviewed - No data to display  Imaging Review No results found. I have personally reviewed and evaluated these images and lab results as part of my medical decision-making.   EKG Interpretation None      MDM   Final diagnoses:  None   1. Fall 2. Right knee contusion 3. Right wrist sprain  Non-fracture injuries. UDS performed for work related injury. Wrist splint provided. Orthopedic referral given if worsening symptoms.  I personally performed the services described in this documentation, which was scribed in my presence. The recorded information has been reviewed and is accurate.        Elpidio Anis, PA-C 11/04/14 [id number]  Benjiman Core, MD 11/04/14 1515

## 2014-12-02 ENCOUNTER — Ambulatory Visit: Payer: BLUE CROSS/BLUE SHIELD | Admitting: *Deleted

## 2014-12-02 DIAGNOSIS — M722 Plantar fascial fibromatosis: Secondary | ICD-10-CM

## 2014-12-02 NOTE — Patient Instructions (Signed)

## 2014-12-03 NOTE — Progress Notes (Signed)
Patient ID: [name], female   DOB: 02/12/1966, 49 y.o.   MRN: [id number] Patient presents for orthotic pick up.  Verbal and written break in and wear instructions given.  Patient will follow up in 4 weeks if symptoms worsen or fail to improve.

## 2015-01-11 ENCOUNTER — Ambulatory Visit (INDEPENDENT_AMBULATORY_CARE_PROVIDER_SITE_OTHER): Payer: BLUE CROSS/BLUE SHIELD | Admitting: Podiatry

## 2015-01-11 ENCOUNTER — Encounter: Payer: Self-pay | Admitting: Podiatry

## 2015-01-11 VITALS — BP 108/68 | HR 18 | Resp 89

## 2015-01-11 DIAGNOSIS — M722 Plantar fascial fibromatosis: Secondary | ICD-10-CM

## 2015-01-12 ENCOUNTER — Telehealth: Payer: Self-pay | Admitting: *Deleted

## 2015-01-12 NOTE — Progress Notes (Signed)
Patient ID: [name], female   DOB: 12/20/1965, 49 y.o.   MRN: [id number]  Subjective: [name] presents to the office today for follow-up evaluation of bilateral heel pain. She states that she is continuing a pain in the right heel and somewhat the arch of the foot. The left foot also hurt but not as symptomatic as the right side. She is continue stretching, icing, shoe changes, orthotics with minimal relief of symptoms. She denies any numbness or tingling. Pain does not wake her up at night. No other complaints at this time. Denies any claudication symptoms.  No other complaints at this time. No acute changes since last appointment. They deny any systemic complaints such as fevers, chills, nausea, vomiting.  Objective: General: AAO x3, NAD  Dermatological: Skin is warm, dry and supple bilateral. Nails x 10 are well manicured; remaining integument appears unremarkable at this time. There are no open sores, no preulcerative lesions, no rash or signs of infection present.  Vascular: Dorsalis Pedis artery and Posterior Tibial artery pedal pulses are 2/4 bilateral with immedate capillary fill time. Pedal hair growth present. There is no pain with calf compression, swelling, warmth, erythema.   Neruologic: Grossly intact via light touch bilateral. Vibratory intact via tuning fork bilateral. Protective threshold with Semmes Wienstein monofilament intact to all pedal sites bilateral.   Musculoskeletal: There is continued tenderness palpation along the plantar medial tubercle of the calcaneus at the insertion of the plantar fascia on the  Right > left foot. There is  Mild  pain along the course of the plantar fascia within the arch of the foot on the right side. Plantar fascia appears to be intact bilaterally. There is no pain with lateral compression of the calcaneus and there is no pain with vibratory sensation. There is no pain along the course or insertion of the Achilles tendon.  There are no other areas of tenderness to bilateral lower extremities. No gross boney pedal deformities bilateral. No pain, crepitus, or limitation noted with foot and ankle range of motion bilateral. Muscular strength 5/5 in all groups tested bilateral.  Gait: Unassisted, Nonantalgic.   Assessment: Presents for follow-up evaluation for heel pain, likely plantar fasciitis   Plan: -Treatment options discussed including all alternatives, risks, and complications -Patient elects to proceed with steroid injection into the right heel. Under sterile skin preparation, a total of 2.5cc of kenalog 10, 0.5% Marcaine plain, and 2% lidocaine plain were infiltrated into the symptomatic area without complication. A band-aid was applied. Patient tolerated the injection well without complication. Post-injection care with discussed with the patient. Discussed with the patient to ice the area over the next couple of days to help prevent a steroid flare.  -At this time recommended custom orthotics however she will hold off due to cost. -Discussed physical therapy for which she would like to try. Prescription was given. I discussed EPAT however she wishes to hold off -Ice and stretching exercises on a daily basis. -Continue supportive shoe gear. -Follow-up after PT or sooner if any problems arise. In the meantime, encouraged to call the office with any questions, concerns, change in symptoms.   Ovid CurdMatthew Wagoner, DPM

## 2015-01-12 NOTE — Telephone Encounter (Signed)
Pt states Dr. Ardelle AntonWagoner said that a walking boot would be best therapy for her at this time, and she had wanted to check if work would allow.  Pt states work will allow the boot and she would like to come by and get one.  Left message informing pt she would need to call to make sure someone was here that could fit her properly.

## 2015-02-16 ENCOUNTER — Ambulatory Visit (INDEPENDENT_AMBULATORY_CARE_PROVIDER_SITE_OTHER): Payer: BLUE CROSS/BLUE SHIELD | Admitting: Podiatry

## 2015-02-16 ENCOUNTER — Encounter: Payer: Self-pay | Admitting: Podiatry

## 2015-02-16 VITALS — BP 106/71 | HR 100 | Resp 16

## 2015-02-16 DIAGNOSIS — M722 Plantar fascial fibromatosis: Secondary | ICD-10-CM | POA: Diagnosis not present

## 2015-02-16 MED ORDER — DICLOFENAC SODIUM 75 MG PO TBEC: 75.0000 mg | DELAYED_RELEASE_TABLET | Freq: Two times a day (BID) | ORAL | Status: DC

## 2015-02-18 NOTE — Progress Notes (Signed)
Patient ID: [name], female   DOB: 07/02/1965, 49 y.o.   MRN: [id number]  Subjective: [name] presents to the office today for follow-up evaluation of bilateral heel pain.  She states that she continues to have pain to her heels and the right side worse than the left. The majority the pain is a throbbing sensation of the bottom of the heel she complications and gets up to the arch of the foot. She does not want to pursue physical therapy and she is requesting a cam boot at this time. She does stretch and ice intermittently. She has tried changing her shoes. No other complaints at this time. Denies any numbness or tingling or any swelling or redness. The pain does not wake her up at night.  No other complaints at this time. No acute changes since last appointment. They deny any systemic complaints such as fevers, chills, nausea, vomiting.  Objective: General: AAO x3, NAD  Dermatological: Skin is warm, dry and supple bilateral. Nails x 10 are well manicured; remaining integument appears unremarkable at this time.  Hyperkeratotic lesion right fifth toe. Upon debridement no underlying ulceration, drainage or other signs of infection.  Vascular: Dorsalis Pedis artery and Posterior Tibial artery pedal pulses are 2/4 bilateral with immedate capillary fill time. Pedal hair growth present. There is no pain with calf compression, swelling, warmth, erythema.   Neruologic: Grossly intact via light touch bilateral. Vibratory intact via tuning fork bilateral. Protective threshold with Semmes Wienstein monofilament intact to all pedal sites bilateral.   Musculoskeletal: There is continued tenderness palpation along the plantar medial tubercle of the calcaneus at the insertion of the plantar fascia on the  Right > left foot. There is mild pain along the course of the plantar fascia within the arch of the foot on the right side, however not on the left. Plantar fascia appears to be intact  bilaterally. There is no pain with lateral compression of the calcaneus and there is no pain with vibratory sensation. There is no pain along the course or insertion of the Achilles tendon. There are no other areas of tenderness to bilateral lower extremities. No gross boney pedal deformities bilateral. No pain, crepitus, or limitation noted with foot and ankle range of motion bilateral. Muscular strength 5/5 in all groups tested bilateral.  Gait: Unassisted, Nonantalgic.   Assessment: Presents for follow-up evaluation for heel pain, likely plantar fasciitis   Plan: -Treatment options discussed including all alternatives, risks, and complications - [name] she's had sterile certain injections and she has tried conservative treatment. She wishes to hold off off his therapy. She would to try CAM boot. I do not recommend this as she has pain is with her feet I'm concerned that by using Unna boot on one set Omega side worse. She understands this but even try. Dispensed CAM boot sedation. She'll start with ones on the right foot and the symptoms resolved. -Recommended custom orthotics however she will hold off due to cost. -Ice and stretching exercises on a daily basis. -Continue supportive shoe gear. -Follow-up  As scheduled or sooner if any problems arise. In the meantime, encouraged to call the office with any questions, concerns, change in symptoms.   Ovid CurdMatthew Erikka Follmer, DPM

## 2015-03-15 ENCOUNTER — Ambulatory Visit: Payer: BLUE CROSS/BLUE SHIELD | Admitting: Podiatry

## 2015-04-22 ENCOUNTER — Ambulatory Visit (INDEPENDENT_AMBULATORY_CARE_PROVIDER_SITE_OTHER): Payer: BLUE CROSS/BLUE SHIELD | Admitting: Podiatry

## 2015-04-22 ENCOUNTER — Ambulatory Visit (INDEPENDENT_AMBULATORY_CARE_PROVIDER_SITE_OTHER): Payer: BLUE CROSS/BLUE SHIELD

## 2015-04-22 ENCOUNTER — Encounter: Payer: Self-pay | Admitting: Podiatry

## 2015-04-22 VITALS — BP 110/80 | HR 84 | Resp 16

## 2015-04-22 DIAGNOSIS — M79671 Pain in right foot: Secondary | ICD-10-CM

## 2015-04-22 DIAGNOSIS — M722 Plantar fascial fibromatosis: Secondary | ICD-10-CM

## 2015-04-22 DIAGNOSIS — M79672 Pain in left foot: Secondary | ICD-10-CM | POA: Diagnosis not present

## 2015-04-22 NOTE — Patient Instructions (Addendum)
Plantar Fasciitis (Heel Spur Syndrome) with Rehab The plantar fascia is a fibrous, ligament-like, soft-tissue structure that spans the bottom of the foot. Plantar fasciitis is a condition that causes pain in the foot due to inflammation of the tissue. SYMPTOMS  1. Pain and tenderness on the underneath side of the foot. 2. Pain that worsens with standing or walking. CAUSES  Plantar fasciitis is caused by irritation and injury to the plantar fascia on the underneath side of the foot. Common mechanisms of injury include:  Direct trauma to bottom of the foot.  Damage to a small nerve that runs under the foot where the main fascia attaches to the heel bone.  Stress placed on the plantar fascia due to bone spurs. RISK INCREASES WITH:   Activities that place stress on the plantar fascia (running, jumping, pivoting, or cutting).  Poor strength and flexibility.  Improperly fitted shoes.  Tight calf muscles.  Flat feet.  Failure to warm-up properly before activity.  Obesity. PREVENTION  Warm up and stretch properly before activity.  Allow for adequate recovery between workouts.  Maintain physical fitness:  Strength, flexibility, and endurance.  Cardiovascular fitness.  Maintain a health body weight.  Avoid stress on the plantar fascia.  Wear properly fitted shoes, including arch supports for individuals who have flat feet.  PROGNOSIS  If treated properly, then the symptoms of plantar fasciitis usually resolve without surgery. However, occasionally surgery is necessary.  RELATED COMPLICATIONS   Recurrent symptoms that may result in a chronic condition.  Problems of the lower back that are caused by compensating for the injury, such as limping.  Pain or weakness of the foot during push-off following surgery.  Chronic inflammation, scarring, and partial or complete fascia tear, occurring more often from repeated injections.  TREATMENT  Treatment initially involves the  use of ice and medication to help reduce pain and inflammation. The use of strengthening and stretching exercises may help reduce pain with activity, especially stretches of the Achilles tendon. These exercises may be performed at home or with a therapist. Your caregiver may recommend that you use heel cups of arch supports to help reduce stress on the plantar fascia. Occasionally, corticosteroid injections are given to reduce inflammation. If symptoms persist for greater than 6 months despite non-surgical (conservative), then surgery may be recommended.   MEDICATION   If pain medication is necessary, then nonsteroidal anti-inflammatory medications, such as aspirin and ibuprofen, or other minor pain relievers, such as acetaminophen, are often recommended.  Do not take pain medication within 7 days before surgery.  Prescription pain relievers may be given if deemed necessary by your caregiver. Use only as directed and only as much as you need.  Corticosteroid injections may be given by your caregiver. These injections should be reserved for the most serious cases, because they may only be given a certain number of times.  HEAT AND COLD  Cold treatment (icing) relieves pain and reduces inflammation. Cold treatment should be applied for 10 to 15 minutes every 2 to 3 hours for inflammation and pain and immediately after any activity that aggravates your symptoms. Use ice packs or massage the area with a piece of ice (ice massage).  Heat treatment may be used prior to performing the stretching and strengthening activities prescribed by your caregiver, physical therapist, or athletic trainer. Use a heat pack or soak the injury in warm water.  SEEK IMMEDIATE MEDICAL CARE IF:  Treatment seems to offer no benefit, or the condition worsens.  Any medications  produce adverse side effects.  EXERCISES- RANGE OF MOTION (ROM) AND STRETCHING EXERCISES - Plantar Fasciitis (Heel Spur Syndrome) These exercises  may help you when beginning to rehabilitate your injury. Your symptoms may resolve with or without further involvement from your physician, physical therapist or athletic trainer. While completing these exercises, remember:   Restoring tissue flexibility helps normal motion to return to the joints. This allows healthier, less painful movement and activity.  An effective stretch should be held for at least 30 seconds.  A stretch should never be painful. You should only feel a gentle lengthening or release in the stretched tissue.  RANGE OF MOTION - Toe Extension, Flexion  Sit with your right / left leg crossed over your opposite knee.  Grasp your toes and gently pull them back toward the top of your foot. You should feel a stretch on the bottom of your toes and/or foot.  Hold this stretch for 10 seconds.  Now, gently pull your toes toward the bottom of your foot. You should feel a stretch on the top of your toes and or foot.  Hold this stretch for 10 seconds. Repeat  times. Complete this stretch 3 times per day.   RANGE OF MOTION - Ankle Dorsiflexion, Active Assisted  Remove shoes and sit on a chair that is preferably not on a carpeted surface.  Place right / left foot under knee. Extend your opposite leg for support.  Keeping your heel down, slide your right / left foot back toward the chair until you feel a stretch at your ankle or calf. If you do not feel a stretch, slide your bottom forward to the edge of the chair, while still keeping your heel down.  Hold this stretch for 10 seconds. Repeat 3 times. Complete this stretch 2 times per day.   STRETCH  Gastroc, Standing  Place hands on wall.  Extend right / left leg, keeping the front knee somewhat bent.  Slightly point your toes inward on your back foot.  Keeping your right / left heel on the floor and your knee straight, shift your weight toward the wall, not allowing your back to arch.  You should feel a gentle stretch  in the right / left calf. Hold this position for 10 seconds. Repeat 3 times. Complete this stretch 2 times per day.  STRETCH  Soleus, Standing  Place hands on wall.  Extend right / left leg, keeping the other knee somewhat bent.  Slightly point your toes inward on your back foot.  Keep your right / left heel on the floor, bend your back knee, and slightly shift your weight over the back leg so that you feel a gentle stretch deep in your back calf.  Hold this position for 10 seconds. Repeat 3 times. Complete this stretch 2 times per day.  STRETCH  Gastrocsoleus, Standing  Note: This exercise can place a lot of stress on your foot and ankle. Please complete this exercise only if specifically instructed by your caregiver.   Place the ball of your right / left foot on a step, keeping your other foot firmly on the same step.  Hold on to the wall or a rail for balance.  Slowly lift your other foot, allowing your body weight to press your heel down over the edge of the step.  You should feel a stretch in your right / left calf.  Hold this position for 10 seconds.  Repeat this exercise with a slight bend in your right /   left knee. Repeat 3 times. Complete this stretch 2 times per day.   STRENGTHENING EXERCISES - Plantar Fasciitis (Heel Spur Syndrome)  These exercises may help you when beginning to rehabilitate your injury. They may resolve your symptoms with or without further involvement from your physician, physical therapist or athletic trainer. While completing these exercises, remember:   Muscles can gain both the endurance and the strength needed for everyday activities through controlled exercises.  Complete these exercises as instructed by your physician, physical therapist or athletic trainer. Progress the resistance and repetitions only as guided.  STRENGTH - Towel Curls  Sit in a chair positioned on a non-carpeted surface.  Place your foot on a towel, keeping your heel  on the floor.  Pull the towel toward your heel by only curling your toes. Keep your heel on the floor. Repeat 3 times. Complete this exercise 2 times per day.  STRENGTH - Ankle Inversion  Secure one end of a rubber exercise band/tubing to a fixed object (table, pole). Loop the other end around your foot just before your toes.  Place your fists between your knees. This will focus your strengthening at your ankle.  Slowly, pull your big toe up and in, making sure the band/tubing is positioned to resist the entire motion.  Hold this position for 10 seconds.  Have your muscles resist the band/tubing as it slowly pulls your foot back to the starting position. Repeat 3 times. Complete this exercises 2 times per day.  Document Released: 02/13/2005 Document Revised: 05/08/2011 Document Reviewed: 05/28/2008 [employer] Patient Information 2014 [employer]. Pre-Operative Instructions  Congratulations, you have decided to take an important step to improving your quality of life.  You can be assured that the doctors of [facility] will be with you every step of the way.  3. Plan to be at the surgery center/hospital at least 1 (one) hour prior to your scheduled time unless otherwise directed by the surgical center/hospital staff.  You must have a responsible adult accompany you, remain during the surgery and drive you home.  Make sure you have directions to the surgical center/hospital and know how to get there on time. 4. For hospital based surgery you will need to obtain a history and physical form from your family physician within 1 month prior to the date of surgery- we will give you a form for you primary physician.  5. We make every effort to accommodate the date you request for surgery.  There are however, times where surgery dates or times have to be moved.  We will contact you as soon as possible if a change in schedule is required.   6. No Aspirin/Ibuprofen for one week before surgery.   If you are on aspirin, any non-steroidal anti-inflammatory medications (Mobic, Aleve, Ibuprofen) you should stop taking it 7 days prior to your surgery.  You make take Tylenol  For pain prior to surgery.  7. Medications- If you are taking daily heart and blood pressure medications, seizure, reflux, allergy, asthma, anxiety, pain or diabetes medications, make sure the surgery center/hospital is aware before the day of surgery so they may notify you which medications to take or avoid the day of surgery. 8. No food or drink after midnight the night before surgery unless directed otherwise by surgical center/hospital staff. 9. No alcoholic beverages 24 hours prior to surgery.  No smoking 24 hours prior to or 24 hours after surgery. 10. Wear loose pants or shorts- loose enough to fit over bandages,   boots, and casts. 11. No slip on shoes, sneakers are best. 12. Bring your boot with you to the surgery center/hospital.  Also bring crutches or a walker if your physician has prescribed it for you.  If you do not have this equipment, it will be provided for you after surgery. 13. If you have not been contracted by the surgery center/hospital by the day before your surgery, call to confirm the date and time of your surgery. 14. Leave-time from work may vary depending on the type of surgery you have.  Appropriate arrangements should be made prior to surgery with your employer. 15. Prescriptions will be provided immediately following surgery by your doctor.  Have these filled as soon as possible after surgery and take the medication as directed. 16. Remove nail polish on the operative foot. 17. Wash the night before surgery.  The night before surgery wash the foot and leg well with the antibacterial soap provided and water paying special attention to beneath the toenails and in between the toes.  Rinse thoroughly with water and dry well with a towel.  Perform this wash unless told not to do so by your  physician.  Enclosed: 1 Ice pack (please put in freezer the night before surgery)   1 Hibiclens skin cleaner   Pre-op Instructions  If you have any questions regarding the instructions, do not hesitate to call our office.  [place]: [address] , [place] [address] [phone]  [place]: [address] [place], [place] [address] [phone]  [place]: [address]  [place], [place] [address] [phone]  Dr. Richard Tuchman DPM, Dr. Norman Regal DPM Dr. Richard Sikora DPM, Dr. M. Todd Hyatt DPM, Dr. Kathryn Egerton DPM 

## 2015-04-25 NOTE — Progress Notes (Signed)
Subjective:     Patient ID: [name], female   DOB: [birth year], 50 y.o.   MRN: [id number]  HPI patient presents stating the pain is simply not getting better and it is making it impossible for me to be comfortable. Patient states that she's tried numerous conservative treatments including boot therapy orthotics anti-inflammatory injections without relief and she's having trouble working   Review of Systems     Objective:   Physical Exam Neurovascular status found to be intact negative Homans sign noted severe discomfort medial fascial band left over right with inflammation and fluid around the medial band and inability to walk on the side of the heel    Assessment:     Severe chronic plantar fasciitis that's been present for over a year and has failed to respond to numerous conservative treatments    Plan:     Reviewed condition at great length and options and she states that she is tired of pain she wants to attempt surgical intervention. I did explain there is no guarantee as far as success of surgery and reviewed all alternative treatments and complications associated with this procedure and after extensive review she understands and signs consent form. She understands she'll be walking boot a minimum of 4 weeks and that total recovery can take from 6 months to one year. She is given all preoperative instructions scheduled for surgery in is encouraged to call with any questions prior to procedure

## 2015-05-07 ENCOUNTER — Encounter: Payer: Self-pay | Admitting: Physician Assistant

## 2015-05-07 ENCOUNTER — Ambulatory Visit (INDEPENDENT_AMBULATORY_CARE_PROVIDER_SITE_OTHER): Payer: BLUE CROSS/BLUE SHIELD

## 2015-05-07 ENCOUNTER — Ambulatory Visit (INDEPENDENT_AMBULATORY_CARE_PROVIDER_SITE_OTHER): Payer: BLUE CROSS/BLUE SHIELD | Admitting: Physician Assistant

## 2015-05-07 VITALS — BP 124/78 | HR 96 | Temp 98.6°F | Resp 17 | Ht 63.5 in | Wt 176.0 lb

## 2015-05-07 DIAGNOSIS — M545 Low back pain, unspecified: Secondary | ICD-10-CM

## 2015-05-07 DIAGNOSIS — M5136 Other intervertebral disc degeneration, lumbar region: Secondary | ICD-10-CM | POA: Diagnosis not present

## 2015-05-07 DIAGNOSIS — M51369 Other intervertebral disc degeneration, lumbar region without mention of lumbar back pain or lower extremity pain: Secondary | ICD-10-CM | POA: Insufficient documentation

## 2015-05-07 DIAGNOSIS — M722 Plantar fascial fibromatosis: Secondary | ICD-10-CM | POA: Diagnosis not present

## 2015-05-07 MED ORDER — CYCLOBENZAPRINE HCL 10 MG PO TABS: 10.0000 mg | ORAL_TABLET | Freq: Every day | ORAL | Status: DC

## 2015-05-07 MED ORDER — HYDROCODONE-ACETAMINOPHEN 5-325 MG PO TABS: 1.0000 | ORAL_TABLET | Freq: Four times a day (QID) | ORAL | Status: DC | PRN

## 2015-05-07 MED ORDER — MELOXICAM 15 MG PO TABS
15.0000 mg | ORAL_TABLET | Freq: Every day | ORAL | Status: DC
Start: 2015-05-07 — End: 2015-08-19

## 2015-05-07 NOTE — Patient Instructions (Addendum)
Because you received an x-ray today, you will receive an invoice from [facility] Please contact [facility] at [phone] with questions or concerns regarding your invoice. Our billing staff will not be able to assist you with those questions.  Contact the surgeon to find out when/if you need to stop the meloxicam prior to the procedure.

## 2015-05-07 NOTE — Progress Notes (Signed)
Patient ID: [name], female    DOB: 06/13/1965, 50 y.o.   MRN: [id number]  PCP: No PCP Per Patient  Subjective:   Chief Complaint  Patient presents with  . Back Pain  . Foot Pain  . Depression    HPI Presents today with a complaint of lower back pain x6 years and foot pain x2 years.   Was diagnosed with plantar fascitis bilaterally 1 year ago, has been suffering for 2 years. Is being treated by [facility]. Is going to have surgery 21st of March by Dr. Charlsie Merlesegal. Hasn't contacted her foot doctor about this pain, but decided to come in.   Works two jobs and is on her feet for both. Pain slightly relieved with movement. 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   States her foot pain is a constant 8/10 pain, characterized burning/aching sensation. States ibuprofen, aleve "don't touch" the pain. Has used hydrocodone in the past which has helped relief the pain.   Patient reports that she hurt her back 6 years ago after rearranging furniture. Her back pain has gotten worse over the last 6 months. States the pain is constant ache with occasional shooting pains 8/10. Occasional numbness/tingling in lower back. Pain worse after being on feet all day and when she tries to stretch it. Denies any pain, numbness, tingling in her legs, or weakness.  Patient has had depression for years. Doesn't like taking medications because a lot of them because of the sexual side effects. States others are ineffective. Is currently seeing therapist at the [facility] every 3 weeks to talk.   Review of Systems Constitutional: Negative for fever, chills and fatigue.  HENT: Negative for hearing loss.  Eyes: Negative for visual disturbance.  Respiratory: Negative for cough, chest tightness and shortness of breath.  Cardiovascular: Negative for chest pain and leg swelling.  Endocrine:  Positive for polyuria.  Genitourinary: Negative for dysuria, flank pain and difficulty urinating.  Musculoskeletal: Positive for back pain (Bilateral lower) and gait problem. Negative for joint swelling, neck pain and neck stiffness.  Neurological: Negative for dizziness, light-headedness and headaches.  Psychiatric/Behavioral: Positive for dysphoric mood. Negative for suicidal ideas and sleep disturbance. The patient is nervous/anxious.     Patient Active Problem List   Diagnosis Date Noted  . Other mixed anxiety disorders 08/28/2014  . S/P laparoscopic cholecystectomy 07/05/2011  . BREAST PAIN 05/19/2009  . CERVICAL STRAIN 05/19/2009  . ALLERGIC RHINITIS 06/22/2008  . Migraine 06/22/2008  . DEPRESSION, MAJOR, MODERATE 06/15/2008  . PARESTHESIA 06/15/2008  . PULMONARY NODULE 04/17/2007  . COUGH 04/17/2007  . Chest pain, atypical 04/17/2007  . MENORRHAGIA 07/02/2006  . NOCTURIA 04/26/2006     Prior to Admission medications   Medication Sig Start Date End Date Taking? Authorizing Provider  ibuprofen (ADVIL,MOTRIN) 800 MG tablet Take by mouth.    Historical Provider, MD  naproxen sodium (ANAPROX) 220 MG tablet Take by mouth.    Historical Provider, MD  polyethylene glycol powder (GLYCOLAX/MIRALAX) powder  03/15/15   Historical Provider, MD     No Known Allergies     Objective:  Physical Exam  Constitutional: She is oriented to person, place, and time. She appears well-developed and well-nourished. She is active and cooperative. No distress.  BP 124/78 mmHg  Pulse 96  Temp(Src) 98.6 F (37 C) (Oral)  Resp 17  Ht 5' 3.5" (1.613 m)  Wt 176 lb (79.833 kg)  BMI 30.68 kg/m2  SpO2 98%  HENT:  Head: Normocephalic and atraumatic.  Right Ear: Hearing normal.  Left Ear: Hearing normal.  Eyes: Conjunctivae are normal. No scleral icterus.  Neck: Normal range of motion. Neck supple. No thyromegaly present.  Cardiovascular: Normal rate, regular rhythm and normal heart sounds.     Pulses:      Radial pulses are 2+ on the right side, and 2+ on the left side.  Pulmonary/Chest: Effort normal and breath sounds normal.  Musculoskeletal:       Thoracic back: Normal.       Lumbar back: She exhibits tenderness, bony tenderness and pain. She exhibits normal range of motion, no swelling, no edema, no deformity, no laceration, no spasm and normal pulse.       Right foot: There is tenderness. There is normal range of motion, no bony tenderness, no swelling and normal capillary refill.       Left foot: There is tenderness. There is normal range of motion, no bony tenderness, no swelling and normal capillary refill.  Lymphadenopathy:       Head (right side): No tonsillar, no preauricular, no posterior auricular and no occipital adenopathy present.       Head (left side): No tonsillar, no preauricular, no posterior auricular and no occipital adenopathy present.    She has no cervical adenopathy.       Right: No supraclavicular adenopathy present.       Left: No supraclavicular adenopathy present.  Neurological: She is alert and oriented to person, place, and time. No sensory deficit.  Skin: Skin is warm, dry and intact. No rash noted. No cyanosis or erythema. Nails show no clubbing.  Psychiatric: She has a normal mood and affect. Her speech is normal and behavior is normal.    Dg Lumbar Spine Complete  05/07/2015  CLINICAL DATA:  Midline low back pain without sciatica. EXAM: LUMBAR SPINE - COMPLETE 4+ VIEW COMPARISON:  None. FINDINGS: No fracture or spondylolisthesis is noted. Moderate degenerative disc disease is noted at L4-5. Remaining disc spaces appear intact. Posterior facet joints appear intact. IMPRESSION: Moderate degenerative disc disease is noted at L4-5. No acute abnormality seen in the lumbar spine. Electronically Signed   By: Lupita Raider, M.D.   On: 05/07/2015 11:54          Assessment & Plan:   1. Plantar fasciitis, bilateral Meloxicam. Proceed with plans  per podiatry. - meloxicam (MOBIC) 15 MG tablet; Take 1 tablet (15 mg total) by mouth daily.  Dispense: 30 tablet; Refill: 0  2. Midline low back pain without sciatica 3. Lumbar degenerative disc disease Likely exacerbated by altered gait due to plantar fasciitis. Meloxicam as above. Cyclobenzaprine + short course hydrocodone. - DG Lumbar Spine Complete; Future - HYDROcodone-acetaminophen (NORCO) 5-325 MG tablet; Take 1 tablet by mouth every 6 (six) hours as needed.  Dispense: 30 tablet; Refill: 0 - cyclobenzaprine (FLEXERIL) 10 MG tablet; Take 1 tablet (10 mg total) by mouth at bedtime.  Dispense: 30 tablet; Refill: 0    Fernande Bras, PA-C Physician Assistant-Certified [facility]

## 2015-05-07 NOTE — Progress Notes (Signed)
Subjective:    Patient ID: [name], female    DOB: May 22, 1965, 50 y.o.   MRN: [id number]  Chief Complaint  Patient presents with  . Back Pain  . Foot Pain  . Depression   HPI  Presents today with a complaint of lower back pain x6 years and foot pain x2 years.   Was diagnosed with plantar fascitis bilaterally 1 year ago, has been suffering for 2 years. Is being treated by [facility]. Is going to have surgery 21st of March by Dr. Charlsie Merles. Hasn't contacted her foot doctor about this pain, but decided to come in.   Works two jobs and is on her feet for both. Pain slightly relieved with movement. 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   States her foot pain is a constant 8/10 pain, characterized burning/aching sensation. States ibuprofen, aleve "don't touch" the pain. Has used hydrocodone in the past which has helped relief the pain.   Patient reports that she hurt her back 6 years ago after rearranging furniture. Her back pain has gotten worse over the last 6 months. States the pain is constant ache with occasional shooting pains 8/10. Occasional numbness/tingling in lower back. Pain worse after being on feet all day and when she tries to stretch it. Denies any pain, numbness, tingling in her legs, or weakness.  Patient has had depression for years. Doesn't like taking medications because a lot of them because of the sexual side effects. States others are ineffective. Is currently seeing therapist at the [facility] every 3 weeks to talk.   PMH, FH, and SH were all reviewed with patient and updated as needed.  No Known Allergies Prior to Admission medications   Not on File   Review of Systems  Constitutional: Negative for fever, chills and fatigue.  HENT: Negative for hearing loss.   Eyes: Negative for visual disturbance.  Respiratory: Negative for  cough, chest tightness and shortness of breath.   Cardiovascular: Negative for chest pain and leg swelling.  Endocrine: Positive for polyuria.  Genitourinary: Negative for dysuria, flank pain and difficulty urinating.  Musculoskeletal: Positive for back pain (Bilateral lower) and gait problem. Negative for joint swelling, neck pain and neck stiffness.  Neurological: Negative for dizziness, light-headedness and headaches.  Psychiatric/Behavioral: Positive for dysphoric mood. Negative for suicidal ideas and sleep disturbance. The patient is nervous/anxious.        Objective:   Physical Exam  Constitutional: She is oriented to person, place, and time. She appears well-developed and well-nourished.  Blood pressure 124/78, pulse 96, temperature 98.6 F (37 C), temperature source Oral, resp. rate 17, height 5' 3.5" (1.613 m), weight 176 lb (79.833 kg), SpO2 98 %.   HENT:  Head: Normocephalic and atraumatic.  Right Ear: External ear normal.  Left Ear: External ear normal.  Nose: Nose normal.  Mouth/Throat: Oropharynx is clear and moist. No oropharyngeal exudate.  Eyes: Conjunctivae and EOM are normal. Pupils are equal, round, and reactive to light.  Neck: Normal range of motion and full passive range of motion without pain. Neck supple.  Cardiovascular: Normal rate, regular rhythm, normal heart sounds and intact distal pulses.  Exam reveals no gallop and no friction rub.   No murmur heard. Pulses:      Radial pulses are 2+ on the right side.  Dorsalis pedis pulses are 2+ on the right side, and 2+ on the left side.       Posterior tibial pulses are 2+ on the right side, and 2+ on the left side.  Pulmonary/Chest: Effort normal and breath sounds normal. No respiratory distress. She has no wheezes. She has no rhonchi. She has no rales. She exhibits no tenderness.  Musculoskeletal:       Lumbar back: She exhibits bony tenderness and pain.       Back:       Right foot: There is tenderness.  There is normal range of motion, no swelling and normal capillary refill.       Left foot: There is tenderness. There is normal range of motion, no swelling and normal capillary refill.       Feet:  Lymphadenopathy:       Head (right side): No submental, no submandibular, no tonsillar, no preauricular, no posterior auricular and no occipital adenopathy present.       Head (left side): No submental, no submandibular, no tonsillar, no posterior auricular and no occipital adenopathy present.    She has no cervical adenopathy.       Right: No supraclavicular adenopathy present.       Left: No supraclavicular adenopathy present.  Neurological: She is alert and oriented to person, place, and time. She has normal strength. No sensory deficit.  Reflex Scores:      Bicep reflexes are 2+ on the right side and 2+ on the left side.      Patellar reflexes are 2+ on the right side and 2+ on the left side.      Achilles reflexes are 2+ on the right side. Skin: Skin is warm and dry. No erythema.  Psychiatric: Her speech is normal. Judgment and thought content normal. Her mood appears anxious. Cognition and memory are normal. She exhibits a depressed mood.   Dg Lumbar Spine Complete  05/07/2015  CLINICAL DATA:  Midline low back pain without sciatica. EXAM: LUMBAR SPINE - COMPLETE 4+ VIEW COMPARISON:  None. FINDINGS: No fracture or spondylolisthesis is noted. Moderate degenerative disc disease is noted at L4-5. Remaining disc spaces appear intact. Posterior facet joints appear intact. IMPRESSION: Moderate degenerative disc disease is noted at L4-5. No acute abnormality seen in the lumbar spine. Electronically Signed   By: Lupita RaiderJames  Green Jr, M.D.   On: 05/07/2015 11:54      Assessment & Plan:  1. Plantar fasciitis, bilateral - meloxicam (MOBIC) 15 MG tablet; Take 1 tablet (15 mg total) by mouth daily.  Dispense: 30 tablet; Refill: 0  2. Midline low back pain without sciatica - DG Lumbar Spine Complete;  Future - HYDROcodone-acetaminophen (NORCO) 5-325 MG tablet; Take 1 tablet by mouth every 6 (six) hours as needed.  Dispense: 30 tablet; Refill: 0 - cyclobenzaprine (FLEXERIL) 10 MG tablet; Take 1 tablet (10 mg total) by mouth at bedtime.  Dispense: 30 tablet; Refill: 0  3. Lumbar degenerative disc disease  Due to patient's significant complaint of pain will prescribe meloxicam to help with inflammation. Also flexeril to take at night to help relax back muscles. Will prescribe 10 days worth of norco. Discussed possible side effects including constipation. Provided work note. Encouraged to keep moving and not just bed rest as this can make pain worse.   Discussed assessment and plan with patient. They expressed their understanding and acceptance.   Hilton CorkS. Dani Taaliyah Delpriore PA-S [facility]

## 2015-05-18 ENCOUNTER — Encounter: Payer: Self-pay | Admitting: *Deleted

## 2015-05-18 DIAGNOSIS — M722 Plantar fascial fibromatosis: Secondary | ICD-10-CM | POA: Diagnosis not present

## 2015-05-24 ENCOUNTER — Telehealth: Payer: Self-pay | Admitting: *Deleted

## 2015-05-24 NOTE — Telephone Encounter (Addendum)
Debarah Crapelaudia [facility]- [employer] is calling for confirmation of pt's surgery date, procedure performed and estimated return to work date.  06/15/2015-Claud[place] - [employer] request update of pt's last office visit, and if she is to be out of work until next office visit 07/08/2015 and in the boot.

## 2015-05-27 ENCOUNTER — Ambulatory Visit (INDEPENDENT_AMBULATORY_CARE_PROVIDER_SITE_OTHER): Payer: BLUE CROSS/BLUE SHIELD | Admitting: Physician Assistant

## 2015-05-27 VITALS — BP 118/74 | HR 107 | Temp 98.0°F | Resp 18 | Ht 63.5 in | Wt 170.0 lb

## 2015-05-27 DIAGNOSIS — R110 Nausea: Secondary | ICD-10-CM | POA: Diagnosis not present

## 2015-05-27 DIAGNOSIS — R5382 Chronic fatigue, unspecified: Secondary | ICD-10-CM | POA: Diagnosis not present

## 2015-05-27 DIAGNOSIS — R309 Painful micturition, unspecified: Secondary | ICD-10-CM

## 2015-05-27 LAB — POCT URINALYSIS DIP (MANUAL ENTRY)
Bilirubin, UA: NEGATIVE
Glucose, UA: NEGATIVE
Ketones, POC UA: NEGATIVE
Nitrite, UA: NEGATIVE
PROTEIN UA: NEGATIVE
SPEC GRAV UA: 1.015
UROBILINOGEN UA: 0.2
pH, UA: 6

## 2015-05-27 LAB — POC MICROSCOPIC URINALYSIS (UMFC)

## 2015-05-27 MED ORDER — RANITIDINE HCL 150 MG PO TABS: 150.0000 mg | ORAL_TABLET | Freq: Two times a day (BID) | ORAL | Status: DC

## 2015-05-27 MED ORDER — NITROFURANTOIN MONOHYD MACRO 100 MG PO CAPS: 100.0000 mg | ORAL_CAPSULE | Freq: Two times a day (BID) | ORAL | Status: DC

## 2015-05-27 NOTE — Patient Instructions (Signed)
     IF you received an x-ray today, you will receive an invoice from [facility]. Please contact  Radiology at [phone] with questions or concerns regarding your invoice.   IF you received labwork today, you will receive an invoice from [facility]. Please contact [facility] at [phone] with questions or concerns regarding your invoice.   Our billing staff will not be able to assist you with questions regarding bills from these companies.  You will be contacted with the lab results as soon as they are available. The fastest way to get your results is to activate your My Chart account. Instructions are located on the last page of this paperwork. If you have not heard from us regarding the results in 2 weeks, please contact this office.      

## 2015-05-27 NOTE — Progress Notes (Signed)
Patient ID: [name], female    DOB: 03/05/1965, 50 y.o.   MRN: [id number]  PCP: No PCP Per Patient  Subjective:   Chief Complaint  Patient presents with  . Urinary Tract Infection    x1 week, burning sensation     HPI Presents for evaluation of possible UTI.  No hematuria. Increased urinary frequency. No urgency, but sometimes has low volume of urine. No atypical vaginal discharge. No new/different back pain. No abdominal pain. No fever/chills. Nausea, but this is a common symptom she has frequently, and is not worse.  Notes the nausea has been frequent x at least a year.  Occurs if she doesn't eat, and when she eats. Ginger ale seems to help a little. No certain foods seem worse than others. No nocturnal awakenings with nausea. Some heartburn, indigestion. No vomiting. Reports previously being told that she had a hiatal hernia.  Had surgery on the RIGHT foot for plantar fasciitis on 05/18/2015. Is not back to driving yet.  Also asks about having her thyroid checked. She has weight gain, constipation, dry skin, hair and nails. Would like to weight about 125 lbs. Has been as low as 97 lbs following her mother's death and separation from her first husband in 2009. Notes fatigue, depression. The past two days have been worse, and she reports crying for no reason, as she has been "stuck in the house." Visiting her infant neighbor cheers her right up.    Review of Systems As above.    Patient Active Problem List   Diagnosis Date Noted  . Lumbar degenerative disc disease 05/07/2015  . Other mixed anxiety disorders 08/28/2014  . S/P laparoscopic cholecystectomy 07/05/2011  . BREAST PAIN 05/19/2009  . CERVICAL STRAIN 05/19/2009  . ALLERGIC RHINITIS 06/22/2008  . Migraine 06/22/2008  . DEPRESSION, MAJOR, MODERATE 06/15/2008  . PARESTHESIA 06/15/2008  . PULMONARY NODULE 04/17/2007  . COUGH 04/17/2007  . Chest pain, atypical 04/17/2007  . MENORRHAGIA  07/02/2006  . NOCTURIA 04/26/2006     Prior to Admission medications   Medication Sig Start Date End Date Taking? Authorizing Provider  cyclobenzaprine (FLEXERIL) 10 MG tablet Take 1 tablet (10 mg total) by mouth at bedtime. 05/07/15  Yes Joneric Streight, PA-C  HYDROcodone-acetaminophen (NORCO) 5-325 MG tablet Take 1 tablet by mouth every 6 (six) hours as needed. 05/07/15  Yes Lamarr Feenstra, PA-C  ibuprofen (ADVIL,MOTRIN) 800 MG tablet Take by mouth.   Yes Historical Provider, MD  meloxicam (MOBIC) 15 MG tablet Take 1 tablet (15 mg total) by mouth daily. 05/07/15  Yes Daijah Scrivens, PA-C  naproxen sodium (ANAPROX) 220 MG tablet Take by mouth.   Yes Historical Provider, MD  polyethylene glycol powder (GLYCOLAX/MIRALAX) powder  03/15/15  Yes Historical Provider, MD     No Known Allergies     Objective:  Physical Exam  Constitutional: She is oriented to person, place, and time. She appears well-developed and well-nourished. She is active and cooperative. No distress.  BP 118/74 mmHg  Pulse 107  Temp(Src) 98 F (36.7 C) (Oral)  Resp 18  Ht 5' 3.5" (1.613 m)  Wt 170 lb (77.111 kg)  BMI 29.64 kg/m2  SpO2 99%  LMP   HENT:  Head: Normocephalic and atraumatic.  Right Ear: Hearing normal.  Left Ear: Hearing normal.  Eyes: Conjunctivae are normal. No scleral icterus.  Neck: Normal range of motion, full passive range of motion without pain and phonation normal. Neck supple. Thyromegaly (mildly) present. No thyroid mass present.  Cardiovascular:  Normal rate, regular rhythm and normal heart sounds.   Pulses:      Radial pulses are 2+ on the right side, and 2+ on the left side.  Pulmonary/Chest: Effort normal and breath sounds normal.  Abdominal: Soft. Normal appearance and bowel sounds are normal. There is no hepatosplenomegaly. There is tenderness in the suprapubic area. There is no CVA tenderness.  Lymphadenopathy:       Head (right side): No tonsillar, no preauricular, no posterior  auricular and no occipital adenopathy present.       Head (left side): No tonsillar, no preauricular, no posterior auricular and no occipital adenopathy present.    She has no cervical adenopathy.       Right: No supraclavicular adenopathy present.       Left: No supraclavicular adenopathy present.  Neurological: She is alert and oriented to person, place, and time. No sensory deficit.  Skin: Skin is warm, dry and intact. No rash noted. No cyanosis or erythema. Nails show no clubbing.  Psychiatric: She has a normal mood and affect. Her speech is normal and behavior is normal.    Results for orders placed or performed in visit on 05/27/15  POCT urinalysis dipstick  Result Value Ref Range   Color, UA yellow yellow   Clarity, UA cloudy (A) clear   Glucose, UA negative negative   Bilirubin, UA negative negative   Ketones, POC UA negative negative   Spec Grav, UA 1.015    Blood, UA moderate (A) negative   pH, UA 6.0    Protein Ur, POC negative negative   Urobilinogen, UA 0.2    Nitrite, UA Negative Negative   Leukocytes, UA small (1+) (A) Negative  POCT Microscopic Urinalysis ([facility])  Result Value Ref Range   WBC,UR,HPF,POC Too numerous to count  (A) None WBC/hpf   RBC,UR,HPF,POC Too numerous to count  (A) None RBC/hpf   Bacteria Moderate (A) None, Too numerous to count   Mucus Present (A) Absent   Epithelial Cells, UR Per Microscopy Few (A) None, Too numerous to count cells/hpf          Assessment & Plan:   1. Painful urination Likely UTI despite lack of nitrates on microscopy. Empiric therapy with nitrofurantoin. Await UCx. - POCT urinalysis dipstick - POCT Microscopic Urinalysis ([facility]) - Urine culture - nitrofurantoin, macrocrystal-monohydrate, (MACROBID) 100 MG capsule; Take 1 capsule (100 mg total) by mouth 2 (two) times daily.  Dispense: 20 capsule; Refill: 0  2. Chronic fatigue Await lab results.  - Thyroid Panel With TSH - CBC with Differential/Platelet -  Comprehensive metabolic panel  3. Nausea without vomiting Likely reflux due to hiatal hernia. Trial of H2 blocker. Additional evaluation if symptoms persist. - ranitidine (ZANTAC) 150 MG tablet; Take 1 tablet (150 mg total) by mouth 2 (two) times daily.  Dispense: 60 tablet; Refill: 3   Fernande Bras, PA-C Physician Assistant-Certified [facility]

## 2015-05-28 ENCOUNTER — Ambulatory Visit (INDEPENDENT_AMBULATORY_CARE_PROVIDER_SITE_OTHER): Payer: BLUE CROSS/BLUE SHIELD | Admitting: Podiatry

## 2015-05-28 ENCOUNTER — Encounter: Payer: Self-pay | Admitting: Podiatry

## 2015-05-28 ENCOUNTER — Ambulatory Visit: Payer: Self-pay

## 2015-05-28 VITALS — Temp 98.1°F

## 2015-05-28 DIAGNOSIS — M722 Plantar fascial fibromatosis: Secondary | ICD-10-CM

## 2015-05-28 DIAGNOSIS — M2040 Other hammer toe(s) (acquired), unspecified foot: Secondary | ICD-10-CM | POA: Diagnosis not present

## 2015-05-28 DIAGNOSIS — Z9889 Other specified postprocedural states: Secondary | ICD-10-CM

## 2015-05-28 LAB — COMPREHENSIVE METABOLIC PANEL
ALBUMIN: 4 g/dL (ref 3.6–5.1)
ALT: 16 U/L (ref 6–29)
AST: 16 U/L (ref 10–35)
Alkaline Phosphatase: 59 U/L (ref 33–115)
BILIRUBIN TOTAL: 0.4 mg/dL (ref 0.2–1.2)
BUN: 10 mg/dL (ref 7–25)
CALCIUM: 9.6 mg/dL (ref 8.6–10.2)
CO2: 26 mmol/L (ref 20–31)
CREATININE: 0.61 mg/dL (ref 0.50–1.10)
Chloride: 105 mmol/L (ref 98–110)
Glucose, Bld: 82 mg/dL (ref 65–99)
Potassium: 4.1 mmol/L (ref 3.5–5.3)
Sodium: 141 mmol/L (ref 135–146)
TOTAL PROTEIN: 6.3 g/dL (ref 6.1–8.1)

## 2015-05-28 LAB — CBC WITH DIFFERENTIAL/PLATELET
Basophils Absolute: 0.1 10*3/uL (ref 0.0–0.1)
Basophils Relative: 1 % (ref 0–1)
Eosinophils Absolute: 0.2 10*3/uL (ref 0.0–0.7)
Eosinophils Relative: 2 % (ref 0–5)
HEMATOCRIT: 43.3 % (ref 36.0–46.0)
HEMOGLOBIN: 14.7 g/dL (ref 12.0–15.0)
LYMPHS PCT: 27 % (ref 12–46)
Lymphs Abs: 2.7 10*3/uL (ref 0.7–4.0)
MCH: 31 pg (ref 26.0–34.0)
MCHC: 33.9 g/dL (ref 30.0–36.0)
MCV: 91.4 fL (ref 78.0–100.0)
MONO ABS: 0.7 10*3/uL (ref 0.1–1.0)
MONOS PCT: 7 % (ref 3–12)
MPV: 10.2 fL (ref 8.6–12.4)
NEUTROS ABS: 6.3 10*3/uL (ref 1.7–7.7)
NEUTROS PCT: 63 % (ref 43–77)
Platelets: 275 10*3/uL (ref 150–400)
RBC: 4.74 MIL/uL (ref 3.87–5.11)
RDW: 12.7 % (ref 11.5–15.5)
WBC: 10 10*3/uL (ref 4.0–10.5)

## 2015-05-28 LAB — THYROID PANEL WITH TSH
Free Thyroxine Index: 2.5 (ref 1.4–3.8)
T3 Uptake: 23 % (ref 22–35)
T4 TOTAL: 10.9 ug/dL (ref 4.5–12.0)
TSH: 0.69 mIU/L

## 2015-05-28 NOTE — Progress Notes (Signed)
Subjective:     Patient ID: [name], female   DOB: 03/11/1965, 50 y.o.   MRN: [id number]  HPI patient presents stating I'm doing real well with my right foot with minimal discomfort in the heel and the fifth toe feels good   Review of Systems     Objective:   Physical Exam Neurovascular status intact muscle strength adequate range of motion within normal limits with negative Homans sign and well-healed surgical site heel right and between the fourth and fifth toes the site on the fifth digit is healing well with stitches intact    Assessment:     Doing well post R exostectomy and endoscopic surgery right    Plan:     Reviewed condition and x-ray and reapplied sterile dressing and dispensed surgical shoe. Continue immobilization and reappoint 2 weeks for suture removal or earlier if needed.  X-rays indicate that there is good healing of the fifth digit with adequate spur resection

## 2015-05-29 ENCOUNTER — Encounter: Payer: Self-pay | Admitting: Physician Assistant

## 2015-05-30 LAB — URINE CULTURE

## 2015-06-07 NOTE — Progress Notes (Signed)
[facility] Exostectomy 5th right and Endoscopic Plantar Fasciotomy right foot.  Prescription was written for Vicodin 10/325 quantity 25.

## 2015-06-08 MED ORDER — NITROFURANTOIN MONOHYD MACRO 100 MG PO CAPS: 100.0000 mg | ORAL_CAPSULE | Freq: Two times a day (BID) | ORAL | Status: AC

## 2015-06-08 NOTE — Addendum Note (Signed)
Addended by: Fernande BrasJEFFERY, Drayton Tieu S on: 06/08/2015 08:24 AM   Modules accepted: Orders, SmartSet

## 2015-06-10 ENCOUNTER — Ambulatory Visit (INDEPENDENT_AMBULATORY_CARE_PROVIDER_SITE_OTHER): Payer: BLUE CROSS/BLUE SHIELD | Admitting: Podiatry

## 2015-06-10 ENCOUNTER — Ambulatory Visit (INDEPENDENT_AMBULATORY_CARE_PROVIDER_SITE_OTHER): Payer: BLUE CROSS/BLUE SHIELD

## 2015-06-10 DIAGNOSIS — M722 Plantar fascial fibromatosis: Secondary | ICD-10-CM | POA: Diagnosis not present

## 2015-06-10 DIAGNOSIS — M2040 Other hammer toe(s) (acquired), unspecified foot: Secondary | ICD-10-CM

## 2015-06-10 DIAGNOSIS — Z9889 Other specified postprocedural states: Secondary | ICD-10-CM | POA: Diagnosis not present

## 2015-06-13 NOTE — Progress Notes (Signed)
Subjective:     Patient ID: [name], female   DOB: 07/24/1965, 50 y.o.   MRN: [id number]  HPI patient states I'm doing well with some swelling discomfort and some irritation around my fifth toe and I wanted to make sure everything was okay as far as the bone goes   Review of Systems     Objective:   Physical Exam Neurovascular status intact negative Homans sign noted with pain in the plantar aspect right heel with fluid buildup around the medial band but localized in nature with stitches intact and fifth digit right stitches are intact but there is some swelling of the toe but no active drainage    Assessment:     Doing well postoperatively with patient being fairly active    Plan:     H&P and x-ray right foot reviewed. Stitches removed sterile dressings reapplied and compression and immobilization recommended. Reappoint in approximate 4 weeks or earlier if needed and encouraged to gradually increase activity levels  X-ray report indicates that the fifth digit is healing well and bone has been satisfactory resected

## 2015-06-29 ENCOUNTER — Emergency Department (HOSPITAL_COMMUNITY)
Admission: EM | Admit: 2015-06-29 | Discharge: 2015-06-29 | Disposition: A | Discharge status: Home / Self Care | Payer: No Typology Code available for payment source | Attending: Emergency Medicine | Admitting: Emergency Medicine

## 2015-06-29 ENCOUNTER — Encounter (HOSPITAL_COMMUNITY): Payer: Self-pay

## 2015-06-29 DIAGNOSIS — Y92410 Unspecified street and highway as the place of occurrence of the external cause: Secondary | ICD-10-CM | POA: Insufficient documentation

## 2015-06-29 DIAGNOSIS — S4992XA Unspecified injury of left shoulder and upper arm, initial encounter: Secondary | ICD-10-CM | POA: Insufficient documentation

## 2015-06-29 DIAGNOSIS — S4991XA Unspecified injury of right shoulder and upper arm, initial encounter: Secondary | ICD-10-CM | POA: Insufficient documentation

## 2015-06-29 DIAGNOSIS — G8929 Other chronic pain: Secondary | ICD-10-CM | POA: Insufficient documentation

## 2015-06-29 DIAGNOSIS — Z8659 Personal history of other mental and behavioral disorders: Secondary | ICD-10-CM | POA: Insufficient documentation

## 2015-06-29 DIAGNOSIS — Z79899 Other long term (current) drug therapy: Secondary | ICD-10-CM | POA: Insufficient documentation

## 2015-06-29 DIAGNOSIS — M25512 Pain in left shoulder: Secondary | ICD-10-CM | POA: Diagnosis not present

## 2015-06-29 DIAGNOSIS — M542 Cervicalgia: Secondary | ICD-10-CM | POA: Diagnosis not present

## 2015-06-29 DIAGNOSIS — Z791 Long term (current) use of non-steroidal anti-inflammatories (NSAID): Secondary | ICD-10-CM | POA: Diagnosis not present

## 2015-06-29 DIAGNOSIS — Y9389 Activity, other specified: Secondary | ICD-10-CM | POA: Diagnosis not present

## 2015-06-29 DIAGNOSIS — Z8719 Personal history of other diseases of the digestive system: Secondary | ICD-10-CM | POA: Insufficient documentation

## 2015-06-29 DIAGNOSIS — M545 Low back pain: Secondary | ICD-10-CM | POA: Diagnosis not present

## 2015-06-29 DIAGNOSIS — S199XXA Unspecified injury of neck, initial encounter: Secondary | ICD-10-CM | POA: Insufficient documentation

## 2015-06-29 DIAGNOSIS — M791 Myalgia, unspecified site: Secondary | ICD-10-CM

## 2015-06-29 DIAGNOSIS — Y998 Other external cause status: Secondary | ICD-10-CM | POA: Diagnosis not present

## 2015-06-29 DIAGNOSIS — S3992XA Unspecified injury of lower back, initial encounter: Secondary | ICD-10-CM | POA: Insufficient documentation

## 2015-06-29 DIAGNOSIS — Z8739 Personal history of other diseases of the musculoskeletal system and connective tissue: Secondary | ICD-10-CM | POA: Insufficient documentation

## 2015-06-29 DIAGNOSIS — M25511 Pain in right shoulder: Secondary | ICD-10-CM | POA: Diagnosis not present

## 2015-06-29 MED ORDER — CYCLOBENZAPRINE HCL 10 MG PO TABS: 10.0000 mg | ORAL_TABLET | Freq: Two times a day (BID) | ORAL | Status: DC | PRN

## 2015-06-29 MED ORDER — NAPROXEN 500 MG PO TABS: 500.0000 mg | ORAL_TABLET | Freq: Two times a day (BID) | ORAL | Status: DC

## 2015-06-29 NOTE — ED Notes (Signed)
PT DISCHARGED. INSTRUCTIONS AND PRESCRIPTIONS GIVEN. AAOX3. PT IN NO APPARENT DISTRESS. THE OPPORTUNITY TO ASK QUESTIONS WAS PROVIDED. 
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
## 2015-07-08 ENCOUNTER — Ambulatory Visit (INDEPENDENT_AMBULATORY_CARE_PROVIDER_SITE_OTHER): Payer: BLUE CROSS/BLUE SHIELD

## 2015-07-08 ENCOUNTER — Ambulatory Visit (INDEPENDENT_AMBULATORY_CARE_PROVIDER_SITE_OTHER): Payer: BLUE CROSS/BLUE SHIELD | Admitting: Podiatry

## 2015-07-08 ENCOUNTER — Encounter: Payer: Self-pay | Admitting: Podiatry

## 2015-07-08 DIAGNOSIS — M722 Plantar fascial fibromatosis: Secondary | ICD-10-CM | POA: Diagnosis not present

## 2015-07-08 DIAGNOSIS — Z9889 Other specified postprocedural states: Secondary | ICD-10-CM

## 2015-07-08 DIAGNOSIS — M2040 Other hammer toe(s) (acquired), unspecified foot: Secondary | ICD-10-CM

## 2015-07-08 NOTE — Progress Notes (Signed)
Subjective:     Patient ID: [name], female   DOB: 09/18/1965, 50 y.o.   MRN: [id number]  HPI patient states I'm doing pretty well with mild discomfort on the outside of my right foot and still slight swelling in my fifth toe   Review of Systems     Objective:   Physical Exam Neurovascular status intact muscle strength adequate with discomfort in the right fifth digit of mild nature and discomfort lateral side right foot localized in nature    Assessment:     Inflammatory condition with improvement of the plantar fascia and normal amount of discomfort for this. Postop    Plan:     Placed on diclofenac 75 mg twice a day for several weeks advised on continued physical therapy and occasional boot usage and gradual increase in activity levels. Reappoint to recheck again as needed  X-ray report indicated satisfactory section of bone fifth digit right with good alignment and no indications of depression of the arch

## 2015-08-18 ENCOUNTER — Telehealth: Payer: Self-pay | Admitting: Behavioral Health

## 2015-08-18 NOTE — Telephone Encounter (Signed)
Unable to reach patient at time of Pre-Visit Call.  Left message for patient to return call when available.    

## 2015-08-19 ENCOUNTER — Encounter: Payer: Self-pay | Admitting: Family Medicine

## 2015-08-19 ENCOUNTER — Ambulatory Visit (INDEPENDENT_AMBULATORY_CARE_PROVIDER_SITE_OTHER): Payer: BLUE CROSS/BLUE SHIELD | Admitting: Family Medicine

## 2015-08-19 VITALS — BP 118/82 | HR 93 | Temp 98.5°F | Ht 62.0 in | Wt 177.2 lb

## 2015-08-19 DIAGNOSIS — E669 Obesity, unspecified: Secondary | ICD-10-CM

## 2015-08-19 DIAGNOSIS — N912 Amenorrhea, unspecified: Secondary | ICD-10-CM

## 2015-08-19 NOTE — Progress Notes (Signed)
Pre visit review using our clinic review tool, if applicable. No additional management support is needed unless otherwise documented below in the visit note. 

## 2015-08-19 NOTE — Patient Instructions (Addendum)
It was nice to see you today I will be in [facility]- this will help us tell if you have been through early menopause  Please work on making some simple changes in your diet; we need to get you off the sweet tea.  You can drink unsweet or add splenda at home Please start planning what you are going to eat and bringing healthy foods along with you to work You do not have diabetes, but the american diabetes association website has good diet guidelines for everyone I will also refer you to see a nutritionist   Let's plan to recheck in 4-5 months and see how you are doing
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
## 2015-08-20 ENCOUNTER — Encounter: Payer: Self-pay | Admitting: Family Medicine

## 2015-08-20 LAB — FOLLICLE STIMULATING HORMONE: FSH: 84.9 m[IU]/mL

## 2015-08-25 ENCOUNTER — Telehealth: Payer: Self-pay | Admitting: Family Medicine

## 2015-08-25 NOTE — Telephone Encounter (Signed)
°  Relationship to patient: Self Can be reached: [phone]   Pharmacy:  Reason for call: Patient calling to follow up on request for lab results done on 6/22. States she has been waiting for a week.

## 2015-08-26 NOTE — Telephone Encounter (Signed)
If she needs more info, please let her know that the range of FSH post- menopause is 23.0-116.3 mIU. Generally any value over 35 is only seen in menopause

## 2015-08-26 NOTE — Telephone Encounter (Signed)
Spoke to pt. Pt [facility]. Results were reviewed with pt.   Pt understands that test results show that she is in menopause but pt would like to know what the test result range would be for someone who is postmenopausal.   Pt also wanted to check on nutrition referral. Checked on this and [facility]aiting appointment.  Pt can call [phone] if she would like to call directly to set up appointment.   Informed pt that her lab results were also mailed to her.

## 2015-08-27 ENCOUNTER — Telehealth: Payer: Self-pay | Admitting: Emergency Medicine

## 2015-08-27 NOTE — Telephone Encounter (Signed)
Tried to contact pt to give provider recommendations on post menopausal range. Left message for pt to return call.

## 2015-08-27 NOTE — Telephone Encounter (Signed)
Spoke to pt. Pt was given post menopausal test range as requested in previous conversation. Informed pt nutrition referral was sent. Pt states that she has not received call yet to schedule an appointment yet. [facility] [phone]  [phone].

## 2015-09-11 DIAGNOSIS — H1045 Other chronic allergic conjunctivitis: Secondary | ICD-10-CM | POA: Diagnosis not present

## 2015-09-21 DIAGNOSIS — F3340 Major depressive disorder, recurrent, in remission, unspecified: Secondary | ICD-10-CM | POA: Diagnosis not present

## 2015-09-23 ENCOUNTER — Encounter: Payer: BLUE CROSS/BLUE SHIELD | Attending: Family Medicine | Admitting: Dietician

## 2015-09-23 DIAGNOSIS — E669 Obesity, unspecified: Secondary | ICD-10-CM

## 2015-09-23 DIAGNOSIS — Z713 Dietary counseling and surveillance: Secondary | ICD-10-CM | POA: Diagnosis not present

## 2015-09-23 NOTE — Patient Instructions (Addendum)
-  Find something that you can do for yourself for stress relief -Don't feel guilty about taking time for you!  -Biking, photography  -Think about trying to find a new job  -Keep working on drinking more water and less tea  -Increase vegetable intake  -Split a steamer bag of veggies with Dorene Grebe  -Take advantage of in-season fresh vegetables  -Have Dorene Grebe help out with the cooking  -Check into [employer] financial aid for water aerobics  -Quick grab breakfast and lunch ideas: [employer] protein bar, yogurt, Vilma Meckel Delight products, fruit and string cheese  -Work on eating 3x a day

## 2015-09-23 NOTE — Progress Notes (Signed)
  Medical Nutrition Therapy:  Appt start time: 350 end time:  445  Assessment:  Primary concerns today: [name] states she is here because she wants to lose weight. She states she lost a lot of weight when her mom died in 07/03/06 but has never formally tried to lose weight in the past. Continues to struggle with her mom's death and her dad dating another woman. She lives very close to her family. [name] lives with her husband and her youngest daughter. She works at [employer] from [place] to 2pm Monday through Friday. Very unhappy with her job. She does not do much for fun or stress relief. She does the grocery shopping and cooking for her family. Her husband does not like vegetables. [name] has been trying to drink more water. She cooks dinner most nights and usually makes enough for leftovers. Has noticed weight gain around her waist and states that she is currently in menopause. Likes a variety of foods but has an erratic meal pattern.  Preferred Learning Style:  No preference indicated   Learning Readiness:   Ready  MEDICATIONS: none   DIETARY INTAKE:  Usual eating pattern includes 2 meals and 0 snacks per day. Avoided foods include Brussels Sprouts, Italian-style tomato sauce (spaghetti, pizza, etc)    24-hr recall:  B (5:30 AM): sometimes skips, biscuit and 32 oz 1/2 sweet tea  Snk ( AM):   L (9-10 AM): usually skips, may eat something at work like a sub or nachos Snk ( PM):  D (6-7 PM): meatloaf or chicken pie or taco casserole or fast food  Snk ( PM):   Beverages: sweet tea, water  Usual physical activity: none  Estimated energy needs: 1600-1800 calories  Progress Towards Goal(s):  In progress.   Nutritional Diagnosis:  [place]-3.3 Overweight/obesity As related to erratic meal pattern and inappropriate food choices.  As evidenced by BMI 31.8.    Intervention:  Nutrition counseling provided. -Find something that you can do for yourself for stress relief -Don't feel guilty about taking  time for you!  -Biking, photography -Think about trying to find a new job -Keep working on drinking more water and less tea -Increase vegetable intake  -Split a steamer bag of veggies with [name]  -Take advantage of in-season fresh vegetables -Have [name] help out with the cooking -Check into [employer] financial aid for water aerobics -Quick grab breakfast and lunch ideas: [employer] protein bar, yogurt, Health visitor products, fruit and string cheese -Work on eating 3x a day  Teaching Method Utilized:  Scientific laboratory technician Hands on  Handouts given during visit include:  [employer] financial aid application  Barriers to learning/adherence to lifestyle change: none  Demonstrated degree of understanding via:  Teach Back   Monitoring/Evaluation:  Dietary intake, exercise, and body weight in 2 month(s).

## 2015-09-27 ENCOUNTER — Encounter: Payer: Self-pay | Admitting: Dietician

## 2015-10-13 DIAGNOSIS — F3340 Major depressive disorder, recurrent, in remission, unspecified: Secondary | ICD-10-CM | POA: Diagnosis not present

## 2015-10-24 ENCOUNTER — Ambulatory Visit (HOSPITAL_COMMUNITY)
Admission: EM | Admit: 2015-10-24 | Discharge: 2015-10-24 | Disposition: A | Discharge status: Home / Self Care | Payer: BLUE CROSS/BLUE SHIELD | Attending: Surgery | Admitting: Surgery

## 2015-10-24 ENCOUNTER — Encounter (HOSPITAL_COMMUNITY): Payer: Self-pay | Admitting: Emergency Medicine

## 2015-10-24 DIAGNOSIS — N390 Urinary tract infection, site not specified: Secondary | ICD-10-CM | POA: Insufficient documentation

## 2015-10-24 DIAGNOSIS — Z833 Family history of diabetes mellitus: Secondary | ICD-10-CM | POA: Insufficient documentation

## 2015-10-24 DIAGNOSIS — A499 Bacterial infection, unspecified: Secondary | ICD-10-CM

## 2015-10-24 DIAGNOSIS — Z8249 Family history of ischemic heart disease and other diseases of the circulatory system: Secondary | ICD-10-CM | POA: Insufficient documentation

## 2015-10-24 LAB — POCT URINALYSIS DIP (DEVICE)
BILIRUBIN URINE: NEGATIVE
Glucose, UA: NEGATIVE mg/dL
KETONES UR: NEGATIVE mg/dL
Nitrite: POSITIVE — AB
PH: 5.5 (ref 5.0–8.0)
Protein, ur: 100 mg/dL — AB
SPECIFIC GRAVITY, URINE: 1.02 (ref 1.005–1.030)
Urobilinogen, UA: 0.2 mg/dL (ref 0.0–1.0)

## 2015-10-24 MED ORDER — SULFAMETHOXAZOLE-TRIMETHOPRIM 800-160 MG PO TABS: 1.0000 | ORAL_TABLET | Freq: Two times a day (BID) | ORAL | 0 refills | Status: AC

## 2015-10-24 MED ORDER — PHENAZOPYRIDINE HCL 200 MG PO TABS: 200.0000 mg | ORAL_TABLET | Freq: Three times a day (TID) | ORAL | 0 refills | Status: DC

## 2015-10-24 NOTE — ED Triage Notes (Signed)
Onset of symptoms last night.   Burning with urination, reports urgency, frequency.

## 2015-10-24 NOTE — ED Provider Notes (Signed)
[facility]    CSN: [id number] Arrival date & time: 10/24/15  1652  First Provider Contact:  None       History   Chief Complaint Chief Complaint  Patient presents with  . Recurrent UTI    HPI [name] is a 50 y.o. female.   HPI Patient presents today with complaints of dysuria and hematuria that started yesterday.  Also has had  Increased urinary frequency.  Some low back pain.  Denies fever, chills, abd/pelvic pain, nausea, vomiting, vaginal discharge.  States that she has not had a period in several years and this has been evaluated by ob/gyn and primary care.   Past Medical History:  Diagnosis Date  . Anxiety   . Chronic headaches   . Depression   . Gall stones   . Plantar fasciitis, bilateral     Patient Active Problem List   Diagnosis Date Noted  . Lumbar degenerative disc disease 05/07/2015  . Other mixed anxiety disorders 08/28/2014  . S/P laparoscopic cholecystectomy 07/05/2011  . BREAST PAIN 05/19/2009  . CERVICAL STRAIN 05/19/2009  . ALLERGIC RHINITIS 06/22/2008  . Migraine 06/22/2008  . DEPRESSION, MAJOR, MODERATE 06/15/2008  . PARESTHESIA 06/15/2008  . PULMONARY NODULE 04/17/2007  . COUGH 04/17/2007  . Chest pain, atypical 04/17/2007  . MENORRHAGIA 07/02/2006  . NOCTURIA 04/26/2006    Past Surgical History:  Procedure Laterality Date  . CHOLECYSTECTOMY  06/09/2011   Procedure: LAPAROSCOPIC CHOLECYSTECTOMY;  Surgeon: Liz Malady, MD;  Location: [facility] OR;  Service: General;  Laterality: N/A;  . DILATION AND CURETTAGE OF UTERUS    . ERCP  06/08/2011   Procedure: ENDOSCOPIC RETROGRADE CHOLANGIOPANCREATOGRAPHY (ERCP);  Surgeon: Theda Belfast, MD;  Location: [facility] ENDOSCOPY;  Service: Endoscopy;  Laterality: N/A;  . TONSILLECTOMY      OB History    No data available       Home Medications    Prior to Admission medications   Medication Sig [name] Date End Date Taking? Authorizing Provider  polyethylene glycol powder  (GLYCOLAX/MIRALAX) powder Reported on 08/19/2015 03/15/15   Historical Provider, MD  ranitidine (ZANTAC) 150 MG tablet Take 1 tablet (150 mg total) by mouth 2 (two) times daily. 05/27/15   Porfirio Oar, PA-C    Family History Family History  Problem Relation Age of Onset  . Diabetes Mother   . Heart disease Mother     AMI  . Heart failure Father   . Heart disease Father     CHF with defibrillator.  . Hypertension Sister   . Polycystic ovary syndrome Daughter     Social History Social History  Substance Use Topics  . Smoking status: Never Smoker  . Smokeless tobacco: Never Used  . Alcohol use Yes     Comment: occasionally, 1/month     Allergies   Review of patient's allergies indicates no known allergies.   Review of Systems Review of Systems  Constitutional: Negative.   HENT: Negative.   Eyes: Negative.   Respiratory: Negative.   Cardiovascular: Negative.   Gastrointestinal: Negative.   Genitourinary: Positive for difficulty urinating, dysuria and hematuria. Negative for menstrual problem, pelvic pain, vaginal bleeding, vaginal discharge and vaginal pain.  Musculoskeletal: Negative.   Neurological: Negative.   Hematological: Negative.   Psychiatric/Behavioral: Negative.      Physical Exam Triage Vital Signs ED Triage Vitals  Enc Vitals Group     BP 10/24/15 1822 117/79     Pulse Rate 10/24/15 1822 89  Resp 10/24/15 1822 18     Temp 10/24/15 1822 99 F (37.2 C)     Temp Source 10/24/15 1822 Oral     SpO2 10/24/15 1822 100 %     Weight 10/24/15 1822 174 lb (78.9 kg)     Height 10/24/15 1822 5\' 2"  (1.575 m)     Head Circumference --      Peak Flow --      Pain Score 10/24/15 1849 8     Pain Loc --      Pain Edu? --      Excl. in GC? --    No data found.   Updated Vital Signs BP 117/79 (BP Location: Right Arm)   Pulse 89   Temp 99 F (37.2 C) (Oral)   Resp 18   Ht 5\' 2"  (1.575 m)   Wt 174 lb (78.9 kg)   SpO2 100%   BMI 31.83 kg/m    Visual Acuity Right Eye Distance:   Left Eye Distance:   Bilateral Distance:    Right Eye Near:   Left Eye Near:    Bilateral Near:     Physical Exam  Constitutional: She is oriented to person, place, and time. She appears well-developed. No distress.  HENT:  Head: Normocephalic and atraumatic.  Eyes: Pupils are equal, round, and reactive to light.  Cardiovascular: Normal rate and normal heart sounds.   Pulmonary/Chest: Effort normal and breath sounds normal. No respiratory distress.  Abdominal: Soft.  Musculoskeletal: Normal range of motion.  Neurological: She is alert and oriented to person, place, and time.  Skin: Skin is warm and dry.  Psychiatric: She has a normal mood and affect.     UC Treatments / Results  Labs (all labs ordered are listed, but only abnormal results are displayed) Labs Reviewed  POCT URINALYSIS DIP (DEVICE) - Abnormal; Notable for the following:       Result Value   Hgb urine dipstick LARGE (*)    Protein, ur 100 (*)    Nitrite POSITIVE (*)    Leukocytes, UA LARGE (*)    All other components within normal limits  URINE CULTURE    EKG  EKG Interpretation None       Radiology No results found.  Procedures Procedures (including critical care time)  Medications Ordered in UC Medications - No data to display   Initial Impression / Assessment and Plan / UC Course  I have reviewed the triage vital signs and the nursing notes.  Pertinent labs & imaging results that were available during my care of the patient were reviewed by me and considered in my medical decision making (see chart for details).  Clinical Course      Final Clinical Impressions(s) / UC Diagnoses   Final diagnoses:  UTI (urinary tract infection), bacterial    New Prescriptions New Prescriptions   PHENAZOPYRIDINE (PYRIDIUM) 200 MG TABLET    Take 1 tablet (200 mg total) by mouth 3 (three) times daily.   SULFAMETHOXAZOLE-TRIMETHOPRIM (BACTRIM DS,SEPTRA DS)  800-160 MG TABLET    Take 1 tablet by mouth 2 (two) times daily.  patient advised to schedule follow up visit with primary care in a few days if symptoms not improving.  Will also get urine culture.  All questions answered.     Naida SleightJames M Rutilio Yellowhair, PA-C 10/24/15 1918

## 2015-10-26 DIAGNOSIS — F3340 Major depressive disorder, recurrent, in remission, unspecified: Secondary | ICD-10-CM | POA: Diagnosis not present

## 2015-10-27 LAB — URINE CULTURE: Culture: >=100000 — AB

## 2015-10-29 ENCOUNTER — Telehealth (HOSPITAL_COMMUNITY): Payer: Self-pay | Admitting: Emergency Medicine

## 2015-10-29 NOTE — Telephone Encounter (Signed)
-----   Message from Roberta MooreLaura W Murray, MD sent at 10/27/2015  5:09 PM EDT ----- Please let patient know that urine culture was positive for E coli, sensitive to trimethoprim/sulfa.  [facility]  Recheck or followup with PCP/Jessica Copland for further evaluation if symptoms persist.  LM

## 2015-10-29 NOTE — Telephone Encounter (Signed)
Pt called back. Reports feeling better and sx have subsided and has 3 more days of antibiotics.  Adv pt if sx are not getting better to return or to f/u w/PCP Pt verb understanding.

## 2015-10-29 NOTE — Telephone Encounter (Signed)
LM on pt's VM [phone] Need to see how pt is doing and to give lab results from recent visit on 8/27 Also let pt know labs can be obtained from MyChart

## 2015-11-04 ENCOUNTER — Encounter (HOSPITAL_COMMUNITY): Payer: Self-pay | Admitting: Emergency Medicine

## 2015-11-04 ENCOUNTER — Ambulatory Visit (INDEPENDENT_AMBULATORY_CARE_PROVIDER_SITE_OTHER): Payer: BLUE CROSS/BLUE SHIELD

## 2015-11-04 ENCOUNTER — Ambulatory Visit (HOSPITAL_COMMUNITY)
Admission: EM | Admit: 2015-11-04 | Discharge: 2015-11-04 | Disposition: A | Discharge status: Home / Self Care | Payer: BLUE CROSS/BLUE SHIELD | Attending: Internal Medicine | Admitting: Internal Medicine

## 2015-11-04 ENCOUNTER — Encounter: Payer: Self-pay | Admitting: Podiatry

## 2015-11-04 ENCOUNTER — Ambulatory Visit (INDEPENDENT_AMBULATORY_CARE_PROVIDER_SITE_OTHER): Payer: BLUE CROSS/BLUE SHIELD | Admitting: Podiatry

## 2015-11-04 VITALS — Ht 62.0 in | Wt 174.0 lb

## 2015-11-04 DIAGNOSIS — R21 Rash and other nonspecific skin eruption: Secondary | ICD-10-CM

## 2015-11-04 DIAGNOSIS — R52 Pain, unspecified: Secondary | ICD-10-CM

## 2015-11-04 DIAGNOSIS — M779 Enthesopathy, unspecified: Secondary | ICD-10-CM | POA: Diagnosis not present

## 2015-11-04 DIAGNOSIS — N390 Urinary tract infection, site not specified: Secondary | ICD-10-CM

## 2015-11-04 DIAGNOSIS — T7840XA Allergy, unspecified, initial encounter: Secondary | ICD-10-CM

## 2015-11-04 MED ORDER — PHENAZOPYRIDINE HCL 200 MG PO TABS: 200.0000 mg | ORAL_TABLET | Freq: Three times a day (TID) | ORAL | 0 refills | Status: DC

## 2015-11-04 MED ORDER — ACETAMINOPHEN 325 MG PO TABS: 975.0000 mg | ORAL_TABLET | Freq: Once | ORAL | Status: DC

## 2015-11-04 MED ORDER — IBUPROFEN 800 MG PO TABS: 800.0000 mg | ORAL_TABLET | Freq: Once | ORAL | Status: DC

## 2015-11-04 MED ORDER — IBUPROFEN 800 MG PO TABS
ORAL_TABLET | ORAL | Status: AC
  Filled 2015-11-04: qty 1

## 2015-11-04 MED ORDER — ACETAMINOPHEN 325 MG PO TABS
ORAL_TABLET | ORAL | Status: AC
  Filled 2015-11-04: qty 3

## 2015-11-04 MED ORDER — CEPHALEXIN 500 MG PO CAPS: 500.0000 mg | ORAL_CAPSULE | Freq: Two times a day (BID) | ORAL | 0 refills | Status: AC

## 2015-11-04 MED ORDER — TRAMADOL HCL 50 MG PO TABS: 50.0000 mg | ORAL_TABLET | Freq: Three times a day (TID) | ORAL | 2 refills | Status: DC

## 2015-11-04 MED ORDER — TRIAMCINOLONE ACETONIDE 10 MG/ML IJ SUSP
10.0000 mg | Freq: Once | INTRAMUSCULAR | Status: AC
  Administered 2015-11-04: 10 mg

## 2015-11-04 NOTE — Discharge Instructions (Signed)
°  You may take over the counter benadryl to help with itching from rash that is likely due to the Bactrim you were on.  Be sure to stop taking the bactrim.  The rash may take a 2-3 days to resolve but may improve faster with benadryl.  Be sure to take all of your new antibiotic, cephalexin (Keflex) and do not skip any doses to help prevent infection from coming back. If you develop symptoms with new medication, please stop taking, and call your family provider for further evaluation and change in treatment.  Be sure to stay well hydrated.

## 2015-11-04 NOTE — ED Triage Notes (Signed)
Patient noticed left flank and back pain started today and has a headache.  Burning with urination reoccurred today

## 2015-11-04 NOTE — Progress Notes (Signed)
Chief Complaint  Patient presents with  . Foot Pain    Estab Pt.:  "They hurt bad.  They ache, sore and tender.  The right surgery foot hurts the most.  It hurts 5 times more than the left one.  I had surgery about 5.5 months ago."

## 2015-11-04 NOTE — ED Provider Notes (Signed)
CSN: [id number]     Arrival date & time 11/04/15  1639 History   First MD Initiated Contact with Patient 11/04/15 1812     Chief Complaint  Patient presents with  . Urinary Tract Infection   (Consider location/radiation/quality/duration/timing/severity/associated sxs/prior Treatment) HPI  [name] is a 50 y.o. female presenting to UC with c/o rash to both arms, throat irritation, and headache after taking bactrim earlier today for UTI that was dx last week. Pt reports taking 2 days of bactrim last week but stopped taking as she went to the beach and knew she should not take a Sulfa drug while in the sun.  She did not have any symptoms of rash or headache the first 2 days but states current allergic reaction symptoms started shortly after taking the antibiotic earlier today.  Rash is mildly pruritic. Denies lip or tongue swelling. Denies SOB. She has not had anything for her allergy symptoms. She notes she is also still having urinary symptoms including dysuria, urinary frequency and now Left sided flank pain.  Symptoms are moderate in severity.  She had been taking Pyridium as prescribed but only had 3 days worth, which she took last week. None today.  Denies fever, chills, n/v/d.   Past Medical History:  Diagnosis Date  . Anxiety   . Chronic headaches   . Depression   . Gall stones   . Plantar fasciitis, bilateral    Past Surgical History:  Procedure Laterality Date  . CHOLECYSTECTOMY  06/09/2011   Procedure: LAPAROSCOPIC CHOLECYSTECTOMY;  Surgeon: Liz MaladyBurke E Thompson, MD;  [facility];  Service: General;  Laterality: N/A;  . DILATION AND CURETTAGE OF UTERUS    . ERCP  06/08/2011   Procedure: ENDOSCOPIC RETROGRADE CHOLANGIOPANCREATOGRAPHY (ERCP);  Surgeon: Theda BelfastPatrick D Hung, MD;  [facility];  Service: Endoscopy;  Laterality: N/A;  . TONSILLECTOMY     Family History  Problem Relation Age of Onset  . Diabetes Mother   . Heart disease Mother     AMI  . Heart failure  Father   . Heart disease Father     CHF with defibrillator.  . Hypertension Sister   . Polycystic ovary syndrome Daughter    Social History  Substance Use Topics  . Smoking status: Never Smoker  . Smokeless tobacco: Never Used  . Alcohol use Yes     Comment: occasionally, 1/month   OB History    No data available     Review of Systems  Constitutional: Negative for chills, fatigue and fever.  HENT: Positive for sore throat (irritated). Negative for ear pain, trouble swallowing and voice change.   Respiratory: Negative for cough, shortness of breath, wheezing and stridor.   Cardiovascular: Negative for chest pain and palpitations.  Gastrointestinal: Negative for abdominal pain, constipation, diarrhea, nausea and vomiting.  Genitourinary: Positive for dysuria, flank pain (Left), frequency and urgency. Negative for decreased urine volume, hematuria, pelvic pain, vaginal bleeding, vaginal discharge and vaginal pain.  Musculoskeletal: Positive for back pain. Negative for arthralgias and myalgias.  Skin: Positive for rash. Negative for wound.  Neurological: Positive for headaches. Negative for dizziness and light-headedness.    Allergies  Bactrim [sulfamethoxazole-trimethoprim]  Home Medications   Prior to Admission medications   Medication Sig Start Date End Date Taking? Authorizing Provider  cephALEXin (KEFLEX) 500 MG capsule Take 1 capsule (500 mg total) by mouth 2 (two) times daily. 11/04/15 11/14/15  Junius FinnerErin O'Malley, PA-C  phenazopyridine (PYRIDIUM) 200 MG tablet Take 1 tablet (200 mg total)  by mouth 3 (three) times daily. 11/04/15   Junius Finner, PA-C  polyethylene glycol powder (GLYCOLAX/MIRALAX) powder Reported on 08/19/2015 03/15/15   Historical Provider, MD  ranitidine (ZANTAC) 150 MG tablet Take 1 tablet (150 mg total) by mouth 2 (two) times daily. 05/27/15   Chelle Jeffery, PA-C  traMADol (ULTRAM) 50 MG tablet Take 1 tablet (50 mg total) by mouth 3 (three) times daily. 11/04/15    Lenn Sink, DPM   Meds Ordered and Administered this Visit   Medications  acetaminophen (TYLENOL) tablet 975 mg (not administered)  ibuprofen (ADVIL,MOTRIN) tablet 800 mg (not administered)    BP (!) 141/103 (BP Location: Right Arm)   Pulse 104   Temp 97.4 F (36.3 C) (Oral)   Resp 18   SpO2 99%  No data found.   Physical Exam  Constitutional: She is oriented to person, place, and time. She appears well-developed and well-nourished. No distress.  Pt sitting on exam bed, NAD. Non-toxic appearing.  HENT:  Head: Normocephalic and atraumatic.  Right Ear: Tympanic membrane normal.  Left Ear: Tympanic membrane normal.  Nose: Nose normal.  Mouth/Throat: Uvula is midline, oropharynx is clear and moist and mucous membranes are normal.  Eyes: EOM are normal. Pupils are equal, round, and reactive to light.  Neck: Normal range of motion. Neck supple.  Cardiovascular: Normal rate and regular rhythm.   Pulmonary/Chest: Effort normal. No stridor. No respiratory distress. She has no wheezes. She has no rales.  Abdominal: Soft. She exhibits no distension and no mass. There is no tenderness. There is CVA tenderness ( Left). There is no rebound and no guarding.  Musculoskeletal: Normal range of motion.  Lymphadenopathy:    She has no cervical adenopathy.  Neurological: She is alert and oriented to person, place, and time.  Skin: Skin is warm and dry. Rash noted. She is not diaphoretic. There is erythema.  Bilateral arms: diffuse erythematous papular rash. Does blanch. Non-tender. No bleeding or discharge.  Psychiatric: She has a normal mood and affect. Her behavior is normal.  Nursing note and vitals reviewed.   Urgent Care Course   Clinical Course    Procedures (including critical care time)  Labs Review Labs Reviewed - No data to display  Imaging Review No results found.   MDM   1. UTI (lower urinary tract infection)   2. Allergic reaction caused by a drug   3. Rash     Pt c/o continued urinary symptoms from last week and now allergic reaction to Bactrim.    Urine culture from last week- Keflex should be effective  Will have pt discontinue Bactrim and start taking keflex. Encouraged to take the entire course and to not skip doses.   Acetaminophen and ibuprofen given in UC for HA. Pt may take benadryl when she gets at home for rash. Advised it may cause drowsiness, advised not to rive while taking. F/u with PCP in 4-5 days if not improving, sooner if worsening. Patient verbalized understanding and agreement with treatment plan.     Junius Finner, PA-C 11/04/15 [phone]

## 2015-11-04 NOTE — ED Triage Notes (Signed)
Seen 1 1/2 weeks ago for uti and provided a prescription.  Took 2 days of dosing of medicine, then stopped while at the beach.  Medicine did not bother her.  Since returning from the beach took a dose today.  Now patient has a rash, itching, teeth hurting .  Patient reports about fifteen minutes after taking dose today felt like ears felt funny, throat felt funny.  Denies difficulty breathing.

## 2015-11-07 NOTE — Progress Notes (Signed)
Subjective:     Patient ID: [name], female   DOB: 12/29/1965, 50 y.o.   MRN: [id number]  HPI patient presents stating she's been getting pain on the outside of her foot and her heel is not hurting but the outside of her foot is quite sore   Review of Systems     Objective:   Physical Exam Neurovascular status intact muscle strength adequate with inflammation and pain in the lateral side of the right foot around the peroneal insertion with fluid buildup noted    Assessment:     Peroneal tendinitis right with inflammation fluid buildup    Plan:     Reviewed that this may be a compensatory mechanism or its own separate problems and today went ahead and did a sheath injection right 3 mg Kenalog 5 mg Xylocaine and applied fascial brace lifting up the lateral side of the foot along with instructions on ice and supportive shoe gear. Reappoint to recheck  X-rays indicate that there is no stress fracture and I did not note depression of the arch

## 2015-11-10 DIAGNOSIS — F3340 Major depressive disorder, recurrent, in remission, unspecified: Secondary | ICD-10-CM | POA: Diagnosis not present

## 2015-11-10 DIAGNOSIS — F411 Generalized anxiety disorder: Secondary | ICD-10-CM | POA: Diagnosis not present

## 2015-11-10 DIAGNOSIS — F4310 Post-traumatic stress disorder, unspecified: Secondary | ICD-10-CM | POA: Diagnosis not present

## 2015-11-24 ENCOUNTER — Ambulatory Visit: Payer: Self-pay | Admitting: Dietician

## 2015-12-02 ENCOUNTER — Ambulatory Visit (INDEPENDENT_AMBULATORY_CARE_PROVIDER_SITE_OTHER): Payer: BLUE CROSS/BLUE SHIELD | Admitting: Podiatry

## 2015-12-02 DIAGNOSIS — M779 Enthesopathy, unspecified: Secondary | ICD-10-CM | POA: Diagnosis not present

## 2015-12-02 MED ORDER — TRAMADOL HCL 50 MG PO TABS: 50.0000 mg | ORAL_TABLET | Freq: Three times a day (TID) | ORAL | 2 refills | Status: DC

## 2015-12-02 MED ORDER — TRIAMCINOLONE ACETONIDE 10 MG/ML IJ SUSP
10.0000 mg | Freq: Once | INTRAMUSCULAR | Status: AC
  Administered 2015-12-02: 10 mg

## 2015-12-05 NOTE — Progress Notes (Signed)
Subjective:     Patient ID: Magdalen Spatzammy Moore Nahar, female   DOB: 07/28/1965, [age] y.o.   MRN: [id number]  HPI patient presents stating that she is doing better but she is having pain in the right foot with what appears to be more of an inflammatory joint issue than anything else with heel doing well   Review of Systems     Objective:   Physical Exam Neurovascular status intact with inflammation base of fifth metatarsal right which may be inflammatory or related to compensatory gait change with heel feeling better and arch in reasonably good position with minimal discomfort    Assessment:     Doing well overall with inflammatory changes lateral    Plan:    careful capsular bursal injection administered 3 mg [place] 5 mg Xylocaine advised on heat ice therapy and continued utilization of support and reappoint as needed  X-rays were negative for fracture or other bony contusion or pathology

## 2015-12-13 DIAGNOSIS — E860 Dehydration: Secondary | ICD-10-CM | POA: Diagnosis not present

## 2015-12-13 DIAGNOSIS — R112 Nausea with vomiting, unspecified: Secondary | ICD-10-CM | POA: Diagnosis not present

## 2015-12-13 DIAGNOSIS — R109 Unspecified abdominal pain: Secondary | ICD-10-CM | POA: Diagnosis not present

## 2015-12-20 ENCOUNTER — Ambulatory Visit (INDEPENDENT_AMBULATORY_CARE_PROVIDER_SITE_OTHER): Payer: BLUE CROSS/BLUE SHIELD | Admitting: Family Medicine

## 2015-12-20 VITALS — BP 122/72 | HR 105 | Temp 98.6°F | Resp 17 | Ht 63.5 in | Wt 167.0 lb

## 2015-12-20 DIAGNOSIS — K5900 Constipation, unspecified: Secondary | ICD-10-CM | POA: Diagnosis not present

## 2015-12-20 DIAGNOSIS — R52 Pain, unspecified: Secondary | ICD-10-CM | POA: Diagnosis not present

## 2015-12-20 DIAGNOSIS — H9203 Otalgia, bilateral: Secondary | ICD-10-CM | POA: Diagnosis not present

## 2015-12-20 DIAGNOSIS — R1013 Epigastric pain: Secondary | ICD-10-CM | POA: Diagnosis not present

## 2015-12-20 DIAGNOSIS — J029 Acute pharyngitis, unspecified: Secondary | ICD-10-CM

## 2015-12-20 LAB — POCT CBC
GRANULOCYTE PERCENT: 61.8 % (ref 37–80)
HEMATOCRIT: 42.6 % (ref 37.7–47.9)
Hemoglobin: 15.1 g/dL (ref 12.2–16.2)
LYMPH, POC: 1.5 (ref 0.6–3.4)
MCH, POC: 32 pg — AB (ref 27–31.2)
MCHC: 35.5 g/dL — AB (ref 31.8–35.4)
MCV: 90.2 fL (ref 80–97)
MID (CBC): 0.7 (ref 0–0.9)
MPV: 7.2 fL (ref 0–99.8)
PLATELET COUNT, POC: 182 10*3/uL (ref 142–424)
POC GRANULOCYTE: 3.6 (ref 2–6.9)
POC LYMPH %: 26.6 % (ref 10–50)
POC MID %: 11.6 %M (ref 0–12)
RBC: 4.72 M/uL (ref 4.04–5.48)
RDW, POC: 13.4 %
WBC: 5.8 10*3/uL (ref 4.6–10.2)

## 2015-12-20 LAB — POCT INFLUENZA A/B
INFLUENZA B, POC: NEGATIVE
Influenza A, POC: NEGATIVE

## 2015-12-20 MED ORDER — IPRATROPIUM BROMIDE 0.03 % NA SOLN: 2.0000 | Freq: Two times a day (BID) | NASAL | 0 refills | Status: DC

## 2015-12-20 MED ORDER — RANITIDINE HCL 150 MG PO TABS: 150.0000 mg | ORAL_TABLET | Freq: Two times a day (BID) | ORAL | 0 refills | Status: DC

## 2015-12-20 MED ORDER — POLYETHYLENE GLYCOL 3350 17 GM/SCOOP PO POWD: 17.0000 g | Freq: Two times a day (BID) | ORAL | 1 refills | Status: DC | PRN

## 2015-12-20 MED ORDER — ONDANSETRON 4 MG PO TBDP: 4.0000 mg | ORAL_TABLET | Freq: Three times a day (TID) | ORAL | 0 refills | Status: DC | PRN

## 2015-12-20 NOTE — Progress Notes (Signed)
Patient ID: [name], female    DOB: 09/08/1965, 50 y.o.   MRN: [id number]  PCP: Abbe Amsterdam, MD  Chief Complaint  Patient presents with  . Nausea  . Emesis  . Fatigue  . Immunizations    flu     Subjective:   HPI 50 year old female, presents for evaluation of abdominal pain, nausea, fatigue, and body aches times 4 days. She is an established patient here at [facility]. Reports illness started as nausea, mid-epigastric pain, and body aches. The following day, she was driving and hadn't eaten and food, vomited, developed a intermittent headache, sore throat, which has persisted intermittent since then. Reports some minor nasal drainage and occasional cough. She has been eating small amounts and admits to not drinking enough. Reports bilateral ear pain. Denies fever, has had episodes of hot and cold sensations. No diarrhea although has a chronic issue of constipation. Denies eating any different meals or being around anyone sick except her daughter is now complaining of sore throat without the GI symptoms.  Social History   Social History  . Marital status: Married    Spouse name: Loraine Leriche  . Number of children: N/A  . Years of education: 12th   Occupational History  . 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 Smoking status: Never Smoker  . Smokeless tobacco: Never Used  . Alcohol use Yes     Comment: occasionally, 1/month  . Drug use: No  . Sexual activity: Yes    Birth control/ protection: Injection, None     Comment: no menstruation in "years."   Other Topics Concern  . Not on file   Social History Narrative   Marital status: Married      Children: 3 children      Lives: with daughter and husband      Employment: works at [employer] in [place]      Tobacco; none      Alcohol:  Socially; weekends.      Drugs: none     Exercise: none   Family History  Problem Relation Age of Onset  . Diabetes Mother   . Heart disease  Mother     AMI  . Heart failure Father   . Heart disease Father     CHF with defibrillator.  . Hypertension Sister   . Polycystic ovary syndrome Daughter    Review of Systems See HPI  Patient Active Problem List   Diagnosis Date Noted  . Lumbar degenerative disc disease 05/07/2015  . Other mixed anxiety disorders 08/28/2014  . S/P laparoscopic cholecystectomy 07/05/2011  . BREAST PAIN 05/19/2009  . CERVICAL STRAIN 05/19/2009  . ALLERGIC RHINITIS 06/22/2008  . Migraine 06/22/2008  . DEPRESSION, MAJOR, MODERATE 06/15/2008  . PARESTHESIA 06/15/2008  . PULMONARY NODULE 04/17/2007  . COUGH 04/17/2007  . Chest pain, atypical 04/17/2007  . MENORRHAGIA 07/02/2006  . NOCTURIA 04/26/2006     Prior to Admission medications   Medication Sig Start Date End Date Taking? Authorizing Provider  traMADol (ULTRAM) 50 MG tablet Take 1 tablet (50 mg total) by mouth 3 (three) times daily. 11/04/15  Yes Lenn Sink, DPM  traMADol (ULTRAM) 50 MG tablet Take 1 tablet (50 mg total) by mouth 3 (three) times daily. 12/02/15  Yes Lenn Sink, DPM  traMADol (ULTRAM) 50 MG tablet Take 1 tablet (50 mg total) by mouth 3 (three) times daily. 12/02/15  Yes Lenn Sink, DPM  Allergies  Allergen Reactions  . Bactrim [Sulfamethoxazole-Trimethoprim] Rash and Other (See Comments)    Pt states she developed a rash on arms, throat soreness, headache and ear pain.        Objective:  Physical Exam  Constitutional: She is oriented to person, place, and time. She appears well-developed and well-nourished.  HENT:  Head: Normocephalic and atraumatic.  Right Ear: External ear normal.  Left Ear: External ear normal.  Nose: Nose normal.  Mouth/Throat: Oropharynx is clear and moist. No oropharyngeal exudate, posterior oropharyngeal edema or posterior oropharyngeal erythema.  Eyes: Pupils are equal, round, and reactive to light.  Cardiovascular: Normal rate, regular rhythm, normal heart sounds and intact  distal pulses.   Pulmonary/Chest: Effort normal and breath sounds normal.  Abdominal: Soft. She exhibits no distension. Bowel sounds are increased. There is tenderness in the epigastric area. There is no rigidity, no rebound, no guarding and no CVA tenderness.  Bilateral ear negative suppurative changes Mid epigastric tenderness with deep palpation. Neg left and right lower quad pain.  Musculoskeletal: Normal range of motion.  Neurological: She is alert and oriented to person, place, and time.  Skin: Skin is warm and dry.  Psychiatric: She has a normal mood and affect. Her behavior is normal. Judgment and thought content normal.    Vitals:   12/20/15 1023  BP: 122/72  Pulse: (!) 105  Resp: 17  Temp: 98.6 F (37 C)   Assessment & Plan:  1. Abdominal pain, epigastric 2. Body aches - POCT CBC - POCT Influenza A/B 3. Sore throat - POCT Influenza A/B 4. Constipation, unspecified constipation type 5. Acute ear pain, bilateral   50 year old female, presents with what is likely symptoms associated with a viral illness, dyspepsia, and constipation. She is afebrile, normal CBC, and negative rapid influenza. Will treat symptomatically. Patient is encouraged to return if symptoms do not improve.  Plan:  Start ranitidine 150 mg twice daily for epigastric discomfort.  Start Atrovent 2 puffs, 2 times daily for nasal congestion.  Take Zofran 4 mg as needed for nausea.  Constipation -Miralax twice daily until stool burden is relieved.  If symptoms do not improve within 3-4 days, please return for care.   [name], MSN, FNP-C [facility]

## 2015-12-20 NOTE — Patient Instructions (Addendum)
Start ranitidine 150 mg twice daily for epigastric discomfort. Start Atrovent 2 puffs, 2 times daily for nasal congestion. Take Zofran 4 mg as needed for nausea. Constipation -Miralax twice daily until stool burden is relieved.  If symptoms do not improve within 3-4 days, please return for care.  [name], MSN, FNP-C [facility]   IF you received an x-ray today, you will receive an invoice from [facility]. Please contact [facility] at [phone] with questions or concerns regarding your invoice.   IF you received labwork today, you will receive an invoice from [facility]. Please contact [facility] at [phone] with questions or concerns regarding your invoice.   Our billing staff will not be able to assist you with questions regarding bills from these companies.  You will be contacted with the lab results as soon as they are available. The fastest way to get your results is to activate your My Chart account. Instructions are located on the last page of this paperwork. If you have not heard from us regarding the results in 2 weeks, please contact this office.

## 2015-12-28 DIAGNOSIS — F411 Generalized anxiety disorder: Secondary | ICD-10-CM | POA: Diagnosis not present

## 2015-12-28 DIAGNOSIS — F3340 Major depressive disorder, recurrent, in remission, unspecified: Secondary | ICD-10-CM | POA: Diagnosis not present

## 2015-12-28 DIAGNOSIS — F4310 Post-traumatic stress disorder, unspecified: Secondary | ICD-10-CM | POA: Diagnosis not present

## 2016-01-05 ENCOUNTER — Ambulatory Visit: Payer: Self-pay | Admitting: Family Medicine

## 2016-02-02 ENCOUNTER — Encounter (HOSPITAL_COMMUNITY): Payer: Self-pay | Admitting: Emergency Medicine

## 2016-02-02 ENCOUNTER — Emergency Department (HOSPITAL_COMMUNITY)
Admission: EM | Admit: 2016-02-02 | Discharge: 2016-02-02 | Disposition: A | Discharge status: Home / Self Care | Payer: BLUE CROSS/BLUE SHIELD | Attending: Emergency Medicine | Admitting: Emergency Medicine

## 2016-02-02 DIAGNOSIS — R51 Headache: Secondary | ICD-10-CM | POA: Diagnosis present

## 2016-02-02 DIAGNOSIS — Z7982 Long term (current) use of aspirin: Secondary | ICD-10-CM | POA: Insufficient documentation

## 2016-02-02 DIAGNOSIS — G44209 Tension-type headache, unspecified, not intractable: Secondary | ICD-10-CM | POA: Diagnosis not present

## 2016-02-02 DIAGNOSIS — Z79899 Other long term (current) drug therapy: Secondary | ICD-10-CM | POA: Diagnosis not present

## 2016-02-02 MED ORDER — KETOROLAC TROMETHAMINE 15 MG/ML IJ SOLN
15.0000 mg | Freq: Once | INTRAMUSCULAR | Status: AC
  Administered 2016-02-02: 15 mg via INTRAVENOUS
  Filled 2016-02-02: qty 1

## 2016-02-02 MED ORDER — BUTALBITAL-APAP-CAFFEINE 50-325-40 MG PO TABS: 1.0000 | ORAL_TABLET | Freq: Four times a day (QID) | ORAL | 0 refills | Status: DC | PRN

## 2016-02-02 MED ORDER — SODIUM CHLORIDE 0.9 % IV BOLUS (SEPSIS)
1000.0000 mL | Freq: Once | INTRAVENOUS | Status: AC
  Administered 2016-02-02: 1000 mL via INTRAVENOUS

## 2016-02-02 MED ORDER — PROCHLORPERAZINE EDISYLATE 5 MG/ML IJ SOLN
10.0000 mg | Freq: Once | INTRAMUSCULAR | Status: AC
  Administered 2016-02-02: 10 mg via INTRAVENOUS
  Filled 2016-02-02: qty 2

## 2016-02-02 MED ORDER — DIPHENHYDRAMINE HCL 50 MG/ML IJ SOLN
25.0000 mg | Freq: Once | INTRAMUSCULAR | Status: AC
  Administered 2016-02-02: 25 mg via INTRAVENOUS
  Filled 2016-02-02: qty 1

## 2016-02-02 MED ORDER — DEXAMETHASONE SODIUM PHOSPHATE 10 MG/ML IJ SOLN
10.0000 mg | Freq: Once | INTRAMUSCULAR | Status: AC
  Administered 2016-02-02: 10 mg via INTRAVENOUS
  Filled 2016-02-02: qty 1

## 2016-02-02 NOTE — ED Provider Notes (Signed)
[facility]-EMERGENCY DEPT Provider Note   CSN: [id number] Arrival date & time: 02/02/16  1551     History   Chief Complaint Chief Complaint  Patient presents with  . Migraine    HPI [name] is a 50 y.o. female.  HPI 50 year old female with history of chronic headaches who presents with generalized headache for 2 weeks. The patient states that over the last 2 weeks, she has had a dull, aching, tension type headache that is in her frontal area and radiates to the back of her head. The pain is similar to her chronic headaches, but she had not had one for several weeks. She does admit to increased stress at home. Denies any vision changes, focal numbness or weakness. Denies any fevers or chills. Denies any neck pain or neck stiffness. The headache began gradually and is similar to her usual headaches. She has been previously imaged as well as evaluate by neurologist for this, though she does not currently have a neurologist.  Past Medical History:  Diagnosis Date  . Anxiety   . Chronic headaches   . Depression   . Gall stones   . Plantar fasciitis, bilateral     Patient Active Problem List   Diagnosis Date Noted  . Lumbar degenerative disc disease 05/07/2015  . Other mixed anxiety disorders 08/28/2014  . S/P laparoscopic cholecystectomy 07/05/2011  . BREAST PAIN 05/19/2009  . CERVICAL STRAIN 05/19/2009  . ALLERGIC RHINITIS 06/22/2008  . Migraine 06/22/2008  . DEPRESSION, MAJOR, MODERATE 06/15/2008  . PARESTHESIA 06/15/2008  . PULMONARY NODULE 04/17/2007  . COUGH 04/17/2007  . Chest pain, atypical 04/17/2007  . MENORRHAGIA 07/02/2006  . NOCTURIA 04/26/2006    Past Surgical History:  Procedure Laterality Date  . CHOLECYSTECTOMY  06/09/2011   Procedure: LAPAROSCOPIC CHOLECYSTECTOMY;  Surgeon: Liz MaladyBurke E Thompson, MD;  [facility];  Service: General;  Laterality: N/A;  . DILATION AND CURETTAGE OF UTERUS    . ERCP  06/08/2011   Procedure: ENDOSCOPIC RETROGRADE  CHOLANGIOPANCREATOGRAPHY (ERCP);  Surgeon: Theda BelfastPatrick D Hung, MD;  [facility];  Service: Endoscopy;  Laterality: N/A;  . TONSILLECTOMY      OB History    No data available       Home Medications    Prior to Admission medications   Medication Sig Start Date End Date Taking? Authorizing Provider  Aspirin-Acetaminophen-Caffeine (GOODYS EXTRA STRENGTH) [phone] MG PACK Take 1 packet by mouth 3 (three) times daily as needed (for pain).   Yes Historical Provider, MD  Aspirin-Salicylamide-Caffeine (BC HEADACHE POWDER PO) Take 1 packet by mouth 3 (three) times daily as needed (for pain).   Yes Historical Provider, MD  polyethylene glycol powder (GLYCOLAX/MIRALAX) powder Take 17 g by mouth 2 (two) times daily as needed. Patient taking differently: Take 17 g by mouth 2 (two) times daily as needed for mild constipation.  12/20/15  Yes Doyle AskewKimberly Stephenia Harris, FNP  traMADol (ULTRAM) 50 MG tablet Take 1 tablet (50 mg total) by mouth 3 (three) times daily. Patient taking differently: Take 50 mg by mouth every 6 (six) hours as needed for moderate pain.  12/02/15  Yes Kirstie PeriNorman S Regal, DPM  butalbital-acetaminophen-caffeine (FIORICET, ESGIC) 50-325-40 MG tablet Take 1 tablet by mouth every 6 (six) hours as needed for headache. 02/02/16 02/01/17  Shaune Pollackameron Paxon Propes, MD  ipratropium (ATROVENT) 0.03 % nasal spray Place 2 sprays into both nostrils 2 (two) times daily. Patient not taking: Reported on 02/02/2016 12/20/15   Doyle AskewKimberly Stephenia Harris, FNP  ondansetron (ZOFRAN-ODT) 4 MG  disintegrating tablet Take 1 tablet (4 mg total) by mouth every 8 (eight) hours as needed for nausea. Patient not taking: Reported on 02/02/2016 12/20/15   Doyle Askew, FNP  ranitidine (ZANTAC) 150 MG tablet Take 1 tablet (150 mg total) by mouth 2 (two) times daily. Patient not taking: Reported on 02/02/2016 12/20/15   Doyle Askew, FNP  traMADol (ULTRAM) 50 MG tablet Take 1 tablet (50 mg total) by mouth  3 (three) times daily. Patient not taking: Reported on 02/02/2016 11/04/15   Lenn Sink, DPM  traMADol (ULTRAM) 50 MG tablet Take 1 tablet (50 mg total) by mouth 3 (three) times daily. Patient not taking: Reported on 02/02/2016 12/02/15   Lenn Sink, DPM    Family History Family History  Problem Relation Age of Onset  . Diabetes Mother   . Heart disease Mother     AMI  . Heart failure Father   . Heart disease Father     CHF with defibrillator.  . Hypertension Sister   . Polycystic ovary syndrome Daughter     Social History Social History  Substance Use Topics  . Smoking status: Never Smoker  . Smokeless tobacco: Never Used  . Alcohol use Yes     Comment: occasionally, 1/month     Allergies   Bactrim [sulfamethoxazole-trimethoprim]   Review of Systems Review of Systems  Constitutional: Positive for fatigue. Negative for chills and fever.  HENT: Negative for congestion, rhinorrhea and sore throat.   Eyes: Negative for visual disturbance.  Respiratory: Negative for cough, shortness of breath and wheezing.   Cardiovascular: Negative for chest pain and leg swelling.  Gastrointestinal: Negative for abdominal pain, diarrhea, nausea and vomiting.  Genitourinary: Negative for dysuria, flank pain, vaginal bleeding and vaginal discharge.  Musculoskeletal: Negative for neck pain.  Skin: Negative for rash.  Allergic/Immunologic: Negative for immunocompromised state.  Neurological: Positive for headaches. Negative for syncope.  Hematological: Does not bruise/bleed easily.  All other systems reviewed and are negative.    Physical Exam Updated Vital Signs BP 134/91 (BP Location: Right Arm)   Pulse 92   Temp 98.4 F (36.9 C) (Oral)   Resp 16   SpO2 99%   Physical Exam  Constitutional: She is oriented to person, place, and time. She appears well-developed and well-nourished. No distress.  HENT:  Head: Normocephalic and atraumatic.  Eyes: Conjunctivae are normal.    Neck: Neck supple.  Cardiovascular: Normal rate, regular rhythm and normal heart sounds.  Exam reveals no friction rub.   No murmur heard. Pulmonary/Chest: Effort normal and breath sounds normal. No respiratory distress. She has no wheezes. She has no rales.  Abdominal: She exhibits no distension.  Musculoskeletal: She exhibits no edema.  Neurological: She is alert and oriented to person, place, and time. She exhibits normal muscle tone.  Skin: Skin is warm. Capillary refill takes less than 2 seconds.  Psychiatric: She has a normal mood and affect.  Nursing note and vitals reviewed.   Neurological Exam:  Mental Status: Alert and oriented to person, place, and time. Attention and concentration normal. Speech clear. Recent memory is intact. Cranial Nerves: Visual fields intact to confrontation in all quadrants bilaterally. EOMI and PERRLA. No nystagmus noted. Facial sensation intact at forehead, maxillary cheek, and chin/mandible bilaterally. No weakness of masticatory muscles. No facial asymmetry or weakness. Hearing grossly normal to finer rub. Uvula is midline, and palate elevates symmetrically. Normal SCM and trapezius strength. Tongue midline without fasciculations Motor: Muscle strength 5/5 in proximal  and distal UE and LE bilaterally. No pronator drift. Muscle tone normal. Reflexes: 2+ and symmetrical in all four extremities.  Sensation: Intact to light touch in upper and lower extremities distally bilaterally.  Gait: Normal without ataxia. Coordination: Normal FTN bilaterally.    ED Treatments / Results  Labs (all labs ordered are listed, but only abnormal results are displayed) Labs Reviewed - No data to display  EKG  EKG Interpretation None       Radiology No results found.  Procedures Procedures (including critical care time)  Medications Ordered in ED Medications  sodium chloride 0.9 % bolus 1,000 mL (0 mLs Intravenous Stopped 02/02/16 2040)  dexamethasone  (DECADRON) injection 10 mg (10 mg Intravenous Given 02/02/16 1954)  ketorolac (TORADOL) 15 MG/ML injection 15 mg (15 mg Intravenous Given 02/02/16 1955)  prochlorperazine (COMPAZINE) injection 10 mg (10 mg Intravenous Given 02/02/16 1955)  diphenhydrAMINE (BENADRYL) injection 25 mg (25 mg Intravenous Given 02/02/16 1955)     Initial Impression / Assessment and Plan / ED Course  I have reviewed the triage vital signs and the nursing notes.  Pertinent labs & imaging results that were available during my care of the patient were reviewed by me and considered in my medical decision making (see chart for details).  Clinical Course     50 year old female with past medical history of chronic headaches who presents with dull, generalized, bandlike headache. Suspect migraine versus tension type headache versus analgesic rebound. No red flags. She has no fevers, neck stiffness, photophobia, phonophobia, or evidence to suggest meningitis or encephalitis. The headache began gradually and is similar to usual headaches I do not suspect subarachnoid. Her neurological exam is nonfocal with no evidence of intracranial mass or lesion. Symptoms are improved with migraine cocktail in the ED. Will discharge with outpatient follow-up and good return precautions.  Final Clinical Impressions(s) / ED Diagnoses   Final diagnoses:  Acute non intractable tension-type headache    New Prescriptions Discharge Medication List as of 02/02/2016  8:53 PM    START taking these medications   Details  butalbital-acetaminophen-caffeine (FIORICET, ESGIC) 50-325-40 MG tablet Take 1 tablet by mouth every 6 (six) hours as needed for headache., Starting Wed 02/02/2016, Until Thu 02/01/2017, Print         Shaune Pollackameron Jmari Pelc, MD 02/03/16 1334

## 2016-02-02 NOTE — ED Triage Notes (Signed)
Per pt, states headache for 2 weeks-states she quit going to see her neurologist-states she wants something to get rid of pain

## 2016-02-10 DIAGNOSIS — J069 Acute upper respiratory infection, unspecified: Secondary | ICD-10-CM | POA: Diagnosis not present

## 2016-02-10 DIAGNOSIS — L299 Pruritus, unspecified: Secondary | ICD-10-CM | POA: Diagnosis not present

## 2016-02-10 DIAGNOSIS — Z23 Encounter for immunization: Secondary | ICD-10-CM | POA: Diagnosis not present

## 2016-02-15 DIAGNOSIS — F3340 Major depressive disorder, recurrent, in remission, unspecified: Secondary | ICD-10-CM | POA: Diagnosis not present

## 2016-02-15 DIAGNOSIS — F411 Generalized anxiety disorder: Secondary | ICD-10-CM | POA: Diagnosis not present

## 2016-02-15 DIAGNOSIS — F4310 Post-traumatic stress disorder, unspecified: Secondary | ICD-10-CM | POA: Diagnosis not present

## 2016-03-01 ENCOUNTER — Ambulatory Visit (INDEPENDENT_AMBULATORY_CARE_PROVIDER_SITE_OTHER): Payer: BLUE CROSS/BLUE SHIELD | Admitting: Neurology

## 2016-03-01 ENCOUNTER — Encounter: Payer: Self-pay | Admitting: Neurology

## 2016-03-01 VITALS — BP 118/68 | HR 87 | Ht 62.0 in | Wt 167.8 lb

## 2016-03-01 DIAGNOSIS — G43019 Migraine without aura, intractable, without status migrainosus: Secondary | ICD-10-CM | POA: Diagnosis not present

## 2016-03-01 DIAGNOSIS — G4710 Hypersomnia, unspecified: Secondary | ICD-10-CM

## 2016-03-01 MED ORDER — NAPROXEN 500 MG PO TABS
500.0000 mg | ORAL_TABLET | Freq: Two times a day (BID) | ORAL | 2 refills | Status: DC | PRN
Start: 2016-03-01 — End: 2016-06-16

## 2016-03-01 MED ORDER — TOPIRAMATE 50 MG PO TABS: 50.0000 mg | ORAL_TABLET | Freq: Every day | ORAL | 2 refills | Status: DC

## 2016-03-01 MED ORDER — ELETRIPTAN HYDROBROMIDE 40 MG PO TABS: ORAL_TABLET | ORAL | 2 refills | Status: DC

## 2016-03-01 NOTE — Patient Instructions (Signed)
Migraine Recommendations: 1.  Start topiramate 50mg  at bedtime.  Call in 4 weeks with update and we can adjust dose if needed. 2.  Take eletriptan 40mg  with naproxen 500mg  at earliest onset of headache.  May repeat dose once after 2 hours if needed.  Do not exceed two doses in 24 hours. 3.  Stop Fioricet.  Limit use of pain relievers to no more than 2 days out of the week.  These medications include acetaminophen, ibuprofen, triptans and narcotics.  This will help reduce risk of rebound headaches. 4.  Be aware of common food triggers such as processed sweets, processed foods with nitrites (such as deli meat, hot dogs, sausages), foods with MSG, alcohol (such as wine), chocolate, certain cheeses, certain fruits (dried fruits, some citrus fruit), vinegar, diet soda. 4.  Avoid caffeine 5.  Routine exercise 6.  Proper sleep hygiene 7.  Stay adequately hydrated with water 8.  Keep a headache diary. 9.  Maintain proper stress management. 10.  Do not skip meals. 11.  Consider supplements:  Magnesium citrate 400mg  to 600mg  daily, riboflavin 400mg , Coenzyme Q 10 100mg  three times daily 12.  We will refer you to Sleep Medicine to evaluate sleep apnea

## 2016-03-01 NOTE — Progress Notes (Signed)
NEUROLOGY CONSULTATION NOTE  [name] MRN: [id number] DOB: 1965/07/04  Referring provider: ED referral Primary care provider: Dr. Patsy Lager  Reason for consult:  migraines  HISTORY OF PRESENT ILLNESS: [name] is a 51 year old right-handed female with plantar fasciitis who presents for headache.  History, including semiology and associated symptoms, are supplemented by ED note from 02/02/16.  Onset:  "many years" Location:  Across forehead and left sided Quality:  Pressure/pounding Intensity:  10/10 Aura:  no Prodrome:  no Associated symptoms:  Phonophobia, sometimes nausea.  No photophobia or vomiting. Usually occurs in clusters separated by headache-free periods of several months.  Current flare-up started over 2 months ago and has been constant and daily.  Last headache was 6 months ago. Triggers/exacerbating factors:  Loud noise, stress Relieving factors:  no Activity:  Able to function  Past NSAIDS:  ibuprofen, meloxicam, naproxen Past analgesics:  Goodys, Norco, Excedrin, Tylenol Past abortive triptans:  Sumatriptan tablet Past muscle relaxants:  Flexeril Past anti-emetic:  Zofran Past antihypertensive medications:  no Past antidepressant medications:  Prozac, venlafaxine XR 37.5mg , maybe nortriptyline many years ago for depression, Cymbalta (side effects)  Past anticonvulsant medications:  Gabapentin (for plantar fasciitis), topiramate 50mg  (maybe ineffective) Past vitamins/Herbal/Supplements:  no Other past therapies:  no  Current NSAIDS:  no Current analgesics:  Fioricet (second line if Maxalt ineffective), tramadol (sparingly for plantar fasciitis) Current triptans:  Maxalt 10mg  (reduces intensity from 10 to 5 for short while) Current anti-emetic:  no Current muscle relaxants:  no Current anti-anxiolytic:  no Current sleep aide:  no Current Antihypertensive medications:  no Current Antidepressant medications:  no Current Anticonvulsant  medications:  no Current Vitamins/Herbal/Supplements:  no Current Antihistamines/Decongestants:  Atrovent Other therapy:  no  Caffeine:  32 oz tea daily Alcohol:  seldom Smoker:  no Diet:  Does not drink enough water.  No soda. Exercise:  no Depression/stress:  Fairly stable Sleep hygiene:  Poor.  Sleeps throughout the night but wakes up tired.  She does snore. Family history of headache:  daughter  CT of head from 04/02/13 to evaluate headache was personally reviewed and revealed cerebellar tonsillar ectopia but no significant crowding at the foramen magnum.  CMP from 05/27/15:  Na 141, K 4.1, glucose 82, BUN 10, Cr 0.61, total bili 0.4, ALP 59, AST 16, ALT 16.  PAST MEDICAL HISTORY: Past Medical History:  Diagnosis Date  . Anxiety   . Chronic headaches   . Depression   . Gall stones   . Plantar fasciitis, bilateral     PAST SURGICAL HISTORY: Past Surgical History:  Procedure Laterality Date  . CHOLECYSTECTOMY  06/09/2011   Procedure: LAPAROSCOPIC CHOLECYSTECTOMY;  Surgeon: Liz Malady, MD;  Location: [facility] OR;  Service: General;  Laterality: N/A;  . DILATION AND CURETTAGE OF UTERUS    . ERCP  06/08/2011   Procedure: ENDOSCOPIC RETROGRADE CHOLANGIOPANCREATOGRAPHY (ERCP);  Surgeon: Theda Belfast, MD;  Location[facility] ENDOSCOPY;  Service: Endoscopy;  Laterality: N/A;  . TONSILLECTOMY      MEDICATIONS: Current Outpatient Prescriptions on File Prior to Visit  Medication Sig Dispense Refill  . butalbital-acetaminophen-caffeine (FIORICET, ESGIC) 50-325-40 MG tablet Take 1 tablet by mouth every 6 (six) hours as needed for headache. 16 tablet 0  . traMADol (ULTRAM) 50 MG tablet Take 1 tablet (50 mg total) by mouth 3 (three) times daily. 90 tablet 2  . Aspirin-Acetaminophen-Caffeine (GOODYS EXTRA STRENGTH) 500-325-65 MG PACK Take 1 packet by mouth 3 (three) times daily as needed (for  pain).    . Aspirin-Salicylamide-Caffeine (BC HEADACHE POWDER PO) Take 1 packet by mouth 3 (three)  times daily as needed (for pain).    Marland Kitchen ipratropium (ATROVENT) 0.03 % nasal spray Place 2 sprays into both nostrils 2 (two) times daily. (Patient not taking: Reported on 03/01/2016) 30 mL 0  . ondansetron (ZOFRAN-ODT) 4 MG disintegrating tablet Take 1 tablet (4 mg total) by mouth every 8 (eight) hours as needed for nausea. (Patient not taking: Reported on 03/01/2016) 10 tablet 0  . polyethylene glycol powder (GLYCOLAX/MIRALAX) powder Take 17 g by mouth 2 (two) times daily as needed. (Patient not taking: Reported on 03/01/2016) 3350 g 1  . ranitidine (ZANTAC) 150 MG tablet Take 1 tablet (150 mg total) by mouth 2 (two) times daily. (Patient not taking: Reported on 03/01/2016) 60 tablet 0   No current facility-administered medications on file prior to visit.     ALLERGIES: Allergies  Allergen Reactions  . Bactrim [Sulfamethoxazole-Trimethoprim] Rash and Other (See Comments)    Pt states she developed a rash on arms, throat soreness, headache and ear pain.     FAMILY HISTORY: Family History  Problem Relation Age of Onset  . Diabetes Mother   . Heart disease Mother     AMI  . Heart failure Father   . Heart disease Father     CHF with defibrillator.  . Hypertension Sister   . Polycystic ovary syndrome Daughter     SOCIAL HISTORY: Social History   Social History  . Marital status: Married    Spouse name: Loraine Leriche  . Number of children: N/A  . Years of education: 12th   Occupational History  . 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 Smoking status: Never Smoker  . Smokeless tobacco: Never Used  . Alcohol use Yes     Comment: occasionally, 1/month  . Drug use: No  . Sexual activity: Yes    Birth control/ protection: Injection, None     Comment: no menstruation in "years."   Other Topics Concern  . Not on file   Social History Narrative   Marital status: Married      Children: 3 children      Lives: with daughter and husband      Employment:  works at [employer] in [employer]      Tobacco; none      Alcohol:  Socially; weekends.      Drugs: none     Exercise: none    REVIEW OF SYSTEMS: Constitutional: No fevers, chills, or sweats, no generalized fatigue, change in appetite Eyes: No visual changes, double vision, eye pain Ear, nose and throat: No hearing loss, ear pain, nasal congestion, sore throat Cardiovascular: No chest pain, palpitations Respiratory:  No shortness of breath at rest or with exertion, wheezes GastrointestinaI: No nausea, vomiting, diarrhea, abdominal pain, fecal incontinence Genitourinary:  No dysuria, urinary retention or frequency Musculoskeletal:  No neck pain, back pain Integumentary: No rash, pruritus, skin lesions Neurological: as above Psychiatric: No depression, insomnia, anxiety Endocrine: No palpitations, fatigue, diaphoresis, mood swings, change in appetite, change in weight, increased thirst Hematologic/Lymphatic:  No purpura, petechiae. Allergic/Immunologic: no itchy/runny eyes, nasal congestion, recent allergic reactions, rashes  PHYSICAL EXAM: Vitals:   03/01/16 1244  BP: 118/68  Pulse: 87   General: No acute distress.  Patient appears well-groomed.  Head:  Normocephalic/atraumatic Eyes:  fundi examined but not visualized Neck: supple, no paraspinal tenderness, full range of  motion Back: No paraspinal tenderness Heart: regular rate and rhythm Lungs: Clear to auscultation bilaterally. Vascular: No carotid bruits. Neurological Exam: Mental status: alert and oriented to person, place, and time, recent and remote memory intact, fund of knowledge intact, attention and concentration intact, speech fluent and not dysarthric, language intact. Cranial nerves: CN I: not tested CN II: pupils equal, round and reactive to light, visual fields intact CN III, IV, VI:  full range of motion, no nystagmus, no ptosis CN V: facial sensation intact CN VII: upper and lower face symmetric CN VIII:  hearing intact CN IX, X: gag intact, uvula midline CN XI: sternocleidomastoid and trapezius muscles intact CN XII: tongue midline Bulk & Tone: normal, no fasciculations. Motor:  5/5 throughout  Sensation: temperature and vibration sensation intact. Deep Tendon Reflexes:  2+ throughout, toes downgoing.  Finger to nose testing:  Without dysmetria.  Heel to shin:  Without dysmetria.  Gait:  Normal station and stride.  Able to turn and tandem walk. Romberg negative.  IMPRESSION: Intractable migraine Consider possible OSA:  With daily headaches, she is fatigued despite sleeping through the night and does snore. Depression.   PLAN: 1.  She actually has only been on low doses of topiramate and venlafaxine, so they are both still options.  We will retry topiramate 50mg  at bedtime.  We can increase dose in 4 weeks if needed.  Side effects discussed. 2. Stop Fioricet.  For abortive therapy, will try Relpax 40mg  to take with naproxen 500mg .  It may not be possible to effectively abort the headache completely if she is having a daily persistent headache. 3.  Discussed lifestyle modification:  Hydration, exercise, diet, stress reduction.   4.  Refer for evaluation of sleep apnea.  Thank you for allowing me to take part in the care of this patient.  Shon MilletAdam Jaffe, DO  CC:  Abbe AmsterdamJessica Copland, MD

## 2016-03-01 NOTE — Addendum Note (Signed)
Addended by: Doree BarthelLOWE, Keandra Medero on: 03/01/2016 03:33 PM   Modules accepted: Orders

## 2016-03-02 ENCOUNTER — Ambulatory Visit (INDEPENDENT_AMBULATORY_CARE_PROVIDER_SITE_OTHER): Payer: BLUE CROSS/BLUE SHIELD | Admitting: Podiatry

## 2016-03-02 ENCOUNTER — Telehealth: Payer: Self-pay | Admitting: Neurology

## 2016-03-02 DIAGNOSIS — M722 Plantar fascial fibromatosis: Secondary | ICD-10-CM | POA: Diagnosis not present

## 2016-03-02 MED ORDER — ZOLMITRIPTAN 5 MG PO TABS: 5.0000 mg | ORAL_TABLET | ORAL | 0 refills | Status: DC | PRN

## 2016-03-02 MED ORDER — TRIAMCINOLONE ACETONIDE 10 MG/ML IJ SUSP
10.0000 mg | Freq: Once | INTRAMUSCULAR | Status: AC
  Administered 2016-03-02: 10 mg

## 2016-03-02 NOTE — Telephone Encounter (Signed)
She can try Zomig 5mg  tablet (take at earliest onset of headache and may repeat once after 2 hours if needed, not to exceed 2 tablets in 24 hours).  I am not aware of which medications would be more affordable under her insurance.  If this is too expensive, we can always try sumatriptan again, or continue Maxalt (as it has been somewhat beneficial).

## 2016-03-02 NOTE — Telephone Encounter (Signed)
Please advise 

## 2016-03-02 NOTE — Telephone Encounter (Signed)
Spoke to patient. Patient is willing to start Zomig. Went over instructions. Patient verbalized understanding. Sent Rx to pharmacy.

## 2016-03-02 NOTE — Telephone Encounter (Signed)
PT called and said Dr Everlena CooperJaffe called in a prescription for Eletriptan and she went to pick up at the pharmacy and it is to expensive and wanted to know if there was something cheaper/Dawn CB# [phone] or [phone]

## 2016-03-03 NOTE — Progress Notes (Signed)
Subjective:     Patient ID: [name], female   DOB: 03/06/1965, 51 y.o.   MRN: [id number]  HPI patient states that she has started to develop a lot of pain in the plantar of her right heel and it's making it hard to walk   Review of Systems     Objective:   Physical Exam Neurovascular status intact muscle strength adequate with inflammation pain plantar aspect right heel at the insertional point of tendon into the calcaneus with fluid buildup noted. Patient does have good digital perfusion    Assessment:     Plantar fasciitis right heel acute in nature    Plan:     H&P condition reviewed and today I reviewed x-rays. Injected the plantar fascial right 3 mg Kenalog 5 mg Xylocaine and applied fascial brace with instructions on usage and reappoint in the next several weeks for reevaluation her for new orthotics

## 2016-03-07 DIAGNOSIS — F4310 Post-traumatic stress disorder, unspecified: Secondary | ICD-10-CM | POA: Diagnosis not present

## 2016-03-07 DIAGNOSIS — F3340 Major depressive disorder, recurrent, in remission, unspecified: Secondary | ICD-10-CM | POA: Diagnosis not present

## 2016-03-07 DIAGNOSIS — F411 Generalized anxiety disorder: Secondary | ICD-10-CM | POA: Diagnosis not present

## 2016-03-16 ENCOUNTER — Ambulatory Visit: Payer: BLUE CROSS/BLUE SHIELD | Admitting: Podiatry

## 2016-03-28 DIAGNOSIS — F4310 Post-traumatic stress disorder, unspecified: Secondary | ICD-10-CM | POA: Diagnosis not present

## 2016-03-28 DIAGNOSIS — F3340 Major depressive disorder, recurrent, in remission, unspecified: Secondary | ICD-10-CM | POA: Diagnosis not present

## 2016-03-28 DIAGNOSIS — F411 Generalized anxiety disorder: Secondary | ICD-10-CM | POA: Diagnosis not present

## 2016-04-06 ENCOUNTER — Encounter: Payer: Self-pay | Admitting: Pulmonary Disease

## 2016-04-06 ENCOUNTER — Ambulatory Visit (INDEPENDENT_AMBULATORY_CARE_PROVIDER_SITE_OTHER): Payer: BLUE CROSS/BLUE SHIELD | Admitting: Pulmonary Disease

## 2016-04-06 VITALS — BP 114/78 | HR 71 | Ht 62.0 in | Wt 162.2 lb

## 2016-04-06 DIAGNOSIS — G4710 Hypersomnia, unspecified: Secondary | ICD-10-CM | POA: Insufficient documentation

## 2016-04-06 NOTE — Assessment & Plan Note (Signed)
She has chronic hypersomnia, chronic fatigue, this affects her functionality. She has snoring, gasping, choking, unrefreshed sleep, frequent awakenings. She naps almost daily. Despite napping at this afternoon, she is able to sleep at night.  She works at [employer], 6am -2 pm. There is symptomatic at work.  ESS 23. Has a matter fact, she was on the phone last night and fell asleep talking to someone.   She does sleep talking. No other abnormal behavior and sleep.   Plan : We discussed about the diagnosis of Obstructive Sleep Apnea (OSA) and implications of untreated OSA. We discussed about CPAP and BiPaP as possible treatment options.    We will schedule the patient for a sleep study. Plan for a split-night sleep study. Likely very severe. Hopefully better with CPAP, may need BiPAP. Very symptomatic.    Patient was instructed to call the office if he/she has not heard back from the office 1-2 weeks after the sleep study.   Patient was instructed to call the office if he/she is having issues with the PAP device.   We discussed good sleep hygiene.   Patient was advised not to engage in activities requiring concentration and/or vigilance if he/she is sleepy.  Patient was advised not to drive if he/she is sleepy.

## 2016-04-06 NOTE — Progress Notes (Signed)
Subjective:    Patient ID: [name], female    DOB: 07/05/1965, 51 y.o.   MRN: [id number]  HPI   This is the case of [name], 51 y.o. Female, who was referred by Dr. Shon MilletAdam Jaffe  in consultation regarding possible OSA.   As you very well know, patient is a non smoker, not known to have asthma or copd.   Patient has chronic migraine for which she sees a neurologist.  She has chronic hypersomnia, chronic fatigue, this affects her functionality. She has snoring, gasping, choking, unrefreshed sleep, frequent awakenings. She naps almost daily. Despite napping at this afternoon, she is able to sleep at night.  She works at [employer], 6am -2 pm. There is symptomatic at work.  ESS 23. Has a matter fact, she was on the phone last night and fell asleep talking to someone.   She does sleep talking. No other abnormal behavior and sleep.    Review of Systems  Constitutional: Negative for fever and unexpected weight change.  HENT: Negative for congestion, dental problem, ear pain, nosebleeds, postnasal drip, rhinorrhea, sinus pressure, sneezing, sore throat and trouble swallowing.        Chronic migraine  Eyes: Negative for redness and itching.  Respiratory: Negative for cough, chest tightness, shortness of breath and wheezing.   Cardiovascular: Negative for palpitations and leg swelling.  Gastrointestinal: Negative for nausea and vomiting.  Genitourinary: Negative for dysuria.  Musculoskeletal: Negative for joint swelling.  Skin: Negative for rash.  Neurological: Negative for headaches.  Hematological: Does not bruise/bleed easily.  Psychiatric/Behavioral: Negative for dysphoric mood. The patient is not nervous/anxious.    Past Medical History:  Diagnosis Date  . Anxiety   . Chronic headaches   . Depression   . Gall stones   . Plantar fasciitis, bilateral    (-) CAD, HTN, DVT3  Family History  Problem Relation Age of Onset  . Diabetes Mother   . Heart disease  Mother     AMI  . Heart failure Father   . Heart disease Father     CHF with defibrillator.  . Hypertension Sister   . Polycystic ovary syndrome Daughter      Past Surgical History:  Procedure Laterality Date  . CHOLECYSTECTOMY  06/09/2011   Procedure: LAPAROSCOPIC CHOLECYSTECTOMY;  Surgeon: Liz MaladyBurke E Thompson, MD;  [facility];  Service: General;  Laterality: N/A;  . DILATION AND CURETTAGE OF UTERUS    . ERCP  06/08/2011   Procedure: ENDOSCOPIC RETROGRADE CHOLANGIOPANCREATOGRAPHY (ERCP);  Surgeon: Theda BelfastPatrick D Hung, MD;  [facility];  Service: Endoscopy;  Laterality: N/A;  . TONSILLECTOMY      Social History   Social History  . Marital status: Married    Spouse name: [name]  . Number of children: N/A  . Years of education: 12th   Occupational History  . 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 Smoking status: Never Smoker  . Smokeless tobacco: Never Used  . Alcohol use Yes     Comment: occasionally, 1/month  . Drug use: No  . Sexual activity: Yes    Birth control/ protection: Injection, None     Comment: no menstruation in "years."   Other Topics Concern  . Not on file   Social History Narrative   Marital status: Married      Children: 3 children      Lives: with daughter and husband  Employment: works at [employer] in [employer]      Tobacco; none      Alcohol:  Socially; weekends.      Drugs: none     Exercise: none     Allergies  Allergen Reactions  . Bactrim [Sulfamethoxazole-Trimethoprim] Rash and Other (See Comments)    Pt states she developed a rash on arms, throat soreness, headache and ear pain.      Outpatient Medications Prior to Visit  Medication Sig Dispense Refill  . Aspirin-Acetaminophen-Caffeine (GOODYS EXTRA STRENGTH) 500-325-65 MG PACK Take 1 packet by mouth 3 (three) times daily as needed (for pain).    . Aspirin-Salicylamide-Caffeine (BC HEADACHE POWDER PO) Take 1 packet by mouth 3 (three)  times daily as needed (for pain).    . naproxen (NAPROSYN) 500 MG tablet Take 1 tablet (500 mg total) by mouth every 12 (twelve) hours as needed. 16 tablet 2  . rizatriptan (MAXALT-MLT) 10 MG disintegrating tablet Take 10 mg by mouth as needed for migraine. May repeat in 2 hours if needed    . topiramate (TOPAMAX) 50 MG tablet Take 1 tablet (50 mg total) by mouth at bedtime. 30 tablet 2  . traMADol (ULTRAM) 50 MG tablet Take 1 tablet (50 mg total) by mouth 3 (three) times daily. 90 tablet 2  . zolmitriptan (ZOMIG) 5 MG tablet Take 1 tablet (5 mg total) by mouth as needed for migraine. 10 tablet 0  . ipratropium (ATROVENT) 0.03 % nasal spray Place 2 sprays into both nostrils 2 (two) times daily. (Patient not taking: Reported on 03/01/2016) 30 mL 0  . ondansetron (ZOFRAN-ODT) 4 MG disintegrating tablet Take 1 tablet (4 mg total) by mouth every 8 (eight) hours as needed for nausea. (Patient not taking: Reported on 03/01/2016) 10 tablet 0  . polyethylene glycol powder (GLYCOLAX/MIRALAX) powder Take 17 g by mouth 2 (two) times daily as needed. (Patient not taking: Reported on 03/01/2016) 3350 g 1  . ranitidine (ZANTAC) 150 MG tablet Take 1 tablet (150 mg total) by mouth 2 (two) times daily. (Patient not taking: Reported on 03/01/2016) 60 tablet 0   No facility-administered medications prior to visit.    No orders of the defined types were placed in this encounter.        Objective:   Physical Exam  Vitals:  Vitals:   04/06/16 1602  BP: 114/78  Pulse: 71  SpO2: 100%  Weight: 162 lb 3.2 oz (73.6 kg)  Height: 5\' 2"  (1.575 m)    Constitutional/General:  Pleasant, well-nourished, well-developed, not in any distress,  Comfortably seating.  Well kempt  Body mass index is 29.67 kg/m. Wt Readings from Last 3 Encounters:  04/06/16 162 lb 3.2 oz (73.6 kg)  03/01/16 167 lb 12.8 oz (76.1 kg)  12/20/15 167 lb (75.8 kg)     HEENT: Pupils equal and reactive to light and accommodation. Anicteric  sclerae. Normal nasal mucosa.   No oral  lesions,  mouth clear,  oropharynx clear, no postnasal drip. (-) Oral thrush. No dental caries. Mild retrognathia.  Airway - Mallampati class IV  Neck: No masses. Midline trachea. No JVD, (-) LAD. (-) bruits appreciated.  Respiratory/Chest: Grossly normal chest. (-) deformity. (-) Accessory muscle use.  Symmetric expansion. (-) Tenderness on palpation.  Resonant on percussion.  Diminished BS on both lower lung zones. (-) wheezing, crackles, rhonchi (-) egophony  Cardiovascular: Regular rate and  rhythm, heart sounds normal, no murmur or gallops, no peripheral edema  Gastrointestinal:  Normal bowel sounds. Soft,  non-tender. No hepatosplenomegaly.  (-) masses.   Musculoskeletal:  Normal muscle tone. Normal gait.   Extremities: Grossly normal. (-) clubbing, cyanosis.  (-) edema  Skin: (-) rash,lesions seen.   Neurological/Psychiatric : alert, oriented to time, place, person. Normal mood and affect           Assessment & Plan:  Hypersomnia She has chronic hypersomnia, chronic fatigue, this affects her functionality. She has snoring, gasping, choking, unrefreshed sleep, frequent awakenings. She naps almost daily. Despite napping at this afternoon, she is able to sleep at night.  She works at [employer], 6am -2 pm. There is symptomatic at work.  ESS 23. Has a matter fact, she was on the phone last night and fell asleep talking to someone.   She does sleep talking. No other abnormal behavior and sleep.   Plan : We discussed about the diagnosis of Obstructive Sleep Apnea (OSA) and implications of untreated OSA. We discussed about CPAP and BiPaP as possible treatment options.    We will schedule the patient for a sleep study. Plan for a split-night sleep study. Likely very severe. Hopefully better with CPAP, may need BiPAP. Very symptomatic.    Patient was instructed to call the office if he/she has not heard back from the office 1-2  weeks after the sleep study.   Patient was instructed to call the office if he/she is having issues with the PAP device.   We discussed good sleep hygiene.   Patient was advised not to engage in activities requiring concentration and/or vigilance if he/she is sleepy.  Patient was advised not to drive if he/she is sleepy.     Thank you very much for letting me participate in this patient's care. Please do not hesitate to give me a call if you have any questions or concerns regarding the treatment plan.   Patient will follow up with me in 8-10 weeks.    [name], MD 04/06/2016   4:41 PM Pulmonary and Critical Care Medicine [facility] Pager: [phone] Office: [phone], Fax: [phone]

## 2016-04-06 NOTE — Patient Instructions (Signed)
It was a pleasure taking care of you today!  We will schedule you to have a sleep study to determine if you have sleep apnea.     We will get a lab sleep study.  You will be scheduled to have a lab sleep study in 4-6 weeks.  Someone from the sleep lab will call you in 2-3 days to schedule the study with you.  They usually have cancellations every night so most likely, they will have openings for a lab sleep study next week or so.  We encourage you to do your sleep study then if possible. Please give us a call in a week is no one from the sleep lab calls you in 2-3 days.   If the sleep study is positive, we will order you a CPAP  machine.  Please call the office if you do NOT receive your machine in the next 1-2 weeks.   Please make sure you use your CPAP device everytime you sleep.  We will monitor the usage of your machine per your insurance requirement.  Your insurance company may take the machine from you if you are not using it regularly.   Please clean the mask, tubings, filter, water reservoir with soapy water every week.  Please use distilled water for the water reservoir.   Please call the office or your machine provider (DME company) if you are having issues with the device.   Return to clinic in 8-10 weeks with Dr. De Dios or NP    

## 2016-04-11 DIAGNOSIS — F3340 Major depressive disorder, recurrent, in remission, unspecified: Secondary | ICD-10-CM | POA: Diagnosis not present

## 2016-04-11 DIAGNOSIS — F4310 Post-traumatic stress disorder, unspecified: Secondary | ICD-10-CM | POA: Diagnosis not present

## 2016-04-11 DIAGNOSIS — F411 Generalized anxiety disorder: Secondary | ICD-10-CM | POA: Diagnosis not present

## 2016-04-19 ENCOUNTER — Ambulatory Visit: Payer: Self-pay | Admitting: Neurology

## 2016-05-02 DIAGNOSIS — F3340 Major depressive disorder, recurrent, in remission, unspecified: Secondary | ICD-10-CM | POA: Diagnosis not present

## 2016-05-02 DIAGNOSIS — F4310 Post-traumatic stress disorder, unspecified: Secondary | ICD-10-CM | POA: Diagnosis not present

## 2016-05-02 DIAGNOSIS — F411 Generalized anxiety disorder: Secondary | ICD-10-CM | POA: Diagnosis not present

## 2016-05-20 ENCOUNTER — Other Ambulatory Visit: Payer: Self-pay | Admitting: Neurology

## 2016-05-22 ENCOUNTER — Encounter: Payer: Self-pay | Admitting: Podiatry

## 2016-05-22 ENCOUNTER — Ambulatory Visit: Payer: BLUE CROSS/BLUE SHIELD | Admitting: Podiatry

## 2016-05-22 ENCOUNTER — Ambulatory Visit (INDEPENDENT_AMBULATORY_CARE_PROVIDER_SITE_OTHER): Payer: BLUE CROSS/BLUE SHIELD | Admitting: Podiatry

## 2016-05-22 DIAGNOSIS — M722 Plantar fascial fibromatosis: Secondary | ICD-10-CM | POA: Diagnosis not present

## 2016-05-22 MED ORDER — TRIAMCINOLONE ACETONIDE 10 MG/ML IJ SUSP
10.0000 mg | Freq: Once | INTRAMUSCULAR | Status: AC
  Administered 2016-05-22: 10 mg

## 2016-05-23 ENCOUNTER — Ambulatory Visit (HOSPITAL_BASED_OUTPATIENT_CLINIC_OR_DEPARTMENT_OTHER): Payer: BLUE CROSS/BLUE SHIELD | Attending: Pulmonary Disease | Admitting: Pulmonary Disease

## 2016-05-23 DIAGNOSIS — R0683 Snoring: Secondary | ICD-10-CM | POA: Insufficient documentation

## 2016-05-23 DIAGNOSIS — G4710 Hypersomnia, unspecified: Secondary | ICD-10-CM | POA: Diagnosis present

## 2016-05-23 DIAGNOSIS — G4733 Obstructive sleep apnea (adult) (pediatric): Secondary | ICD-10-CM | POA: Insufficient documentation

## 2016-05-24 NOTE — Progress Notes (Signed)
Subjective:     Patient ID: [name], female   DOB: 06/21/1965, 51 y.o.   MRN: [id number]  HPI patient states my right heel has been hurting a lot and making it difficult to walk comfortably   Review of Systems     Objective:   Physical Exam Neurovascular status intact with flareup of the right plantar fashion insertional point tendon calcaneus    Assessment:     Acute fasciitis right with inflammation    Plan:     Reinjected the plantar fascial right 3 mg Kenalog 5 mg Xylocaine which was tolerated well reappoint to recheck

## 2016-05-27 ENCOUNTER — Telehealth: Payer: Self-pay | Admitting: Pulmonary Disease

## 2016-05-27 DIAGNOSIS — G4710 Hypersomnia, unspecified: Secondary | ICD-10-CM

## 2016-05-27 DIAGNOSIS — G4733 Obstructive sleep apnea (adult) (pediatric): Secondary | ICD-10-CM

## 2016-05-27 NOTE — Telephone Encounter (Signed)
    Please call the pt and tell the pt the LAB SLEEP STUDY  showed OSA  Pt stops breathing  12  times an hour.    Please order autoCPAP 5-10 cm H20 with an extra small Resmed Airfit F10 FFM.  Patient will need a 1 month download.   Patient needs to be seen by me or any of the NPs/APPs  4-6 weeks after obtaining the cpap machine. Let me know if you receive this.   Thanks!   J. Alexis Frock, MD 05/27/2016, 8:48 PM

## 2016-05-27 NOTE — Procedures (Signed)
NAME: [name] DATE OF BIRTH:  [birth year] MEDICAL RECORD NUMBER [id number]  LOCATION: [facility]  PHYSICIAN: Daneen Schick Dios  DATE OF STUDY: 05/23/2016  SLEEP STUDY TYPE: Out of Center Sleep Test                CLINICAL INFORMATION  Sleep Study Type: NPSG  Indication for sleep study: Excessive Daytime Sleepiness, Fatigue, Morning Headaches, Snoring, Witnessed Apneas  Epworth Sleepiness Score: 18   SLEEP STUDY TECHNIQUE  As per the AASM Manual for the Scoring of Sleep and Associated Events v2.3 (April 2016) with a hypopnea requiring 4% desaturations.  The channels recorded and monitored were frontal, central and occipital EEG, electrooculogram (EOG), submentalis EMG (chin), nasal and oral airflow, thoracic and abdominal wall motion, anterior tibialis EMG, snore microphone, electrocardiogram, and pulse oximetry.   MEDICATIONS  Medications self-administered by patient taken the night of the study : TOPIRAMATE. Meds reviewed per chart review done.  SLEEP ARCHITECTURE  The study was initiated at 10:00:01 PM and ended at 4:56:48 AM.  Sleep onset time was 63.0 minutes and the sleep efficiency was 78.6%. The total sleep time was 327.5 minutes.  Stage REM latency was 99.0 minutes.  The patient spent 3.21% of the night in stage N1 sleep, 72.82% in stage N2 sleep, 0.00% in stage N3 and 23.97% in REM.  Alpha intrusion was absent.  Supine sleep was 61.64%.   RESPIRATORY PARAMETERS  The overall apnea/hypopnea index (AHI) was 12.3 per hour. There were 20 total apneas, including 16 obstructive, 4 central and 0 mixed apneas. There were 47 hypopneas and 10 RERAs.  The AHI during Stage REM sleep was 25.2 per hour. AHI while supine was 17.5 per hour.  The mean oxygen saturation was 94.78%. The minimum SpO2 during sleep was 82.00%.  Moderate snoring was noted during this study.  CARDIAC DATA  The 2 lead EKG demonstrated sinus rhythm. The mean heart rate was  66.49 beats per minute. Other EKG findings include: None.   LEG MOVEMENT DATA  The total PLMS were 0 with a resulting PLMS index of 0.00. Associated arousal with leg movement index was 0.0 .  IMPRESSIONS  1. Mild obstructive sleep apnea occurred during this study (AHI = 12.3/h). 2. No significant central sleep apnea occurred during this study (CAI = 0.7/h). 3. Mild oxygen desaturation was noted during this study (Min O2 = 82.00%). 4. The patient snored with Moderate snoring volume. 5. No cardiac abnormalities were noted during this study. 6. Clinically significant periodic limb movements did not occur during sleep. No significant associated arousals.  DIAGNOSIS  1. Obstructive Sleep Apnea (327.23 [G47.33 ICD-10])  RECOMMENDATIONS  1. Therapeutic CPAP titration to determine optimal pressure required to alleviate sleep disordered breathing. Alternatively, may try patient on autocpap 5-15 cm water with an extra small Resmed Airfit F10 FFM. Patient will need a 1 month download to determine cpap efficacy. 2. Positional therapy avoiding supine position during sleep. 3. Avoid alcohol, sedatives and other CNS depressants that may worsen sleep apnea and disrupt normal sleep architecture. 4. Sleep hygiene should be reviewed to assess factors that may improve sleep quality. 5. Weight management and regular exercise should be initiated or continued if appropriate. 6. Follow up in the office 4-6 weeks after obtaining cpap machine.   Roberta Meyer, MD 05/27/2016, 8:46 PM [place] [facility] Pager [phone] After 3 pm or if no answer, call [phone]

## 2016-05-29 DIAGNOSIS — G4733 Obstructive sleep apnea (adult) (pediatric): Secondary | ICD-10-CM | POA: Insufficient documentation

## 2016-05-29 NOTE — Telephone Encounter (Signed)
[phone] pt calling back

## 2016-05-29 NOTE — Telephone Encounter (Signed)
lmomtcb x1 

## 2016-05-29 NOTE — Telephone Encounter (Signed)
Spoke with pt and notified of results per Dr. Christene Slates. Pt verbalized understanding and denied any questions. Order sent to [facility] for CPAP and pt aware to call for 4-6 wk rov once she gets her machine

## 2016-06-01 ENCOUNTER — Ambulatory Visit: Payer: Self-pay | Admitting: Pulmonary Disease

## 2016-06-05 DIAGNOSIS — J0140 Acute pansinusitis, unspecified: Secondary | ICD-10-CM | POA: Diagnosis not present

## 2016-06-05 DIAGNOSIS — N3001 Acute cystitis with hematuria: Secondary | ICD-10-CM | POA: Diagnosis not present

## 2016-06-05 DIAGNOSIS — J029 Acute pharyngitis, unspecified: Secondary | ICD-10-CM | POA: Diagnosis not present

## 2016-06-06 DIAGNOSIS — F3340 Major depressive disorder, recurrent, in remission, unspecified: Secondary | ICD-10-CM | POA: Diagnosis not present

## 2016-06-06 DIAGNOSIS — F411 Generalized anxiety disorder: Secondary | ICD-10-CM | POA: Diagnosis not present

## 2016-06-06 DIAGNOSIS — F4310 Post-traumatic stress disorder, unspecified: Secondary | ICD-10-CM | POA: Diagnosis not present

## 2016-06-13 ENCOUNTER — Ambulatory Visit (INDEPENDENT_AMBULATORY_CARE_PROVIDER_SITE_OTHER): Payer: BLUE CROSS/BLUE SHIELD | Admitting: Neurology

## 2016-06-13 ENCOUNTER — Encounter: Payer: Self-pay | Admitting: Neurology

## 2016-06-13 VITALS — BP 112/62 | HR 85 | Ht 62.0 in | Wt 152.8 lb

## 2016-06-13 DIAGNOSIS — G43009 Migraine without aura, not intractable, without status migrainosus: Secondary | ICD-10-CM

## 2016-06-13 MED ORDER — TOPIRAMATE 50 MG PO TABS: 150.0000 mg | ORAL_TABLET | Freq: Every day | ORAL | 2 refills | Status: DC

## 2016-06-13 MED ORDER — ZOLMITRIPTAN 2.5 MG PO TABS: ORAL_TABLET | ORAL | 2 refills | Status: DC

## 2016-06-13 NOTE — Progress Notes (Signed)
NEUROLOGY FOLLOW UP OFFICE NOTE  [name] [id number]  HISTORY OF PRESENT ILLNESS: [name] is a 51 year old right-handed female with plantar fasciitis who follows up for migraine.  UPDATE: Intensity:  10/10 Duration:  Brief with Zomig but doesn't take it during the day because it causes eye pain. Frequency:  2 to 3 days per month Frequency of abortive medication: only as needed Current NSAIDS:  naproxen  (with Zomig) Current analgesics:  no Current triptans:  Zomig  (Relpax was too expensive).  Takes it at bedtime because it causes eye pressure. Current anti-emetic:  no Current muscle relaxants:  no Current anti-anxiolytic:  no Current sleep aide:  no Current Antihypertensive medications:  no Current Antidepressant medications:  no Current Anticonvulsant medications:  She increased her topiramate herself up to  daily. Current Vitamins/Herbal/Supplements:  no Current Antihistamines/Decongestants:  Atrovent Other therapy:  no   Caffeine:  32 oz tea daily Alcohol:  seldom Smoker:  no Diet:  Does not drink enough water.  No soda. Exercise:  no Depression/stress:  She has had an increase in stress and depression.  Her father is ill in the hospital and has a poor prognosis.  Also, she is having difficulty with her 3 year old daughter. Sleep hygiene:  She was referred for evaluation of OSA.  Sleep study revealed mild OSA but no significant central sleep apnea.  She was prescribed CPAP and will be set up for it tomorrow.   HISTORY: Onset:  "many years" Location:  Across forehead and left sided Quality:  Pressure/pounding Initial Intensity:  10/10 Initial duration:  constant Initial frequency:  constant Aura:  no Prodrome:  no Associated symptoms:  Phonophobia, sometimes nausea.  No photophobia or vomiting. Usually occurs in clusters separated by headache-free periods of several months.  Current flare-up started over 2 months ago and has been  constant and daily.  Last headache was 6 months ago. Triggers/exacerbating factors:  Loud noise, stress Relieving factors:  no Activity:  Able to function   Past NSAIDS:  ibuprofen, meloxicam, naproxen Past analgesics:  Goodys, Norco, Excedrin, Tylenol, Fioricet Past abortive triptans:  Sumatriptan tablet, Maxalt Past muscle relaxants:  Flexeril Past anti-emetic:  Zofran Past antihypertensive medications:  no Past antidepressant medications:  Prozac, venlafaxine XR 37.5mg , maybe nortriptyline many years ago for depression, Cymbalta (side effects)  Past anticonvulsant medications:  Gabapentin (for plantar fasciitis), topiramate  (maybe ineffective) Past vitamins/Herbal/Supplements:  no Other past therapies:  no   Family history of headache:  daughter   CT of head from 04/02/13 to evaluate headache was personally reviewed and revealed cerebellar tonsillar ectopia but no significant crowding at the foramen magnum.  PAST MEDICAL HISTORY: Past Medical History:  Diagnosis Date  . Anxiety   . Chronic headaches   . Depression   . Gall stones   . Plantar fasciitis, bilateral     MEDICATIONS: Current Outpatient Prescriptions on File Prior to Visit  Medication Sig Dispense Refill  . naproxen (NAPROSYN) 500 MG tablet Take 1 tablet (500 mg total) by mouth every 12 (twelve) hours as needed. 16 tablet 2  . rizatriptan (MAXALT-MLT) 10 MG disintegrating tablet Take 10 mg by mouth as needed for migraine. May repeat in 2 hours if needed    . traMADol (ULTRAM) 50 MG tablet Take 1 tablet (50 mg total) by mouth 3 (three) times daily. (Patient not taking: Reported on 06/13/2016) 90 tablet 2   No current facility-administered medications on file prior to visit.  ALLERGIES: Allergies  Allergen Reactions  . Bactrim [Sulfamethoxazole-Trimethoprim] Rash and Other (See Comments)    Pt states she developed a rash on arms, throat soreness, headache and ear pain.     FAMILY HISTORY: Family  History  Problem Relation Age of Onset  . Diabetes Mother   . Heart disease Mother     AMI  . Heart failure Father   . Heart disease Father     CHF with defibrillator.  . Hypertension Sister   . Polycystic ovary syndrome Daughter     SOCIAL HISTORY: Social History   Social History  . Marital status: Married    Spouse name: Loraine Leriche  . Number of children: N/A  . Years of education: 12th   Occupational History  . 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 Smoking status: Never Smoker  . Smokeless tobacco: Never Used  . Alcohol use Yes     Comment: occasionally, 1/month  . Drug use: No  . Sexual activity: Yes    Birth control/ protection: Injection, None     Comment: no menstruation in "years."   Other Topics Concern  . Not on file   Social History Narrative   Marital status: Married      Children: 3 children      Lives: with daughter and husband      Employment: works at [employer] in [employer]      Tobacco; none      Alcohol:  Socially; weekends.      Drugs: none     Exercise: none    REVIEW OF SYSTEMS: Constitutional: No fevers, chills, or sweats, no generalized fatigue, change in appetite Eyes: No visual changes, double vision, eye pain Ear, nose and throat: No hearing loss, ear pain, nasal congestion, sore throat Cardiovascular: No chest pain, palpitations Respiratory:  No shortness of breath at rest or with exertion, wheezes GastrointestinaI: No nausea, vomiting, diarrhea, abdominal pain, fecal incontinence Genitourinary:  No dysuria, urinary retention or frequency Musculoskeletal:  No neck pain, back pain Integumentary: No rash, pruritus, skin lesions Neurological: as above Psychiatric: depression Endocrine: No palpitations, fatigue, diaphoresis, mood swings, change in appetite, change in weight, increased thirst Hematologic/Lymphatic:  No purpura, petechiae. Allergic/Immunologic: no itchy/runny eyes, nasal congestion,  recent allergic reactions, rashes  PHYSICAL EXAM: Vitals:   06/13/16 1408  BP: 112/62  Pulse: 85   General: No acute distress.  Patient appears well-groomed.  normal body habitus. Head:  Normocephalic/atraumatic Eyes:  Fundi examined but not visualized Neck: supple, no paraspinal tenderness, full range of motion Heart:  Regular rate and rhythm Lungs:  Clear to auscultation bilaterally Back: No paraspinal tenderness Neurological Exam: alert and oriented to person, place, and time. Attention span and concentration intact, recent and remote memory intact, fund of knowledge intact.  Speech fluent and not dysarthric, language intact.  CN II-XII intact. Bulk and tone normal, muscle strength 5/5 throughout.  Sensation to light touch, temperature and vibration intact.  Deep tendon reflexes 2+ throughout, toes downgoing.  Finger to nose and heel to shin testing intact.  Gait normal, Romberg negative.  IMPRESSION: Migraine  PLAN: 1.  Will increase topiramate to  at bedtime (for sake of dose convenience) 2.  Will try Zomig 2.5mg  for abortive therapy (hopefully it will be effective and not cause eye pressure).  Otherwise, she should try just naproxen alone during the day for acute attack.  Alternatively, we can try a different triptan. 3.  Follow up in 4 months.   Shon Millet, DO  CC:  Abbe Amsterdam, MD

## 2016-06-13 NOTE — Patient Instructions (Signed)
1.  We will increase topiramate.  I will prescribe you topiramate  tablets.  Take 3 tablets at bedtime 2.  Take zomig 2.5mg  at earliest onset of headache.  May repeat once after 2 hours if needed.  Let me know if it still treats the headache and not give your the eye pain.  Otherwise, take one naproxen if you get a headache during the day. 3.  Follow up in 4 months.

## 2016-06-14 ENCOUNTER — Ambulatory Visit: Payer: BLUE CROSS/BLUE SHIELD | Admitting: Podiatry

## 2016-06-14 DIAGNOSIS — G4733 Obstructive sleep apnea (adult) (pediatric): Secondary | ICD-10-CM | POA: Diagnosis not present

## 2016-06-16 ENCOUNTER — Encounter (HOSPITAL_COMMUNITY): Payer: Self-pay | Admitting: Emergency Medicine

## 2016-06-16 ENCOUNTER — Ambulatory Visit (HOSPITAL_COMMUNITY)
Admission: EM | Admit: 2016-06-16 | Discharge: 2016-06-16 | Disposition: A | Discharge status: Home / Self Care | Payer: BLUE CROSS/BLUE SHIELD | Attending: Internal Medicine | Admitting: Internal Medicine

## 2016-06-16 DIAGNOSIS — N390 Urinary tract infection, site not specified: Secondary | ICD-10-CM | POA: Diagnosis not present

## 2016-06-16 DIAGNOSIS — R300 Dysuria: Secondary | ICD-10-CM | POA: Diagnosis not present

## 2016-06-16 LAB — POCT URINALYSIS DIP (DEVICE)
GLUCOSE, UA: NEGATIVE mg/dL
KETONES UR: NEGATIVE mg/dL
Nitrite: NEGATIVE
Protein, ur: >=300 mg/dL — AB
Specific Gravity, Urine: >=1.03 (ref 1.005–1.030)
Urobilinogen, UA: 0.2 mg/dL (ref 0.0–1.0)
pH: 5.5 (ref 5.0–8.0)

## 2016-06-16 MED ORDER — CEPHALEXIN 500 MG PO CAPS: 500.0000 mg | ORAL_CAPSULE | Freq: Four times a day (QID) | ORAL | 0 refills | Status: DC

## 2016-06-16 NOTE — ED Triage Notes (Signed)
The patient presented to the [facility] with a complaint of dysuria, hematuria, and urinary frequency.

## 2016-06-16 NOTE — Discharge Instructions (Signed)
Take medications 4 times a day as directed. Drink plenty of water and fluids as much as you can. Follow-up with primary care doctor as needed.

## 2016-06-16 NOTE — ED Provider Notes (Signed)
CSN: [id number]     Arrival date & time 06/16/16  1647 History   None    Chief Complaint  Patient presents with  . Dysuria   (Consider location/radiation/quality/duration/timing/severity/associated sxs/prior Treatment) HPI  Past Medical History:  Diagnosis Date  . Anxiety   . Chronic headaches   . Depression   . Gall stones   . Plantar fasciitis, bilateral    Past Surgical History:  Procedure Laterality Date  . CHOLECYSTECTOMY  06/09/2011   Procedure: LAPAROSCOPIC CHOLECYSTECTOMY;  Surgeon: Liz Malady, MD;  Location: [facility] OR;  Service: General;  Laterality: N/A;  . DILATION AND CURETTAGE OF UTERUS    . ERCP  06/08/2011   Procedure: ENDOSCOPIC RETROGRADE CHOLANGIOPANCREATOGRAPHY (ERCP);  Surgeon: Theda Belfast, MD;  Location: [facility] ENDOSCOPY;  Service: Endoscopy;  Laterality: N/A;  . TONSILLECTOMY     Family History  Problem Relation Age of Onset  . Diabetes Mother   . Heart disease Mother     AMI  . Heart failure Father   . Heart disease Father     CHF with defibrillator.  . Hypertension Sister   . Polycystic ovary syndrome Daughter    Social History  Substance Use Topics  . Smoking status: Never Smoker  . Smokeless tobacco: Never Used  . Alcohol use Yes     Comment: occasionally, 1/month   OB History    No data available     Review of Systems  Allergies  Bactrim [sulfamethoxazole-trimethoprim]  Home Medications   Prior to Admission medications   Medication Sig Start Date End Date Taking? Authorizing Provider  traMADol (ULTRAM) 50 MG tablet Take 1 tablet (50 mg total) by mouth 3 (three) times daily. 11/04/15  Yes Kirstie Peri Regal, DPM  ZOLMitriptan (ZOMIG) 2.5 MG tablet Take 1 tablet earliest onset of migraine.  May repeat once in 2 hours if headache persists or recurs. 06/13/16  Yes Adam Mliss Fritz, DO  cephALEXin (KEFLEX) 500 MG capsule Take 1 capsule (500 mg total) by mouth 4 (four) times daily. 06/16/16   Hayden Rasmussen, NP   Meds Ordered and Administered this  Visit  Medications - No data to display  BP 122/74 (BP Location: Right Arm)   Pulse 90   Temp 97.6 F (36.4 C) (Oral)   Resp 16   SpO2 100%  No data found.   Physical Exam  Urgent Care Course     Procedures (including critical care time)  Labs Review Labs Reviewed  POCT URINALYSIS DIP (DEVICE) - Abnormal; Notable for the following:       Result Value   Bilirubin Urine SMALL (*)    Hgb urine dipstick LARGE (*)    Protein, ur >=300 (*)    Leukocytes, UA TRACE (*)    All other components within normal limits  URINE CULTURE    Imaging Review No results found.   Visual Acuity Review  Right Eye Distance:   Left Eye Distance:   Bilateral Distance:    Right Eye Near:   Left Eye Near:    Bilateral Near:         MDM   1. Lower urinary tract infectious disease    Take medications 4 times a day as directed. Drink plenty of water and fluids as much as you can. Follow-up with primary care doctor as needed. Meds ordered this encounter  Medications  . cephALEXin (KEFLEX) 500 MG capsule    Sig: Take 1 capsule (500 mg total) by mouth 4 (four) times daily.  Dispense:  28 capsule    Refill:  0    Order Specific Question:   Supervising Provider    Answer:   Eustace Moore [[id number]]   Culture pending    Hayden Rasmussen, NP 06/16/16 1732

## 2016-06-18 LAB — URINE CULTURE: Culture: 60000 — AB

## 2016-06-22 ENCOUNTER — Ambulatory Visit: Payer: BLUE CROSS/BLUE SHIELD | Admitting: Podiatry

## 2016-06-27 DIAGNOSIS — F411 Generalized anxiety disorder: Secondary | ICD-10-CM | POA: Diagnosis not present

## 2016-06-27 DIAGNOSIS — F3340 Major depressive disorder, recurrent, in remission, unspecified: Secondary | ICD-10-CM | POA: Diagnosis not present

## 2016-06-27 DIAGNOSIS — F4310 Post-traumatic stress disorder, unspecified: Secondary | ICD-10-CM | POA: Diagnosis not present

## 2016-07-12 ENCOUNTER — Encounter: Payer: Self-pay | Admitting: Pulmonary Disease

## 2016-07-13 ENCOUNTER — Ambulatory Visit (INDEPENDENT_AMBULATORY_CARE_PROVIDER_SITE_OTHER): Payer: BLUE CROSS/BLUE SHIELD | Admitting: Pulmonary Disease

## 2016-07-13 ENCOUNTER — Encounter: Payer: Self-pay | Admitting: Pulmonary Disease

## 2016-07-13 VITALS — BP 112/80 | HR 74 | Ht 62.0 in | Wt 149.0 lb

## 2016-07-13 DIAGNOSIS — G4733 Obstructive sleep apnea (adult) (pediatric): Secondary | ICD-10-CM

## 2016-07-13 DIAGNOSIS — G4710 Hypersomnia, unspecified: Secondary | ICD-10-CM

## 2016-07-13 NOTE — Patient Instructions (Signed)
  It was a pleasure taking care of you today!  Continue using your CPAP machine.  We will schedule you for a cpap titration study to determine best pressure.   Please make sure you use your CPAP device everytime you sleep.  We will monitor the usage of your machine per your insurance requirement.  Your insurance company may take the machine from you if you are not using it regularly.   Please clean the mask, tubings, filter, water reservoir with soapy water every week.  Please use distilled water for the water reservoir.   Please call the office or your machine provider (DME company) if you are having issues with the device.   Return to clinic in 6-8 weeks with NP or APP.

## 2016-07-13 NOTE — Progress Notes (Signed)
Subjective:    Patient ID: [name], female    DOB: 12-14-1965, 51 y.o.   MRN: [id number]  HPI   This is the case of [name], 51 y.o. Female, who was referred by Dr. Shon Millet  in consultation regarding possible OSA.   As you very well know, patient is a non smoker, not known to have asthma or copd.   Patient has chronic migraine for which she sees a neurologist.  She has chronic hypersomnia, chronic fatigue, this affects her functionality. She has snoring, gasping, choking, unrefreshed sleep, frequent awakenings. She naps almost daily. Despite napping at this afternoon, she is able to sleep at night.  She works at [employer], 6am -2 pm. There is symptomatic at work.  ESS 23. Has a matter fact, she was on the phone last night and fell asleep talking to someone.   She does sleep talking. No other abnormal behavior and sleep.  ROV 07/13/2016 Patient returns to the office as follow-up on her sleep apnea. Since last seen, she had a lab sleep study which showed an AHI of 12. She is started on an auto CPAP 5-10 centimeters water. Feels somebetter using it. Still has significant sleepiness affecting fxnality.  Download the last 30 days: 73%, AHI 7. Denies leak issues. She is very symptomatic with hypersomnia, gets sleepy at work. She has chronic depression which is worse with the death of her father recently. She sees a Veterinary surgeon every 3-4 weeks.  Review of Systems  Constitutional: Negative for fever and unexpected weight change.  HENT: Negative for congestion, dental problem, ear pain, nosebleeds, postnasal drip, rhinorrhea, sinus pressure, sneezing, sore throat and trouble swallowing.        Chronic migraine  Eyes: Negative for redness and itching.  Respiratory: Negative for cough, chest tightness, shortness of breath and wheezing.   Cardiovascular: Negative for palpitations and leg swelling.  Gastrointestinal: Negative for nausea and vomiting.  Genitourinary: Negative  for dysuria.  Musculoskeletal: Negative for joint swelling.  Skin: Negative for rash.  Neurological: Negative for headaches.  Hematological: Does not bruise/bleed easily.  Psychiatric/Behavioral: Negative for dysphoric mood. The patient is not nervous/anxious.        Objective:   Physical Exam  Vitals:  Vitals:   07/13/16 1557  BP: 112/80  Pulse: 74  SpO2: 98%  Weight: 149 lb (67.6 kg)  Height: 5\' 2"  (1.575 m)    Constitutional/General:  Pleasant, well-nourished, well-developed, not in any distress,  Comfortably seating.  Well kempt  Body mass index is 27.25 kg/m. Wt Readings from Last 3 Encounters:  07/13/16 149 lb (67.6 kg)  06/13/16 152 lb 12.8 oz (69.3 kg)  05/23/16 157 lb (71.2 kg)     HEENT: Pupils equal and reactive to light and accommodation. Anicteric sclerae. Normal nasal mucosa.   No oral  lesions,  mouth clear,  oropharynx clear, no postnasal drip. (-) Oral thrush. No dental caries. Mild retrognathia.  Airway - Mallampati class IV  Neck: No masses. Midline trachea. No JVD, (-) LAD. (-) bruits appreciated.  Respiratory/Chest: Grossly normal chest. (-) deformity. (-) Accessory muscle use.  Symmetric expansion. (-) Tenderness on palpation.  Resonant on percussion.  Diminished BS on both lower lung zones. (-) wheezing, crackles, rhonchi (-) egophony  Cardiovascular: Regular rate and  rhythm, heart sounds normal, no murmur or gallops, no peripheral edema  Gastrointestinal:  Normal bowel sounds. Soft, non-tender. No hepatosplenomegaly.  (-) masses.   Musculoskeletal:  Normal muscle tone. Normal gait.  Extremities: Grossly normal. (-) clubbing, cyanosis.  (-) edema  Skin: (-) rash,lesions seen.   Neurological/Psychiatric : alert, oriented to time, place, person. Normal mood and affect           Assessment & Plan:  OSA (obstructive sleep apnea) She has chronic hypersomnia, chronic fatigue, this affects her functionality. She has snoring,  gasping, choking, unrefreshed sleep, frequent awakenings. She naps almost daily. Despite napping at this afternoon, she is able to sleep at night.  She works at [employer], 6am -2 pm. There is symptomatic at work.  She does sleep talking. No other abnormal behavior and sleep.  In lab study done 04/2016 : AHI only 12.  Started on autocpap 5-10. Good compliance, AHI 7. She remains very symptomatic still with hypersomnia affecting her functionality at work.  Denies leak issues. She has chronic depression worse recently with the passing of her father.  I am NOT sure if the hypersomnia is all from OSA or if it is worsened by depression.  Her compliance is good and she denies leak issues but her AHI is still high.    Plan ;  We extensively discussed the importance of treating OSA and the need to use PAP therapy.   Patient has significant hypersomnia affecting her functionality at work. She only has mild sleep apnea and her symptoms are very significant for her. Her AHI on her CPAP compliance 7.  Plan for a cpap titration study.  Need to R/O central apneas.  If she remains very symptomatic despite good compliance, may need to be more proactive as far as treating her depression.   Continue with autocpap 5-10 cm water pending cpap titration study.    Patient was instructed to have mask, tubings, filter, reservoir cleaned at least once a week with soapy water.  Patient was instructed to call the office if he/she is having issues with the PAP device.    I advised patient to obtain sufficient amount of sleep --  7 to 8 hours at least in a 24 hr period.  Patient was advised to follow good sleep hygiene.  Patient was advised NOT to engage in activities requiring concentration and/or vigilance if he/she is and  sleepy.  Patient is NOT to drive if he/she is sleepy.   Hypersomnia She remains very symptomatic.  He sleep study only showed mild OSA and she is not really corrected on autocpap.   Plan : 1. cpap  titration study 2.  If OSA is corrected and she remains symptomatic, she will need psychiatry or psychologist evaln.  She has chronic depression worse recently with the death of her father.  She is NOT on meds and sees a councilor q 3-4 weeks. Will also need a UDS at that time. Denies opiate intake.     Follow up in 6-8 weeks. Need to address hypersomnia.     Pollie MeyerJ. Angelo A. de Dios, MD 07/13/2016   11:18 PM Pulmonary and Critical Care Medicine [facility] Pager: [phone] Office: [phone], Fax: [phone]

## 2016-07-13 NOTE — Assessment & Plan Note (Addendum)
She has chronic hypersomnia, chronic fatigue, this affects her functionality. She has snoring, gasping, choking, unrefreshed sleep, frequent awakenings. She naps almost daily. Despite napping at this afternoon, she is able to sleep at night.  She works at [employer], 6am -2 pm. There is symptomatic at work.  She does sleep talking. No other abnormal behavior and sleep.  In lab study done 04/2016 : AHI only 12.  Started on autocpap 5-10. Good compliance, AHI 7. She remains very symptomatic still with hypersomnia affecting her functionality at work.  Denies leak issues. She has chronic depression worse recently with the passing of her father.  I am NOT sure if the hypersomnia is all from OSA or if it is worsened by depression.  Her compliance is good and she denies leak issues but her AHI is still high.    Plan ;  We extensively discussed the importance of treating OSA and the need to use PAP therapy.   Patient has significant hypersomnia affecting her functionality at work. She only has mild sleep apnea and her symptoms are very significant for her. Her AHI on her CPAP compliance 7.  Plan for a cpap titration study.  Need to R/O central apneas.  If she remains very symptomatic despite good compliance, may need to be more proactive as far as treating her depression.   Continue with autocpap 5-10 cm water pending cpap titration study.    Patient was instructed to have mask, tubings, filter, reservoir cleaned at least once a week with soapy water.  Patient was instructed to call the office if he/she is having issues with the PAP device.    I advised patient to obtain sufficient amount of sleep --  7 to 8 hours at least in a 24 hr period.  Patient was advised to follow good sleep hygiene.  Patient was advised NOT to engage in activities requiring concentration and/or vigilance if he/she is and  sleepy.  Patient is NOT to drive if he/she is sleepy.

## 2016-07-13 NOTE — Assessment & Plan Note (Addendum)
She remains very symptomatic.  He sleep study only showed mild OSA and she is not really corrected on autocpap.   Plan : 1. cpap titration study 2.  If OSA is corrected and she remains symptomatic, she will need psychiatry or psychologist evaln.  She has chronic depression worse recently with the death of her father.  She is NOT on meds and sees a councilor q 3-4 weeks. Will also need a UDS at that time. Denies opiate intake.

## 2016-07-14 DIAGNOSIS — G4733 Obstructive sleep apnea (adult) (pediatric): Secondary | ICD-10-CM | POA: Diagnosis not present

## 2016-07-18 DIAGNOSIS — F3340 Major depressive disorder, recurrent, in remission, unspecified: Secondary | ICD-10-CM | POA: Diagnosis not present

## 2016-07-18 DIAGNOSIS — F411 Generalized anxiety disorder: Secondary | ICD-10-CM | POA: Diagnosis not present

## 2016-07-18 DIAGNOSIS — F4310 Post-traumatic stress disorder, unspecified: Secondary | ICD-10-CM | POA: Diagnosis not present

## 2016-07-20 ENCOUNTER — Ambulatory Visit: Payer: BLUE CROSS/BLUE SHIELD | Attending: Pulmonary Disease | Admitting: Pulmonary Disease

## 2016-07-20 DIAGNOSIS — Z79899 Other long term (current) drug therapy: Secondary | ICD-10-CM | POA: Insufficient documentation

## 2016-07-20 DIAGNOSIS — F411 Generalized anxiety disorder: Secondary | ICD-10-CM | POA: Diagnosis not present

## 2016-07-20 DIAGNOSIS — G4733 Obstructive sleep apnea (adult) (pediatric): Secondary | ICD-10-CM

## 2016-07-20 DIAGNOSIS — F4310 Post-traumatic stress disorder, unspecified: Secondary | ICD-10-CM | POA: Diagnosis not present

## 2016-07-20 DIAGNOSIS — F3340 Major depressive disorder, recurrent, in remission, unspecified: Secondary | ICD-10-CM | POA: Diagnosis not present

## 2016-07-20 DIAGNOSIS — G4730 Sleep apnea, unspecified: Secondary | ICD-10-CM | POA: Diagnosis present

## 2016-07-26 DIAGNOSIS — G4733 Obstructive sleep apnea (adult) (pediatric): Secondary | ICD-10-CM | POA: Diagnosis not present

## 2016-08-01 DIAGNOSIS — F411 Generalized anxiety disorder: Secondary | ICD-10-CM | POA: Diagnosis not present

## 2016-08-01 DIAGNOSIS — F3340 Major depressive disorder, recurrent, in remission, unspecified: Secondary | ICD-10-CM | POA: Diagnosis not present

## 2016-08-01 DIAGNOSIS — F4310 Post-traumatic stress disorder, unspecified: Secondary | ICD-10-CM | POA: Diagnosis not present

## 2016-08-14 DIAGNOSIS — G4733 Obstructive sleep apnea (adult) (pediatric): Secondary | ICD-10-CM | POA: Diagnosis not present

## 2016-08-15 DIAGNOSIS — G4733 Obstructive sleep apnea (adult) (pediatric): Secondary | ICD-10-CM | POA: Diagnosis not present

## 2016-08-15 DIAGNOSIS — F4310 Post-traumatic stress disorder, unspecified: Secondary | ICD-10-CM | POA: Diagnosis not present

## 2016-08-15 DIAGNOSIS — F411 Generalized anxiety disorder: Secondary | ICD-10-CM | POA: Diagnosis not present

## 2016-08-15 DIAGNOSIS — F3340 Major depressive disorder, recurrent, in remission, unspecified: Secondary | ICD-10-CM | POA: Diagnosis not present

## 2016-08-15 NOTE — Procedures (Signed)
Patient Name: [name], [place] Study Date: 07/20/2016 Gender: Female D.O.B: Mar 20, 1965 Age (years): 51 Referring Provider: Delphina CahillJose Angelo De Dios Height (inches): 62 Interpreting Physician: Cyril Mourningakesh Mariela Rex MD, ABSM Weight (lbs): 157 RPSGT: Peak, Robert BMI: 29 MRN: [id number] Neck Size: 14.00 CLINICAL INFORMATION The patient is referred for a CPAP titration to treat sleep apnea.  Date of NPSG: 04/2016, mild OSA with AHI 12/h  SLEEP STUDY TECHNIQUE As per the AASM Manual for the Scoring of Sleep and Associated Events v2.3 (April 2016) with a hypopnea requiring 4% desaturations.  The channels recorded and monitored were frontal, central and occipital EEG, electrooculogram (EOG), submentalis EMG (chin), nasal and oral airflow, thoracic and abdominal wall motion, anterior tibialis EMG, snore microphone, electrocardiogram, and pulse oximetry. Continuous positive airway pressure (CPAP) was initiated at the beginning of the study and titrated to treat sleep-disordered breathing.  MEDICATIONS Medications self-administered by patient taken the night of the study : TOPIRAMATE  RESPIRATORY PARAMETERS Optimal PAP Pressure (cm): 5 AHI at Optimal Pressure (/hr): N/A Overall Minimal O2 (%): 89.00 Supine % at Optimal Pressure (%): N/A Minimal O2 at Optimal Pressure (%): 89.00     SLEEP ARCHITECTURE The study was initiated at 10:32:05 PM and ended at 4:52:02 AM.  Sleep onset time was 2.8 minutes and the sleep efficiency was 95.3%. The total sleep time was 362.1 minutes.  The patient spent 2.21% of the night in stage N1 sleep, 81.19% in stage N2 sleep, 2.35% in stage N3 and 14.25% in REM.Stage REM latency was 86.5 minutes  Wake after sleep onset was 15.0. Alpha intrusion was absent. Supine sleep was 47.05%.  CARDIAC DATA The 2 lead EKG demonstrated sinus rhythm. The mean heart rate was 63.31 beats per minute. Other EKG findings include: None.   LEG MOVEMENT DATA The total Periodic Limb  Movements of Sleep (PLMS) were 0. The PLMS index was 0.00. A PLMS index of <15 is considered normal in adults.  IMPRESSIONS - The optimal PAP pressure was 5 cm of water. - Central sleep apnea was not noted during this titration (CAI = 0.0/h). - Mild oxygen desaturations were observed during this titration (min O2 = 89.00%). - No snoring was audible during this study. - No cardiac abnormalities were observed during this study. - Clinically significant periodic limb movements were not noted during this study. Arousals associated with PLMs were rare.   DIAGNOSIS - Obstructive Sleep Apnea (327.23 [G47.33 ICD-10])   RECOMMENDATIONS - Trial of CPAP therapy on 5 cm H2O with a X-Small size Resmed Nasal Pillow Mask AirFit P10 for Her mask and heated humidification. - Avoid alcohol, sedatives and other CNS depressants that may worsen sleep apnea and disrupt normal sleep architecture. - Sleep hygiene should be reviewed to assess factors that may improve sleep quality. - Weight management and regular exercise should be initiated or continued. - Return to Sleep Center for re-evaluation after 4 weeks of therapy  [Electronically signed] 08/15/2016 01:20 PM  Cyril Mourningakesh Juwan Vences MD, ABSM Diplomate, American Board of Sleep Medicine   NPI: [id number]

## 2016-08-18 ENCOUNTER — Encounter: Payer: Self-pay | Admitting: Acute Care

## 2016-08-18 DIAGNOSIS — F3340 Major depressive disorder, recurrent, in remission, unspecified: Secondary | ICD-10-CM | POA: Diagnosis not present

## 2016-08-18 DIAGNOSIS — F4310 Post-traumatic stress disorder, unspecified: Secondary | ICD-10-CM | POA: Diagnosis not present

## 2016-08-18 DIAGNOSIS — F411 Generalized anxiety disorder: Secondary | ICD-10-CM | POA: Diagnosis not present

## 2016-08-24 ENCOUNTER — Ambulatory Visit: Payer: Self-pay | Admitting: Acute Care

## 2016-08-29 DIAGNOSIS — F411 Generalized anxiety disorder: Secondary | ICD-10-CM | POA: Diagnosis not present

## 2016-08-29 DIAGNOSIS — F3340 Major depressive disorder, recurrent, in remission, unspecified: Secondary | ICD-10-CM | POA: Diagnosis not present

## 2016-08-29 DIAGNOSIS — F4310 Post-traumatic stress disorder, unspecified: Secondary | ICD-10-CM | POA: Diagnosis not present

## 2016-09-01 ENCOUNTER — Telehealth: Payer: Self-pay | Admitting: Internal Medicine

## 2016-09-01 ENCOUNTER — Encounter: Payer: Self-pay | Admitting: Internal Medicine

## 2016-09-01 NOTE — Progress Notes (Signed)
ATC, received a [employer] changed, disconnected, or no longer in service message. As this the third attempt, a message will be sent to address on file.

## 2016-09-01 NOTE — Progress Notes (Signed)
A letter will be sent to address on file.

## 2016-09-01 NOTE — Telephone Encounter (Signed)
Disregard this entrance.

## 2016-09-13 DIAGNOSIS — G4733 Obstructive sleep apnea (adult) (pediatric): Secondary | ICD-10-CM | POA: Diagnosis not present

## 2016-09-18 ENCOUNTER — Telehealth: Payer: Self-pay | Admitting: Neurology

## 2016-09-18 NOTE — Telephone Encounter (Signed)
Caller: Joycelin  Urgent? No  Reason for the call: Her work is requesting that she get a note from Dr. Everlena CooperJaffe stating that she has Migraines and that she has to take prescription medication for them as well as them being ongoing. Where she works they are on a point system and when she has to leave early because of her migraine, they would like to have this note. Please Advise. Thanks

## 2016-09-18 NOTE — Telephone Encounter (Signed)
Letter written. At front for pick up. Patient will get tomorrow.

## 2016-09-18 NOTE — Telephone Encounter (Signed)
Yes, we can write something.

## 2016-09-18 NOTE — Telephone Encounter (Signed)
Please advise if this is something you would write.

## 2016-09-19 DIAGNOSIS — F3340 Major depressive disorder, recurrent, in remission, unspecified: Secondary | ICD-10-CM | POA: Diagnosis not present

## 2016-09-19 DIAGNOSIS — F4310 Post-traumatic stress disorder, unspecified: Secondary | ICD-10-CM | POA: Diagnosis not present

## 2016-09-19 DIAGNOSIS — F411 Generalized anxiety disorder: Secondary | ICD-10-CM | POA: Diagnosis not present

## 2016-09-22 ENCOUNTER — Encounter: Payer: Self-pay | Admitting: Neurology

## 2016-09-22 ENCOUNTER — Ambulatory Visit (INDEPENDENT_AMBULATORY_CARE_PROVIDER_SITE_OTHER): Payer: BLUE CROSS/BLUE SHIELD | Admitting: Neurology

## 2016-09-22 VITALS — BP 94/68 | HR 86 | Ht 62.0 in | Wt 146.0 lb

## 2016-09-22 DIAGNOSIS — G43019 Migraine without aura, intractable, without status migrainosus: Secondary | ICD-10-CM | POA: Diagnosis not present

## 2016-09-22 MED ORDER — TOPIRAMATE 100 MG PO TABS: 200.0000 mg | ORAL_TABLET | Freq: Every day | ORAL | 2 refills | Status: DC

## 2016-09-22 MED ORDER — SUMATRIPTAN 20 MG/ACT NA SOLN: NASAL | 2 refills | Status: DC

## 2016-09-22 NOTE — Progress Notes (Signed)
NEUROLOGY FOLLOW UP OFFICE NOTE  [name] [id number]  HISTORY OF PRESENT ILLNESS: [name] is a 51 year old right-handed female with OSA and plantar fasciitis who follows up for migraine.   UPDATE: Migraines have increased since last visit.  In June, she had a migraine that lasted 3 days.  Two weeks ago, she had a severe migraine that lasted 7 days.  She was out of work while caring for her ill father.  Her father passed away and she had just returned to work during the week of that migraine.  She has mild headaches that last all day and occur 3 days a week.  Current NSAIDS:  no Current analgesics:  no Current triptans:  Zomig 2.5mg  ineffective (5mg  effective but caused eye pressure) Current anti-emetic:  no Current muscle relaxants:  no Current anti-anxiolytic:  no Current sleep aide:  no Current Antihypertensive medications:  no Current Antidepressant medications:  no Current Anticonvulsant medications:  topiramate 150mg  Current Vitamins/Herbal/Supplements:  no Current Antihistamines/Decongestants:  Atrovent Other therapy:  no   Caffeine:  32 oz tea daily Alcohol:  seldom Smoker:  no Diet:  Does not drink enough water.  No soda. Exercise:  no Depression/stress:  Her father passed away recently due to illness. Sleep hygiene:  She was referred for evaluation of OSA.  Sleep study revealed mild OSA but no significant central sleep apnea.  She was prescribed CPAP and will be set up for it tomorrow.   HISTORY: Onset:  "many years" Location:  Across forehead and left sided Quality:  Pressure/pounding Initial Intensity:  10/10; April: 10/10 Initial duration:  constant; April: brief with Zomig 5mg  Initial frequency:  constant; April: 2 to 3 days per month Aura:  no Prodrome:  no Associated symptoms:  Phonophobia, sometimes nausea.  No photophobia or vomiting. Usually occurs in clusters separated by headache-free periods of several months.  Current flare-up  started over 2 months ago and has been constant and daily.  Last headache was 6 months ago. Triggers/exacerbating factors:  Loud noise, stress Relieving factors:  no Activity:  Able to function   Past NSAIDS:  ibuprofen, meloxicam, naproxen Past analgesics:  Goodys, Norco, Excedrin, Tylenol, Fioricet Past abortive triptans:  Sumatriptan tablet, Maxalt.  Relpax was too expensive. Past muscle relaxants:  Flexeril Past anti-emetic:  Zofran Past antihypertensive medications:  no Past antidepressant medications:  Prozac, venlafaxine XR 37.5mg , maybe nortriptyline many years ago for depression, Cymbalta (side effects)  Past anticonvulsant medications:  Gabapentin (for plantar fasciitis), topiramate 50mg  (maybe ineffective) Past vitamins/Herbal/Supplements:  no Other past therapies:  no   Family history of headache:  daughter   CT of head from 04/02/13 to evaluate headache was personally reviewed and revealed cerebellar tonsillar ectopia but no significant crowding at the foramen magnum.  PAST MEDICAL HISTORY: Past Medical History:  Diagnosis Date  . Anxiety   . Chronic headaches   . Depression   . Gall stones   . Plantar fasciitis, bilateral     MEDICATIONS: Current Outpatient Prescriptions on File Prior to Visit  Medication Sig Dispense Refill  . ZOLMitriptan (ZOMIG) 2.5 MG tablet Take 1 tablet earliest onset of migraine.  May repeat once in 2 hours if headache persists or recurs. 10 tablet 2  . traMADol (ULTRAM) 50 MG tablet Take 1 tablet (50 mg total) by mouth 3 (three) times daily. (Patient not taking: Reported on 09/22/2016) 90 tablet 2   No current facility-administered medications on file prior to visit.  ALLERGIES: Allergies  Allergen Reactions  . Bactrim [Sulfamethoxazole-Trimethoprim] Rash and Other (See Comments)    Pt states she developed a rash on arms, throat soreness, headache and ear pain.     FAMILY HISTORY: Family History  Problem Relation Age of Onset  .  Diabetes Mother   . Heart disease Mother        AMI  . Heart failure Father   . Heart disease Father        CHF with defibrillator.  . Hypertension Sister   . Polycystic ovary syndrome Daughter     SOCIAL HISTORY: Social History   Social History  . Marital status: Married    Spouse name: [name]  . Number of children: N/A  . Years of education: 12th   Occupational History  . 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 Smoking status: Never Smoker  . Smokeless tobacco: Never Used  . Alcohol use Yes     Comment: occasionally, 1/month  . Drug use: No  . Sexual activity: Yes    Birth control/ protection: Injection, None     Comment: no menstruation in "years."   Other Topics Concern  . Not on file   Social History Narrative   Marital status: Married      Children: 3 children      Lives: with daughter and husband      Em[place] [employer] [place]      Tobacco; none      Alcohol:  Socially; weekends.      Drugs: none     Exercise: none    REVIEW OF SYSTEMS: Constitutional: No fevers, chills, or sweats, no generalized fatigue, change in appetite Eyes: No visual changes, double vision, eye pain Ear, nose and throat: No hearing loss, ear pain, nasal congestion, sore throat Cardiovascular: No chest pain, palpitations Respiratory:  No shortness of breath at rest or with exertion, wheezes GastrointestinaI: No nausea, vomiting, diarrhea, abdominal pain, fecal incontinence Genitourinary:  No dysuria, urinary retention or frequency Musculoskeletal:  No neck pain, back pain Integumentary: No rash, pruritus, skin lesions Neurological: as above Psychiatric: No depression, insomnia, anxiety Endocrine: No palpitations, fatigue, diaphoresis, mood swings, change in appetite, change in weight, increased thirst Hematologic/Lymphatic:  No purpura, petechiae. Allergic/Immunologic: no itchy/runny eyes, nasal congestion, recent allergic  reactions, rashes  PHYSICAL EXAM: Vitals:   09/22/16 1408  BP: 94/68  Pulse: 86   General: No acute distress.  Patient appears well-groomed.   Head:  Normocephalic/atraumatic Eyes:  Fundi examined but not visualized Neck: supple, no paraspinal tenderness, full range of motion Heart:  Regular rate and rhythm Lungs:  Clear to auscultation bilaterally Back: No paraspinal tenderness Neurological Exam: alert and oriented to person, place, and time. Attention span and concentration intact, recent and remote memory intact, fund of knowledge intact.  Speech fluent and not dysarthric, language intact.  CN II-XII intact. Bulk and tone normal, muscle strength 5/5 throughout.  Sensation to light touch, temperature and vibration intact.  Deep tendon reflexes 2+ throughout, toes downgoing.  Finger to nose and heel to shin testing intact.  Gait normal, Romberg negative.  IMPRESSION: Intractable migraine.  Likely exacerbated by depression (bereavement), stress from returning to work and change in weather.  PLAN: 1.  Increase topiramate to 200mg  at bedtime 2.  Stop Zomig and instead will try sumatriptan 20mg  NS 3.  Will fill out FMLA 4.  Follow up in 3 months  Jenaya Saar  Tomi Likens, DO  CC: Lamar Blinks, MD

## 2016-09-22 NOTE — Patient Instructions (Signed)
1.  Increase topiramate to 200mg  at bedtime 2.  Stop Zomig.  Instead, use the sumatriptan nasal spray, 1 spray in one nostril at earliest onset of migraine.  May repeat once after 2 hours if needed.  Do not exceed 2 sprays in 24 hours. 3.  Follow up in 3 months. 4.  Will contact you when forms are filled.

## 2016-09-26 DIAGNOSIS — Z029 Encounter for administrative examinations, unspecified: Secondary | ICD-10-CM

## 2016-09-27 ENCOUNTER — Telehealth: Payer: Self-pay | Admitting: Neurology

## 2016-09-27 NOTE — Telephone Encounter (Signed)
Thank you :)

## 2016-09-27 NOTE — Telephone Encounter (Signed)
This is a pt of Dr. Moises BloodJaffe's

## 2016-09-27 NOTE — Telephone Encounter (Signed)
Patient called needing to know about paperwork that she dropped off on Friday regarding her work. She is at work and will not be available until after 2:15 PM. Please call. Thanks

## 2016-09-27 NOTE — Telephone Encounter (Signed)
Brandy- I gave this to you yesterday to collect payment from patient. Forms complete.

## 2016-09-27 NOTE — Telephone Encounter (Signed)
Sorry I remember now. When she called I forgot. I called the patient and she will come by the office tomorrow. Thanks

## 2016-10-03 ENCOUNTER — Ambulatory Visit: Payer: Self-pay | Admitting: Neurology

## 2016-10-10 ENCOUNTER — Encounter: Payer: Self-pay | Admitting: Adult Health

## 2016-10-10 ENCOUNTER — Ambulatory Visit (INDEPENDENT_AMBULATORY_CARE_PROVIDER_SITE_OTHER): Payer: BLUE CROSS/BLUE SHIELD | Admitting: Adult Health

## 2016-10-10 VITALS — BP 122/70 | HR 74 | Ht 62.0 in | Wt 141.0 lb

## 2016-10-10 DIAGNOSIS — G4733 Obstructive sleep apnea (adult) (pediatric): Secondary | ICD-10-CM

## 2016-10-10 NOTE — Patient Instructions (Signed)
Continue on C Pap at bedtime. Order for new chin strap and headgear sent to your home care company Try to wear each night. Try to get in at least 4-6 hours each night. Do not drive if sleepy Follow-up with Dr. Vassie LollAlva in 4 months and as needed

## 2016-10-10 NOTE — Assessment & Plan Note (Signed)
Mild OSA  Add chin strap  Order for new headgear.   Plan  Patient Instructions  Continue on C Pap at bedtime. Order for new chin strap and headgear sent to your home care company Try to wear each night. Try to get in at least 4-6 hours each night. Do not drive if sleepy Follow-up with Dr. Vassie LollAlva in 4 months and as needed

## 2016-10-10 NOTE — Progress Notes (Signed)
 @Patient  ID: [name], female    DOB: 04/19/1965, 51 y.o.   MRN: [id number]  Chief Complaint  Patient presents with  . Follow-up    OSA    Referring provider: Pearline Cablesopland, Jessica C, MD  HPI: 51 year old female followed for mild obstructive sleep apnea  TEST  Sleep study 04/2016 AHI 12/3/hr , SaO2 low 82%.  CPAP titration study 07/20/16 CPAP optimal pressure 5cmH2o   10/10/2016 Follow up : OSA  Patient returns for a three-month follow-up. Patient has known mild obstructive sleep apnea. She was started on C Pap machine. Patient says that it is only working minimally. She's continues to wake up. She is currently using a nasal pillow but feels like that her mouth becomes open and snores. She also feels that her head strap is loose. C Pap download shows good usage with average usage around 5.5 hours. She is on AutoSet 5-10 cm H2O. AHI 1.3. Positive leaks.    Allergies  Allergen Reactions  . Bactrim [Sulfamethoxazole-Trimethoprim] Rash and Other (See Comments)    Pt states she developed a rash on arms, throat soreness, headache and ear pain.     Immunization History  Administered Date(s) Administered  . Influenza Split 11/28/2015  . Influenza,inj,Quad PF,36+ Mos 01/29/2014  . Influenza-Unspecified 04/03/2016  . Td 01/27/2001    Past Medical History:  Diagnosis Date  . Anxiety   . Chronic headaches   . Depression   . Gall stones   . Plantar fasciitis, bilateral     Tobacco History: History  Smoking Status  . Never Smoker  Smokeless Tobacco  . Never Used   Counseling given: Not Answered   Outpatient Encounter Prescriptions as of 10/10/2016  Medication Sig  . topiramate (TOPAMAX) 100 MG tablet Take 2 tablets (200 mg total) by mouth at bedtime.  . SUMAtriptan (IMITREX) 20 MG/ACT nasal spray Spray once in one nostril.  May repeat x1 in 2 hours if headache persists or recurs. (Patient not taking: Reported on 10/10/2016)  . traMADol (ULTRAM) 50 MG tablet Take 1  tablet (50 mg total) by mouth 3 (three) times daily. (Patient not taking: Reported on 09/22/2016)  . traMADol (ULTRAM) 50 MG tablet Take 200 mg by mouth at bedtime.  Marland Kitchen. ZOLMitriptan (ZOMIG) 2.5 MG tablet Take 1 tablet earliest onset of migraine.  May repeat once in 2 hours if headache persists or recurs. (Patient not taking: Reported on 10/10/2016)   No facility-administered encounter medications on file as of 10/10/2016.      Review of Systems  Constitutional:   No  weight loss, night sweats,  Fevers, chills, fatigue, or  lassitude.  HEENT:   No headaches,  Difficulty swallowing,  Tooth/dental problems, or  Sore throat,                No sneezing, itching, ear ache, nasal congestion, post nasal drip,   CV:  No chest pain,  Orthopnea, PND, swelling in lower extremities, anasarca, dizziness, palpitations, syncope.   GI  No heartburn, indigestion, abdominal pain, nausea, vomiting, diarrhea, change in bowel habits, loss of appetite, bloody stools.   Resp: No shortness of breath with exertion or at rest.  No excess mucus, no productive cough,  No non-productive cough,  No coughing up of blood.  No change in color of mucus.  No wheezing.  No chest wall deformity  Skin: no rash or lesions.  GU: no dysuria, change in color of urine, no urgency or frequency.  No flank pain, no hematuria  MS:  No joint pain or swelling.  No decreased range of motion.  No back pain.    Physical Exam  BP 122/70 (BP Location: Left Arm, Cuff Size: Normal)   Pulse 74   Ht 5\' 2"  (1.575 m)   Wt 141 lb (64 kg)   SpO2 99%   BMI 25.79 kg/m   GEN: A/Ox3; pleasant , NAD, well nourished    HEENT:  [place]/AT,  EACs-clear, TMs-wnl, NOSE-clear, THROAT-clear, no lesions, no postnasal drip or exudate noted. Class 2 MP airway   NECK:  Supple w/ fair ROM; no JVD; normal carotid impulses w/o bruits; no thyromegaly or nodules palpated; no lymphadenopathy.    RESP  Clear  P & A; w/o, wheezes/ rales/ or rhonchi. no accessory  muscle use, no dullness to percussion  CARD:  RRR, no m/r/g, no peripheral edema, pulses intact, no cyanosis or clubbing.  GI:   Soft & nt; nml bowel sounds; no organomegaly or masses detected.   Musco: Warm bil, no deformities or joint swelling noted.   Neuro: alert, no focal deficits noted.    Skin: Warm, no lesions or rashes     BMET  BNP No results found for: BNP  ProBNP No results found for: PROBNP  Imaging: No results found.   Assessment & Plan:   OSA (obstructive sleep apnea) Mild OSA  Add chin strap  Order for new headgear.   Plan  Patient Instructions  Continue on C Pap at bedtime. Order for new chin strap and headgear sent to your home care company Try to wear each night. Try to get in at least 4-6 hours each night. Do not drive if sleepy Follow-up with Dr. Vassie Loll in 4 months and as needed       Rubye Oaks, NP 10/10/2016

## 2016-10-11 ENCOUNTER — Telehealth: Payer: Self-pay | Admitting: Adult Health

## 2016-10-11 NOTE — Telephone Encounter (Signed)
Called and spoke to the patient told her we had gotten confirmation today around 1 that they had the order.

## 2016-10-14 DIAGNOSIS — G4733 Obstructive sleep apnea (adult) (pediatric): Secondary | ICD-10-CM | POA: Diagnosis not present

## 2016-10-16 ENCOUNTER — Encounter (HOSPITAL_BASED_OUTPATIENT_CLINIC_OR_DEPARTMENT_OTHER): Payer: Self-pay | Admitting: *Deleted

## 2016-10-16 ENCOUNTER — Emergency Department (HOSPITAL_BASED_OUTPATIENT_CLINIC_OR_DEPARTMENT_OTHER)
Admission: EM | Admit: 2016-10-16 | Discharge: 2016-10-16 | Disposition: A | Discharge status: Home / Self Care | Payer: BLUE CROSS/BLUE SHIELD | Attending: Emergency Medicine | Admitting: Emergency Medicine

## 2016-10-16 DIAGNOSIS — Z79899 Other long term (current) drug therapy: Secondary | ICD-10-CM | POA: Insufficient documentation

## 2016-10-16 DIAGNOSIS — R109 Unspecified abdominal pain: Secondary | ICD-10-CM | POA: Diagnosis not present

## 2016-10-16 DIAGNOSIS — R1013 Epigastric pain: Secondary | ICD-10-CM

## 2016-10-16 LAB — CBC WITH DIFFERENTIAL/PLATELET
Basophils Absolute: 0 10*3/uL (ref 0.0–0.1)
Basophils Relative: 0 %
EOS ABS: 0.1 10*3/uL (ref 0.0–0.7)
Eosinophils Relative: 2 %
HEMATOCRIT: 41.7 % (ref 36.0–46.0)
HEMOGLOBIN: 14.2 g/dL (ref 12.0–15.0)
LYMPHS ABS: 1.4 10*3/uL (ref 0.7–4.0)
Lymphocytes Relative: 17 %
MCH: 32.2 pg (ref 26.0–34.0)
MCHC: 34.1 g/dL (ref 30.0–36.0)
MCV: 94.6 fL (ref 78.0–100.0)
Monocytes Absolute: 0.4 10*3/uL (ref 0.1–1.0)
Monocytes Relative: 5 %
NEUTROS ABS: 6.5 10*3/uL (ref 1.7–7.7)
NEUTROS PCT: 76 %
Platelets: 157 10*3/uL (ref 150–400)
RBC: 4.41 MIL/uL (ref 3.87–5.11)
RDW: 12.7 % (ref 11.5–15.5)
WBC: 8.5 10*3/uL (ref 4.0–10.5)

## 2016-10-16 LAB — URINALYSIS, MICROSCOPIC (REFLEX)

## 2016-10-16 LAB — COMPREHENSIVE METABOLIC PANEL WITH GFR
ALT: 17 U/L (ref 14–54)
AST: 22 U/L (ref 15–41)
Albumin: 4 g/dL (ref 3.5–5.0)
Alkaline Phosphatase: 46 U/L (ref 38–126)
Anion gap: 9 (ref 5–15)
BUN: 11 mg/dL (ref 6–20)
CO2: 19 mmol/L — ABNORMAL LOW (ref 22–32)
Calcium: 9.6 mg/dL (ref 8.9–10.3)
Chloride: 113 mmol/L — ABNORMAL HIGH (ref 101–111)
Creatinine, Ser: 0.76 mg/dL (ref 0.44–1.00)
GFR calc Af Amer: >60 mL/min
GFR calc non Af Amer: >60 mL/min
Glucose, Bld: 133 mg/dL — ABNORMAL HIGH (ref 65–99)
Potassium: 3.6 mmol/L (ref 3.5–5.1)
Sodium: 141 mmol/L (ref 135–145)
Total Bilirubin: 0.4 mg/dL (ref 0.3–1.2)
Total Protein: 6.5 g/dL (ref 6.5–8.1)

## 2016-10-16 LAB — URINALYSIS, ROUTINE W REFLEX MICROSCOPIC
Glucose, UA: NEGATIVE mg/dL
Ketones, ur: NEGATIVE mg/dL
Nitrite: NEGATIVE
Protein, ur: NEGATIVE mg/dL
Specific Gravity, Urine: 1.019 (ref 1.005–1.030)
pH: 5 (ref 5.0–8.0)

## 2016-10-16 LAB — LIPASE, BLOOD: Lipase: 34 U/L (ref 11–51)

## 2016-10-16 MED ORDER — OMEPRAZOLE 20 MG PO CPDR: 20.0000 mg | DELAYED_RELEASE_CAPSULE | Freq: Every day | ORAL | 0 refills | Status: DC

## 2016-10-16 MED ORDER — ONDANSETRON HCL 4 MG/2ML IJ SOLN
4.0000 mg | Freq: Once | INTRAMUSCULAR | Status: AC
  Administered 2016-10-16: 4 mg via INTRAVENOUS
  Filled 2016-10-16: qty 2

## 2016-10-16 MED ORDER — MORPHINE SULFATE (PF) 4 MG/ML IV SOLN
4.0000 mg | Freq: Once | INTRAVENOUS | Status: AC
  Administered 2016-10-16: 4 mg via INTRAVENOUS
  Filled 2016-10-16: qty 1

## 2016-10-16 MED ORDER — SODIUM CHLORIDE 0.9 % IV BOLUS (SEPSIS)
1000.0000 mL | Freq: Once | INTRAVENOUS | Status: AC
  Administered 2016-10-16: 1000 mL via INTRAVENOUS

## 2016-10-16 MED ORDER — ONDANSETRON 4 MG PO TBDP: 4.0000 mg | ORAL_TABLET | Freq: Three times a day (TID) | ORAL | 0 refills | Status: DC | PRN

## 2016-10-16 MED ORDER — GI COCKTAIL ~~LOC~~
30.0000 mL | Freq: Once | ORAL | Status: AC
  Administered 2016-10-16: 30 mL via ORAL
  Filled 2016-10-16: qty 30

## 2016-10-16 MED FILL — OMEPRAZOLE 20 MG CAP: 20 | 30 days supply | Qty: 30 | Fill #0

## 2016-10-16 MED FILL — ONDANSETRON ODT 4 MG TABLET: 4 | 4 days supply | Qty: 10 | Fill #0

## 2016-10-16 NOTE — ED Notes (Signed)
Pt provided with PO fluids at this time. 

## 2016-10-16 NOTE — ED Notes (Signed)
Pt c/o increased pain at this time. Will make PA aware.

## 2016-10-16 NOTE — ED Notes (Signed)
PA at bedside talking with patient.

## 2016-10-16 NOTE — ED Provider Notes (Signed)
[facility]   CSN: [id number] Arrival date & time: 10/16/16  0850     History   Chief Complaint Chief Complaint  Patient presents with  . Abdominal Pain    HPI Glayds Rickayla Wieland is a 51 y.o. female presenting from her place of work via EMS with sudden onset nonradiating sharp/achy epigastric/RUQ abdominal pain with associated nausea, heat wave and shakiness one hour prior to arrival. 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 Patient has not eaten this morning but reports that she never eats until her morning break. Denies dysuria, hematuria, melena or bloody stool, vomiting, diarrhea. Last bowel movement 3 days ago which is baseline for patient. Patient reports LMP more than 2 years ago, not currently on contraceptives. Postmenopausal. Status post cholecystectomy in 2013.   HPI  Past Medical History:  Diagnosis Date  . Anxiety   . Chronic headaches   . Depression   . Gall stones   . Plantar fasciitis, bilateral     Patient Active Problem List   Diagnosis Date Noted  . OSA (obstructive sleep apnea) 05/29/2016  . Hypersomnia 04/06/2016  . Lumbar degenerative disc disease 05/07/2015  . Other mixed anxiety disorders 08/28/2014  . S/P laparoscopic cholecystectomy 07/05/2011  . BREAST PAIN 05/19/2009  . CERVICAL STRAIN 05/19/2009  . ALLERGIC RHINITIS 06/22/2008  . Migraine 06/22/2008  . DEPRESSION, MAJOR, MODERATE 06/15/2008  . PARESTHESIA 06/15/2008  . PULMONARY NODULE 04/17/2007  . COUGH 04/17/2007  . Chest pain, atypical 04/17/2007  . MENORRHAGIA 07/02/2006  . NOCTURIA 04/26/2006    Past Surgical History:  Procedure Laterality Date  . CHOLECYSTECTOMY  06/09/2011   Procedure: LAPAROSCOPIC CHOLECYSTECTOMY;  Surgeon: Liz Malady, MD;  Location: [facility] OR;  Service: General;  Laterality: N/A;  . DILATION AND CURETTAGE OF UTERUS    . ERCP  06/08/2011   Procedure: ENDOSCOPIC RETROGRADE  CHOLANGIOPANCREATOGRAPHY (ERCP);  Surgeon: Theda Belfast, MD;  Location[facility] ENDOSCOPY;  Service: Endoscopy;  Laterality: N/A;  . TONSILLECTOMY      OB History    No data available       Home Medications    Prior to Admission medications   Medication Sig Start Date End Date Taking? Authorizing Provider  topiramate (TOPAMAX) 100 MG tablet Take 2 tablets (200 mg total) by mouth at bedtime. 09/22/16  Yes Everlena Cooper, Adam R, DO  omeprazole (PRILOSEC) 20 MG capsule Take 1 capsule (20 mg total) by mouth daily. 10/16/16 11/15/16  Mathews Robinsons B, PA-C  ondansetron (ZOFRAN ODT) 4 MG disintegrating tablet Take 1 tablet (4 mg total) by mouth every 8 (eight) hours as needed for nausea or vomiting. 10/16/16   Mathews Robinsons B, PA-C  SUMAtriptan (IMITREX) 20 MG/ACT nasal spray Spray once in one nostril.  May repeat x1 in 2 hours if headache persists or recurs. Patient not taking: Reported on 10/10/2016 09/22/16   Drema Dallas, DO  traMADol (ULTRAM) 50 MG tablet Take 1 tablet (50 mg total) by mouth 3 (three) times daily. Patient not taking: Reported on 09/22/2016 11/04/15   Lenn Sink, DPM  traMADol (ULTRAM) 50 MG tablet Take 200 mg by mouth at bedtime.    [provider]  ZOLMitriptan (ZOMIG) 2.5 MG tablet Take 1 tablet earliest onset of migraine.  May repeat once in 2 hours if headache persists or recurs. Patient not taking: Reported on 10/10/2016 06/13/16   Drema Dallas, DO    Family History Family History  Problem  Relation Age of Onset  . Diabetes Mother   . Heart disease Mother        AMI  . Heart failure Father   . Heart disease Father        CHF with defibrillator.  . Hypertension Sister   . Polycystic ovary syndrome Daughter     Social History Social History  Substance Use Topics  . Smoking status: Never Smoker  . Smokeless tobacco: Never Used  . Alcohol use Yes     Comment: occasionally, 1/month     Allergies   Bactrim [sulfamethoxazole-trimethoprim]   Review of  Systems Review of Systems  Constitutional: Negative for activity change, appetite change, chills and fever.  Respiratory: Negative for cough, shortness of breath, wheezing and stridor.   Cardiovascular: Negative for chest pain and palpitations.  Gastrointestinal: Positive for abdominal pain and nausea. Negative for abdominal distention, blood in stool, constipation, diarrhea and vomiting.  Genitourinary: Negative for difficulty urinating, dysuria, flank pain, frequency, hematuria and pelvic pain.  Musculoskeletal: Negative for myalgias.  Skin: Negative for color change, pallor and rash.  Neurological: Negative for dizziness, seizures, syncope, weakness and light-headedness.     Physical Exam Updated Vital Signs BP (!) 98/58 (BP Location: Left Arm)   Pulse 62   Temp 98 F (36.7 C) (Oral)   Resp 18   Ht 5\' 2"  (1.575 m)   Wt 63.5 kg (140 lb)   SpO2 97%   BMI 25.61 kg/m   Physical Exam  Constitutional: She appears well-developed and well-nourished. No distress.  Afebrile, nontoxic-appearing, sitting in bed in discomfort. No acute distress.  HENT:  Head: Normocephalic and atraumatic.  Eyes: Conjunctivae and EOM are normal.  Neck: Normal range of motion. Neck supple.  Cardiovascular: Normal rate, regular rhythm and normal heart sounds.   No murmur heard. Pulmonary/Chest: Effort normal and breath sounds normal. No respiratory distress. She has no wheezes. She has no rales.  Abdominal: Soft. She exhibits no distension and no mass. There is tenderness. There is no rebound and no guarding.  Flat contour, active bowel sounds. abdomen is supple and mildly tender to deep palpation of the RUQ and no rebound tenderness. No palpated masses.  No costovertebral angle tenderness. Negative murphy's sign Negative McBurney's point tenderness   Musculoskeletal: Normal range of motion. She exhibits no edema or deformity.  Neurological: She is alert.  Skin: Skin is warm and dry. No rash noted. She  is not diaphoretic. No erythema. No pallor.  Psychiatric: She has a normal mood and affect.  Nursing note and vitals reviewed.    ED Treatments / Results  Labs (all labs ordered are listed, but only abnormal results are displayed) Labs Reviewed  URINALYSIS, ROUTINE W REFLEX MICROSCOPIC - Abnormal; Notable for the following:       Result Value   APPearance CLOUDY (*)    Hgb urine dipstick TRACE (*)    Bilirubin Urine SMALL (*)    Leukocytes, UA MODERATE (*)    All other components within normal limits  COMPREHENSIVE METABOLIC PANEL - Abnormal; Notable for the following:    Chloride 113 (*)    CO2 19 (*)    Glucose, Bld 133 (*)    All other components within normal limits  URINALYSIS, MICROSCOPIC (REFLEX) - Abnormal; Notable for the following:    Bacteria, UA FEW (*)    Squamous Epithelial / LPF 0-5 (*)    All other components within normal limits  URINE CULTURE  LIPASE, BLOOD  CBC WITH DIFFERENTIAL/PLATELET  EKG  EKG Interpretation None       Radiology No results found.  Procedures Procedures (including critical care time)  Medications Ordered in ED Medications  ondansetron (ZOFRAN) injection 4 mg (4 mg Intravenous Given 10/16/16 0937)  morphine 4 MG/ML injection 4 mg (4 mg Intravenous Given 10/16/16 0937)  sodium chloride 0.9 % bolus 1,000 mL (0 mLs Intravenous Stopped 10/16/16 1023)  gi cocktail (Maalox,Lidocaine,Donnatal) (30 mLs Oral Given 10/16/16 1029)  morphine 4 MG/ML injection 4 mg (4 mg Intravenous Given 10/16/16 1105)     Initial Impression / Assessment and Plan / ED Course  I have reviewed the triage vital signs and the nursing notes.  Pertinent labs & imaging results that were available during my care of the patient were reviewed by me and considered in my medical decision making (see chart for details).    Patient presents via EMS with sudden onset upper abdominal pain, nausea, no vomiting. She is afebrile, non-toxic.  Patient was given IV  fluids and analgesia Labs ordered No urinary symptoms, U/A contaminated with squamous cells present. GI cocktail given and successful PO challenge.  On reassessment, patient reported improvement. I suspect peptic ulcer as possible etiology of patient's symptoms.  Epigastric/RUQ pain, s/p cholecystectomy, negative lipase and unremarkable workup. I do not think that imaging with CT is indicated at this time.  Patient was sleeping or resting comfortably on multiple reassessments. No vomiting while in the emergency department. Nausea improved.  Instructed patient to avoid alcohol, smoking, spicy foods, NSAIDs. Discharged with PPI and follow-up with gastroenterology for possible endoscopy.  Discussed strict return precautions and advised to return to the emergency department if experiencing any new or worsening symptoms. Instructions were understood and patient agreed with discharge plan.  Final Clinical Impressions(s) / ED Diagnoses   Final diagnoses:  Epigastric pain    New Prescriptions New Prescriptions   OMEPRAZOLE (PRILOSEC) 20 MG CAPSULE    Take 1 capsule (20 mg total) by mouth daily.   ONDANSETRON (ZOFRAN ODT) 4 MG DISINTEGRATING TABLET    Take 1 tablet (4 mg total) by mouth every 8 (eight) hours as needed for nausea or vomiting.     Georgiana Shore, PA-C 10/16/16 1219    Rolan Bucco, MD 10/16/16 1322

## 2016-10-16 NOTE — Discharge Instructions (Signed)
As discussed, your labs were reassuring today. Your pain could be due to ulcers. Make sure that you take omeprazole daily, avoid tobacco, nsaids, spicy foods, alcohol and follow up with gastroenterology for further evaluation and possible endoscopy.  Start reintroducing foods slowly starting with liquids, broths, soups and bland foods. Follow up with your primary care provider. Return if symptoms worsen or you experience new concerning symptoms in the meantime.

## 2016-10-16 NOTE — ED Triage Notes (Signed)
Pt reports 30-45 minutes of upper abd pain. +nausea. Pt states that she felt like she was going to pass out. Pt transported by EMS from her place of work.

## 2016-10-16 NOTE — ED Triage Notes (Signed)
EMS transport from work [employer]) with c/o abdominal pain. No n/v. Recent death of family member. Also has stress induced migraines

## 2016-10-16 NOTE — ED Notes (Signed)
Pt c/o increased pain. PA made aware. New orders received.

## 2016-10-16 NOTE — ED Notes (Signed)
Pt tolerating PO fluids at this time.

## 2016-10-17 ENCOUNTER — Encounter: Payer: Self-pay | Admitting: Physician Assistant

## 2016-10-17 ENCOUNTER — Ambulatory Visit: Payer: Self-pay | Admitting: Neurology

## 2016-10-17 LAB — URINE CULTURE: Culture: <10000 — AB

## 2016-10-22 ENCOUNTER — Emergency Department (HOSPITAL_BASED_OUTPATIENT_CLINIC_OR_DEPARTMENT_OTHER)
Admission: EM | Admit: 2016-10-22 | Discharge: 2016-10-22 | Disposition: A | Discharge status: Home / Self Care | Payer: BLUE CROSS/BLUE SHIELD | Attending: Emergency Medicine | Admitting: Emergency Medicine

## 2016-10-22 ENCOUNTER — Encounter (HOSPITAL_BASED_OUTPATIENT_CLINIC_OR_DEPARTMENT_OTHER): Payer: Self-pay | Admitting: Emergency Medicine

## 2016-10-22 DIAGNOSIS — Z79899 Other long term (current) drug therapy: Secondary | ICD-10-CM | POA: Insufficient documentation

## 2016-10-22 DIAGNOSIS — R51 Headache: Secondary | ICD-10-CM | POA: Diagnosis not present

## 2016-10-22 DIAGNOSIS — R519 Headache, unspecified: Secondary | ICD-10-CM

## 2016-10-22 MED ORDER — DEXAMETHASONE SODIUM PHOSPHATE 10 MG/ML IJ SOLN
10.0000 mg | Freq: Once | INTRAMUSCULAR | Status: AC
Start: 2016-10-22 — End: 2016-10-22
  Administered 2016-10-22: 10 mg via INTRAVENOUS
  Filled 2016-10-22: qty 1

## 2016-10-22 MED ORDER — DIPHENHYDRAMINE HCL 50 MG/ML IJ SOLN
25.0000 mg | Freq: Once | INTRAMUSCULAR | Status: AC
  Administered 2016-10-22: 25 mg via INTRAVENOUS
  Filled 2016-10-22: qty 1

## 2016-10-22 MED ORDER — SODIUM CHLORIDE 0.9 % IV BOLUS (SEPSIS)
1000.0000 mL | Freq: Once | INTRAVENOUS | Status: AC
  Administered 2016-10-22: 1000 mL via INTRAVENOUS

## 2016-10-22 MED ORDER — PROCHLORPERAZINE EDISYLATE 5 MG/ML IJ SOLN
10.0000 mg | Freq: Once | INTRAMUSCULAR | Status: AC
  Administered 2016-10-22: 10 mg via INTRAVENOUS
  Filled 2016-10-22: qty 2

## 2016-10-22 NOTE — ED Notes (Signed)
Pt educated about not driving or performing other critical tasks (such as operating heavy machinery, caring for infant/toddler/child) due to sedative nature of medications received in ED. Also warned about risks of consuming alcohol or taking other medications with sedative properties. Pt/caregiver verbalized understanding.  

## 2016-10-22 NOTE — ED Notes (Signed)
Pt left prior to receiving discharge paperwork. Looked and called for pt in waiting room -- no answer. Pt not seen in parking lot.

## 2016-10-22 NOTE — ED Provider Notes (Signed)
[facility]-EMERGENCY DEPT Provider Note   CSN: [id number] Arrival date & time: 10/22/16  1240     History   Chief Complaint Chief Complaint  Patient presents with  . Migraine    HPI [name] is a 51 y.o. female.  HPI   Reports severe stress recently. Father died 4 mos ago, Mom died 10 mos ago, and their birthdays have both been this week, she has stress with work, interactions with sister.  Reports headache for 11 days, located in the back of the head with radiation towards the front,  Has never had one last this long, usually they last about 5 days. Sees Neurologist for headaches. She reports the headaches are likely stress related.  It is a constant dull ache.  Has tried topamax, tried bc powders without relief.  Worsened by loud noises.  Nausea, no vomiting.  No fevers, trauma.  No numbness/weakness. Vision blurs then blinks and it gets better. No double vision or visual field deficits.  Headache was severe.   Past Medical History:  Diagnosis Date  . Anxiety   . Chronic headaches   . Depression   . Gall stones   . Plantar fasciitis, bilateral     Patient Active Problem List   Diagnosis Date Noted  . OSA (obstructive sleep apnea) 05/29/2016  . Hypersomnia 04/06/2016  . Lumbar degenerative disc disease 05/07/2015  . Other mixed anxiety disorders 08/28/2014  . S/P laparoscopic cholecystectomy 07/05/2011  . BREAST PAIN 05/19/2009  . CERVICAL STRAIN 05/19/2009  . ALLERGIC RHINITIS 06/22/2008  . Migraine 06/22/2008  . DEPRESSION, MAJOR, MODERATE 06/15/2008  . PARESTHESIA 06/15/2008  . PULMONARY NODULE 04/17/2007  . COUGH 04/17/2007  . Chest pain, atypical 04/17/2007  . MENORRHAGIA 07/02/2006  . NOCTURIA 04/26/2006    Past Surgical History:  Procedure Laterality Date  . CHOLECYSTECTOMY  06/09/2011   Procedure: LAPAROSCOPIC CHOLECYSTECTOMY;  Surgeon: Liz Malady, MD;  Location: [facility] OR;  Service: General;  Laterality: N/A;  . DILATION AND CURETTAGE OF UTERUS     . ERCP  06/08/2011   Procedure: ENDOSCOPIC RETROGRADE CHOLANGIOPANCREATOGRAPHY (ERCP);  Surgeon: Theda Belfast, MD;  Location: [facility] ENDOSCOPY;  Service: Endoscopy;  Laterality: N/A;  . TONSILLECTOMY      OB History    No data available       Home Medications    Prior to Admission medications   Medication Sig Start Date End Date Taking? Authorizing Provider  omeprazole (PRILOSEC) 20 MG capsule Take 1 capsule (20 mg total) by mouth daily. 10/16/16 11/15/16  Mathews Robinsons B, PA-C  ondansetron (ZOFRAN ODT) 4 MG disintegrating tablet Take 1 tablet (4 mg total) by mouth every 8 (eight) hours as needed for nausea or vomiting. 10/16/16   Mathews Robinsons B, PA-C  SUMAtriptan (IMITREX) 20 MG/ACT nasal spray Spray once in one nostril.  May repeat x1 in 2 hours if headache persists or recurs. Patient not taking: Reported on 10/10/2016 09/22/16   Drema Dallas, DO  topiramate (TOPAMAX) 100 MG tablet Take 2 tablets (200 mg total) by mouth at bedtime. 09/22/16   Drema Dallas, DO  traMADol (ULTRAM) 50 MG tablet Take 1 tablet (50 mg total) by mouth 3 (three) times daily. Patient not taking: Reported on 09/22/2016 11/04/15   Lenn Sink, DPM  traMADol (ULTRAM) 50 MG tablet Take 200 mg by mouth at bedtime.    [provider]  ZOLMitriptan (ZOMIG) 2.5 MG tablet Take 1 tablet earliest onset of migraine.  May repeat once in  2 hours if headache persists or recurs. Patient not taking: Reported on 10/10/2016 06/13/16   Drema Dallas, DO    Family History Family History  Problem Relation Age of Onset  . Diabetes Mother   . Heart disease Mother        AMI  . Heart failure Father   . Heart disease Father        CHF with defibrillator.  . Hypertension Sister   . Polycystic ovary syndrome Daughter     Social History Social History  Substance Use Topics  . Smoking status: Never Smoker  . Smokeless tobacco: Never Used  . Alcohol use Yes     Comment: occasionally, 1/month     Allergies     Bactrim [sulfamethoxazole-trimethoprim]   Review of Systems Review of Systems  Constitutional: Negative for fever.  HENT: Negative for sore throat.   Eyes: Negative for visual disturbance (not currently).  Respiratory: Negative for cough and shortness of breath.   Cardiovascular: Negative for chest pain.  Gastrointestinal: Positive for nausea. Negative for abdominal pain.  Genitourinary: Negative for difficulty urinating.  Musculoskeletal: Negative for back pain and neck pain.  Skin: Negative for rash.  Neurological: Positive for headaches. Negative for syncope, facial asymmetry, weakness and numbness.  Psychiatric/Behavioral: Negative for suicidal ideas.     Physical Exam Updated Vital Signs BP 109/76 (BP Location: Right Arm)   Pulse 68   Temp 98.3 F (36.8 C) (Oral)   Resp 16   Ht 5\' 2"  (1.575 m)   Wt 62.6 kg (138 lb)   SpO2 100%   BMI 25.24 kg/m   Physical Exam  Constitutional: She is oriented to person, place, and time. She appears well-developed and well-nourished. No distress.  HENT:  Head: Normocephalic and atraumatic.  Eyes: Conjunctivae and EOM are normal.  Neck: Normal range of motion.  Cardiovascular: Normal rate, regular rhythm, normal heart sounds and intact distal pulses.  Exam reveals no gallop and no friction rub.   No murmur heard. Pulmonary/Chest: Effort normal and breath sounds normal. No respiratory distress. She has no wheezes. She has no rales.  Abdominal: Soft. She exhibits no distension. There is no tenderness. There is no guarding.  Musculoskeletal: She exhibits no edema or tenderness.  Neurological: She is alert and oriented to person, place, and time. She has normal strength. No cranial nerve deficit or sensory deficit. Coordination normal. GCS eye subscore is 4. GCS verbal subscore is 5. GCS motor subscore is 6.  Skin: Skin is warm and dry. No rash noted. She is not diaphoretic. No erythema.  Nursing note and vitals reviewed.    ED  Treatments / Results  Labs (all labs ordered are listed, but only abnormal results are displayed) Labs Reviewed - No data to display  EKG  EKG Interpretation None       Radiology No results found.  Procedures Procedures (including critical care time)  Medications Ordered in ED Medications  sodium chloride 0.9 % bolus 1,000 mL (0 mLs Intravenous Stopped 10/22/16 1511)  prochlorperazine (COMPAZINE) injection 10 mg (10 mg Intravenous Given 10/22/16 1351)  diphenhydrAMINE (BENADRYL) injection 25 mg (25 mg Intravenous Given 10/22/16 1351)  dexamethasone (DECADRON) injection 10 mg (10 mg Intravenous Given 10/22/16 1351)     Initial Impression / Assessment and Plan / ED Course  I have reviewed the triage vital signs and the nursing notes.  Pertinent labs & imaging results that were available during my care of the patient were reviewed by me and considered  in my medical decision making (see chart for details).     51yo female with history of headaches, depression, presents with concern of headache.  Headache began slowly, no trauma, no fevers, and normal neurologic exam and have low suspicion for [facility], SDH or meningitis. Reports she is under a lot of stress, denies SI. Patient was given compazine, decadron and benadryl with improvement in headache.  Patient discharged in stable condition with understanding of reasons to return.    Final Clinical Impressions(s) / ED Diagnoses   Final diagnoses:  Nonintractable headache, unspecified chronicity pattern, unspecified headache type    New Prescriptions Discharge Medication List as of 10/22/2016  4:15 PM       Alvira Monday, MD 10/23/16 1135

## 2016-10-22 NOTE — ED Triage Notes (Signed)
Pt reports migraine x 11 days; home meds not helping

## 2016-10-22 NOTE — ED Notes (Signed)
ED Provider at bedside. 

## 2016-10-22 NOTE — ED Notes (Signed)
Pt was laying in Bed. Pt up for discharge. Pt room was needed. Moved pt in bed to hallway bed. Pt verablize understanding.

## 2016-11-02 ENCOUNTER — Ambulatory Visit (INDEPENDENT_AMBULATORY_CARE_PROVIDER_SITE_OTHER): Payer: BLUE CROSS/BLUE SHIELD | Admitting: Podiatry

## 2016-11-02 ENCOUNTER — Ambulatory Visit (INDEPENDENT_AMBULATORY_CARE_PROVIDER_SITE_OTHER): Payer: BLUE CROSS/BLUE SHIELD

## 2016-11-02 ENCOUNTER — Encounter: Payer: Self-pay | Admitting: Podiatry

## 2016-11-02 DIAGNOSIS — M722 Plantar fascial fibromatosis: Secondary | ICD-10-CM

## 2016-11-02 MED ORDER — TRIAMCINOLONE ACETONIDE 10 MG/ML IJ SUSP
10.0000 mg | Freq: Once | INTRAMUSCULAR | Status: AC
  Administered 2016-11-02: 10 mg

## 2016-11-06 ENCOUNTER — Ambulatory Visit: Payer: Self-pay | Admitting: Physician Assistant

## 2016-11-08 NOTE — Progress Notes (Signed)
Subjective:    Patient ID: [name], female   DOB: 51 y.o.   MRN: [id number]   HPI patient presents stating that she's had a reoccurrence of pain in the heel region bilateral and did well with that last time    ROS      Objective:  Physical Exam neurovascular status intact with inflammation which is reoccurred in the plantar fascial bilateral     Assessment:    Acute plantar fasciitis with inflammation of the heel region bilateral   Plan:   Reinjected the plantar fascia bilateral 3 mg Kenalog 5 mg Xylocaine and instructed on continued supportive shoes and physical therapy

## 2016-11-14 DIAGNOSIS — F4310 Post-traumatic stress disorder, unspecified: Secondary | ICD-10-CM | POA: Diagnosis not present

## 2016-11-14 DIAGNOSIS — G4733 Obstructive sleep apnea (adult) (pediatric): Secondary | ICD-10-CM | POA: Diagnosis not present

## 2016-11-14 DIAGNOSIS — F3340 Major depressive disorder, recurrent, in remission, unspecified: Secondary | ICD-10-CM | POA: Diagnosis not present

## 2016-11-14 DIAGNOSIS — F411 Generalized anxiety disorder: Secondary | ICD-10-CM | POA: Diagnosis not present

## 2016-11-16 ENCOUNTER — Ambulatory Visit: Payer: Self-pay | Admitting: Physician Assistant

## 2016-11-17 DIAGNOSIS — N3001 Acute cystitis with hematuria: Secondary | ICD-10-CM | POA: Diagnosis not present

## 2016-11-22 ENCOUNTER — Ambulatory Visit (INDEPENDENT_AMBULATORY_CARE_PROVIDER_SITE_OTHER): Payer: BLUE CROSS/BLUE SHIELD | Admitting: Physician Assistant

## 2016-11-22 ENCOUNTER — Encounter: Payer: Self-pay | Admitting: Physician Assistant

## 2016-11-22 VITALS — BP 90/68 | HR 64 | Ht 62.0 in | Wt 138.6 lb

## 2016-11-22 DIAGNOSIS — R1013 Epigastric pain: Secondary | ICD-10-CM | POA: Diagnosis not present

## 2016-11-22 DIAGNOSIS — K5900 Constipation, unspecified: Secondary | ICD-10-CM

## 2016-11-22 DIAGNOSIS — R12 Heartburn: Secondary | ICD-10-CM

## 2016-11-22 MED ORDER — POLYETHYLENE GLYCOL 3350 17 GM/SCOOP PO POWD: 17.0000 g | Freq: Every day | ORAL | 3 refills | Status: DC

## 2016-11-22 MED ORDER — OMEPRAZOLE 40 MG PO CPDR: 40.0000 mg | DELAYED_RELEASE_CAPSULE | Freq: Every day | ORAL | 2 refills | Status: DC

## 2016-11-22 NOTE — Patient Instructions (Signed)
We have sent the following medications to your pharmacy for you to pick up at your convenience: Polyethylene Glycol 17 grams daily Omeprazole 40 mg daily

## 2016-11-22 NOTE — Progress Notes (Addendum)
Chief Complaint: Epigastric pain, heartburn and constipation  HPI:  [name] is a 51 year old Caucasian female with a past medical history as listed below, who was referred to me by Copland, Gwenlyn Found, MD for a complaint of epigastric pain, heartburn and constipation .      Today, the patient begins by telling me that she has chronic constipation. She cannot have a bowel movement without a laxative. She has been using laxatives once a week in order to have full evacuation of bowels for the past 20 years or more. She has used MiraLAX up to 4 doses a day and has switched to something she calls "calm". Apparently this product has magnesium oxide in it. Patient tells me that if she does not use laxatives she will not go to the bathroom. She denies blood in her stool.   Patient also describes today that she has an epigastric pain associated with heartburn. Patient tells me that she had an episode of epigastric pain which occurred while at work and she become diaphoretic as well as shaky and was taken to the ER. She was found to have a low blood pressure and they also discussed that she probably had reflux. Patient was started on Omeprazole 20 mg once daily for 20 tablets. She tells me this did help a little bit. She has always had chronic heartburn symptoms but this epigastric pain is new. Patient tells me she isundergoing a lot of stress in her life due to some family complications after her father passed away recently and when she becomes stressed or anxious, her epigastric pain is worse. Patient tells me she just doesn't eat a lot in general.   Patient denies fever, chills, melena, weight loss, anorexia, change in diet, vomiting, dysphagia or symptoms that awaken her at night.   Past Medical History:  Diagnosis Date  . Anxiety   . Chronic headaches   . Depression   . Gall stones   . Plantar fasciitis, bilateral     Past Surgical History:  Procedure Laterality Date  . CHOLECYSTECTOMY   06/09/2011   Procedure: LAPAROSCOPIC CHOLECYSTECTOMY;  Surgeon: Liz Malady, MD;  Location: [facility] OR;  Service: General;  Laterality: N/A;  . DILATION AND CURETTAGE OF UTERUS    . ERCP  06/08/2011   Procedure: ENDOSCOPIC RETROGRADE CHOLANGIOPANCREATOGRAPHY (ERCP);  Surgeon: Theda Belfast, MD;  Location: [facility] ENDOSCOPY;  Service: Endoscopy;  Laterality: N/A;  . TONSILLECTOMY      Current Outpatient Prescriptions  Medication Sig Dispense Refill  . cephALEXin (KEFLEX) 500 MG capsule Take 1 capsule by mouth 2 (two) times daily.    Marland Kitchen omeprazole (PRILOSEC) 40 MG capsule Take 1 capsule (40 mg total) by mouth daily. 30 capsule 2  . polyethylene glycol powder (GLYCOLAX/MIRALAX) powder Take 17 g by mouth daily. 255 g 3  . topiramate (TOPAMAX) 100 MG tablet Take 2 tablets (200 mg total) by mouth at bedtime. 60 tablet 2   No current facility-administered medications for this visit.     Allergies as of 11/22/2016 - Review Complete 11/22/2016  Allergen Reaction Noted  . Bactrim [sulfamethoxazole-trimethoprim] Rash and Other (See Comments) 11/04/2015    Family History  Problem Relation Age of Onset  . Diabetes Mother   . Heart disease Mother        AMI  . Heart failure Father   . Heart disease Father        CHF with defibrillator.  . Hypertension Sister   . Polycystic ovary syndrome Daughter  Social History   Social History  . Marital status: Legally Separated    Spouse name: Loraine Leriche  . Number of children: N/A  . Years of education: 12th   Occupational History  . 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 Smoking status: Never Smoker  . Smokeless tobacco: Never Used  . Alcohol use Yes     Comment: occasionally, 1/month  . Drug use: No  . Sexual activity: Yes    Birth control/ protection: Injection, None     Comment: no menstruation in "years."   Other Topics Concern  . Not on file   Social History Narrative   Marital status: Married       Children: 3 children      Lives: with daughter and husband      Employment: works at [employer] in [place]      Tobacco; none      Alcohol:  Socially; weekends.      Drugs: none     Exercise: none    Review of Systems:    Constitutional:Positive for fatigue  Skin: No rash  Cardiovascular: No chest pain Respiratory: No SOB  Gastrointestinal: See HPI and otherwise negative Genitourinary: No dysuria Neurological:Positive for headaches  Musculoskeletal:Positive for back pain  Hematologic: No bleeding Psychiatric: Positive for depression and anxiety   Physical Exam:  Vital signs: BP 90/68   Pulse 64   Ht  (1.575 m)   Wt 138 lb 9.6 oz (62.9 kg)   BMI 25.35 kg/m   Constitutional:   Pleasant Caucasian female appears to be in NAD, Well developed, Well nourished, alert and cooperative Head:  Normocephalic and atraumatic. Eyes:   PEERL, EOMI. No icterus. Conjunctiva pink. Ears:  Normal auditory acuity. Neck:  Supple Throat: Oral cavity and pharynx without inflammation, swelling or lesion.  Respiratory: Respirations even and unlabored. Lungs clear to auscultation bilaterally.   No wheezes, crackles, or rhonchi.  Cardiovascular: Normal S1, S2. No MRG. Regular rate and rhythm. No peripheral edema, cyanosis or pallor.  Gastrointestinal:  Soft, nondistended, very mild ttp in epigastrum, No rebound or guarding. Normal bowel sounds. No appreciable masses or hepatomegaly. Rectal:  Not performed.  Msk:  Symmetrical without gross deformities. Without edema, no deformity or joint abnormality.  Neurologic:  Alert and  oriented x4;  grossly normal neurologically.  Skin:   Dry and intact without significant lesions or rashes. Psychiatric:  Demonstrates good judgement and reason without abnormal affect or behaviors.  MOST RECENT LABS AND IMAGING: CBC    Component Value Date/Time   WBC 8.5 10/16/2016 0930   RBC 4.41 10/16/2016 0930   HGB 14.2 10/16/2016 0930   HCT 41.7 10/16/2016 0930    PLT 157 10/16/2016 0930   MCV 94.6 10/16/2016 0930   MCV 90.2 12/20/2015 1230   MCH 32.2 10/16/2016 0930   MCHC 34.1 10/16/2016 0930   RDW 12.7 10/16/2016 0930   LYMPHSABS 1.4 10/16/2016 0930   MONOABS 0.4 10/16/2016 0930   EOSABS 0.1 10/16/2016 0930   BASOSABS 0.0 10/16/2016 0930    CMP     Component Value Date/Time   NA 141 10/16/2016 0930   K 3.6 10/16/2016 0930   CL 113 (H) 10/16/2016 0930   CO2 19 (L) 10/16/2016 0930   GLUCOSE 133 (H) 10/16/2016 0930   BUN 11 10/16/2016 0930   CREATININE 0.76 10/16/2016 0930   CREATININE 0.61 05/27/2015 1900   CALCIUM 9.6 10/16/2016 0930  PROT 6.5 10/16/2016 0930   ALBUMIN 4.0 10/16/2016 0930   AST 22 10/16/2016 0930   ALT 17 10/16/2016 0930   ALKPHOS 46 10/16/2016 0930   BILITOT 0.4 10/16/2016 0930   GFRNONAA >60 10/16/2016 0930   GFRAA >60 10/16/2016 0930    Assessment: 1. Epigastric pain: New for the patient, likely related to increase in stress and anxiety lately and heartburn/gastritis/functional dyspepsia 2. Heartburn: Chronic for the patient, helped with Omeprazole low dose in the past 3. Constipation: Chronic for the patient, typically relieved with a bowel purge once a week  Plan: 1. Discussed with the patient today that I believe a lot of her new complaints including epigastric pain are related to stress/anxiety in her life after the death of her father. She becomes tearful and agrees with me. 2. Prescribed Omeprazole 40 mg once daily, 30-60 minutes before breakfast. #30 with 2 refills 3. Would recommend the patient start a daily bowel regimen rather than doing a bowel purge once a week. Prescribed polyethylene glycol. Discussed patient should take 1 dose every day. She can increase this up to 4 doses a day if necessary 4. Reviewed basic constipation relief including drinking water 6-8 8 ounce glasses a day, increasing fiber in her diet and exercise 5. Recommend the patient find a way to reduce stress and anxiety in her  life 6. Patient to follow clinic in 4-6 weeks with me. She was assigned Dr. Rhea Belton today.  Roberta Meeker, PA-C [facility] 11/22/2016, 11:44 AM  Cc: Pearline Cables, MD   Addendum: Reviewed and agree with initial management. Pyrtle, Carie Caddy, MD

## 2016-12-05 DIAGNOSIS — F4310 Post-traumatic stress disorder, unspecified: Secondary | ICD-10-CM | POA: Diagnosis not present

## 2016-12-05 DIAGNOSIS — F3340 Major depressive disorder, recurrent, in remission, unspecified: Secondary | ICD-10-CM | POA: Diagnosis not present

## 2016-12-05 DIAGNOSIS — F411 Generalized anxiety disorder: Secondary | ICD-10-CM | POA: Diagnosis not present

## 2016-12-14 DIAGNOSIS — G4733 Obstructive sleep apnea (adult) (pediatric): Secondary | ICD-10-CM | POA: Diagnosis not present

## 2016-12-25 ENCOUNTER — Ambulatory Visit: Payer: Self-pay | Admitting: Physician Assistant

## 2016-12-25 ENCOUNTER — Other Ambulatory Visit: Payer: Self-pay | Admitting: Neurology

## 2016-12-26 DIAGNOSIS — F3340 Major depressive disorder, recurrent, in remission, unspecified: Secondary | ICD-10-CM | POA: Diagnosis not present

## 2016-12-26 DIAGNOSIS — F411 Generalized anxiety disorder: Secondary | ICD-10-CM | POA: Diagnosis not present

## 2016-12-26 DIAGNOSIS — F4310 Post-traumatic stress disorder, unspecified: Secondary | ICD-10-CM | POA: Diagnosis not present

## 2016-12-29 DIAGNOSIS — Z23 Encounter for immunization: Secondary | ICD-10-CM | POA: Diagnosis not present

## 2016-12-29 DIAGNOSIS — J00 Acute nasopharyngitis [common cold]: Secondary | ICD-10-CM | POA: Diagnosis not present

## 2016-12-29 DIAGNOSIS — H66003 Acute suppurative otitis media without spontaneous rupture of ear drum, bilateral: Secondary | ICD-10-CM | POA: Diagnosis not present

## 2017-01-11 ENCOUNTER — Ambulatory Visit: Payer: Self-pay | Admitting: Neurology

## 2017-01-23 DIAGNOSIS — F411 Generalized anxiety disorder: Secondary | ICD-10-CM | POA: Diagnosis not present

## 2017-01-23 DIAGNOSIS — F4310 Post-traumatic stress disorder, unspecified: Secondary | ICD-10-CM | POA: Diagnosis not present

## 2017-01-23 DIAGNOSIS — F3340 Major depressive disorder, recurrent, in remission, unspecified: Secondary | ICD-10-CM | POA: Diagnosis not present

## 2017-01-30 DIAGNOSIS — G4733 Obstructive sleep apnea (adult) (pediatric): Secondary | ICD-10-CM | POA: Diagnosis not present

## 2017-02-02 ENCOUNTER — Ambulatory Visit: Payer: BLUE CROSS/BLUE SHIELD | Admitting: Podiatry

## 2017-02-02 ENCOUNTER — Other Ambulatory Visit: Payer: Self-pay | Admitting: Podiatry

## 2017-02-02 ENCOUNTER — Ambulatory Visit (INDEPENDENT_AMBULATORY_CARE_PROVIDER_SITE_OTHER): Payer: BLUE CROSS/BLUE SHIELD

## 2017-02-02 DIAGNOSIS — M79671 Pain in right foot: Secondary | ICD-10-CM | POA: Diagnosis not present

## 2017-02-02 DIAGNOSIS — M722 Plantar fascial fibromatosis: Secondary | ICD-10-CM

## 2017-02-02 MED ORDER — TRAMADOL HCL 50 MG PO TABS: 50.0000 mg | ORAL_TABLET | Freq: Three times a day (TID) | ORAL | 2 refills | Status: DC

## 2017-02-02 MED ORDER — TRIAMCINOLONE ACETONIDE 10 MG/ML IJ SUSP
10.0000 mg | Freq: Once | INTRAMUSCULAR | Status: AC
  Administered 2017-02-02: 10 mg

## 2017-02-07 ENCOUNTER — Encounter: Payer: Self-pay | Admitting: Family Medicine

## 2017-02-07 ENCOUNTER — Ambulatory Visit: Payer: BLUE CROSS/BLUE SHIELD | Admitting: Family Medicine

## 2017-02-07 VITALS — BP 132/90 | HR 128 | Temp 97.8°F | Ht 62.0 in | Wt 137.2 lb

## 2017-02-07 DIAGNOSIS — F321 Major depressive disorder, single episode, moderate: Secondary | ICD-10-CM | POA: Diagnosis not present

## 2017-02-07 DIAGNOSIS — N898 Other specified noninflammatory disorders of vagina: Secondary | ICD-10-CM | POA: Diagnosis not present

## 2017-02-07 DIAGNOSIS — R110 Nausea: Secondary | ICD-10-CM

## 2017-02-07 LAB — COMPREHENSIVE METABOLIC PANEL
ALBUMIN: 4.3 g/dL (ref 3.5–5.2)
ALT: 10 U/L (ref 0–35)
AST: 13 U/L (ref 0–37)
Alkaline Phosphatase: 52 U/L (ref 39–117)
BILIRUBIN TOTAL: 0.5 mg/dL (ref 0.2–1.2)
BUN: 14 mg/dL (ref 6–23)
CALCIUM: 10.1 mg/dL (ref 8.4–10.5)
CHLORIDE: 110 meq/L (ref 96–112)
CO2: 26 meq/L (ref 19–32)
CREATININE: 0.68 mg/dL (ref 0.40–1.20)
GFR: 97.51 mL/min (ref >60.00)
Glucose, Bld: 91 mg/dL (ref 70–99)
Potassium: 3.9 mEq/L (ref 3.5–5.1)
Sodium: 146 mEq/L — ABNORMAL HIGH (ref 135–145)
Total Protein: 6.8 g/dL (ref 6.0–8.3)

## 2017-02-07 LAB — CBC WITH DIFFERENTIAL/PLATELET
BASOS PCT: 0.7 % (ref 0.0–3.0)
Basophils Absolute: 0.1 10*3/uL (ref 0.0–0.1)
EOS ABS: 0.3 10*3/uL (ref 0.0–0.7)
Eosinophils Relative: 3.4 % (ref 0.0–5.0)
HEMATOCRIT: 43.6 % (ref 36.0–46.0)
HEMOGLOBIN: 14.3 g/dL (ref 12.0–15.0)
LYMPHS PCT: 29.7 % (ref 12.0–46.0)
Lymphs Abs: 2.4 10*3/uL (ref 0.7–4.0)
MCHC: 32.7 g/dL (ref 30.0–36.0)
MCV: 98.4 fl (ref 78.0–100.0)
MONOS PCT: 6.8 % (ref 3.0–12.0)
Monocytes Absolute: 0.6 10*3/uL (ref 0.1–1.0)
NEUTROS ABS: 4.9 10*3/uL (ref 1.4–7.7)
Neutrophils Relative %: 59.4 % (ref 43.0–77.0)
PLATELETS: 232 10*3/uL (ref 150.0–400.0)
RBC: 4.43 Mil/uL (ref 3.87–5.11)
RDW: 13.4 % (ref 11.5–15.5)
WBC: 8.2 10*3/uL (ref 4.0–10.5)

## 2017-02-07 LAB — LIPID PANEL
CHOLESTEROL: 221 mg/dL — AB (ref 0–200)
HDL: 75.4 mg/dL (ref >39.00)
LDL Cholesterol: 128 mg/dL — ABNORMAL HIGH (ref 0–99)
NonHDL: 145.31
TRIGLYCERIDES: 86 mg/dL (ref 0.0–149.0)
Total CHOL/HDL Ratio: 3
VLDL: 17.2 mg/dL (ref 0.0–40.0)

## 2017-02-07 LAB — TSH: TSH: 0.91 u[IU]/mL (ref 0.35–4.50)

## 2017-02-07 MED ORDER — CONJ ESTROG-MEDROXYPROGEST ACE 0.3-1.5 MG PO TABS: 1.0000 | ORAL_TABLET | Freq: Every day | ORAL | 3 refills | Status: DC

## 2017-02-07 NOTE — Assessment & Plan Note (Signed)
New- intermittent. Will check labs today. No other GI symptoms.

## 2017-02-07 NOTE — Assessment & Plan Note (Signed)
Continue psychotherapy.  She is refusing rx.

## 2017-02-07 NOTE — Progress Notes (Signed)
Subjective:   Patient ID: [name], female    DOB: 04/11/1965, 51 y.o.   MRN: [id number]  [name] is a pleasant 51 y.o. year old female who presents to clinic today with Establish Care (Patient is here today to establish care.); Nausea (She states that she stays nauseated when she eats or not.  Also has dizziness but is unsure if it is related.  She increased H2O but this still happens.); vaginal dryness (Patient states that she has not had a period in years.  She is experiencing vaginal dryness and inability to achieve orgasm.); and Depression (She also states that she is having a problem with depression.  Her father passed in April.  See screening.)  on 02/07/2017  HPI:  Nausea with increased thirst- going on for months. No vomiting. Has had decreased appetite but recently started Topamax (Dr. Everlena CooperJaffe). Has a h/o migraines. Remote h/o cholecystectomy.   Supposed to take miralax daily for constipation but only takes it as needed.  Depression- father died in April. Sees a counselor once a month. She does not want to be on any medication. PHQ 9 17.  Vaginal dryness with dyspareunia- ongoing issue for years.  Has not had a period in years, no PMB.  Has tried OTC lubricants without any success.  Current Outpatient Medications on File Prior to Visit  Medication Sig Dispense Refill  . polyethylene glycol powder (GLYCOLAX/MIRALAX) powder Take 17 g by mouth daily. 255 g 3  . topiramate (TOPAMAX) 100 MG tablet TAKE 2 TABLETS (200 MG TOTAL) BY MOUTH AT BEDTIME 60 tablet 2  . omeprazole (PRILOSEC) 40 MG capsule Take 1 capsule (40 mg total) by mouth daily. (Patient not taking: Reported on 02/07/2017) 30 capsule 2  . traMADol (ULTRAM) 50 MG tablet Take 1 tablet (50 mg total) by mouth 3 (three) times daily. (Patient not taking: Reported on 02/07/2017) 90 tablet 2   No current facility-administered medications on file prior to visit.     Allergies  Allergen Reactions  .  Bactrim [Sulfamethoxazole-Trimethoprim] Rash and Other (See Comments)    Pt states she developed a rash on arms, throat soreness, headache and ear pain.     Past Medical History:  Diagnosis Date  . Anxiety   . Chronic headaches   . Depression   . Gall stones   . Plantar fasciitis, bilateral     Past Surgical History:  Procedure Laterality Date  . CHOLECYSTECTOMY  06/09/2011   Procedure: LAPAROSCOPIC CHOLECYSTECTOMY;  Surgeon: Liz MaladyBurke E Thompson, MD;  [facility];  Service: General;  Laterality: N/A;  . DILATION AND CURETTAGE OF UTERUS    . ERCP  06/08/2011   Procedure: ENDOSCOPIC RETROGRADE CHOLANGIOPANCREATOGRAPHY (ERCP);  Surgeon: Theda BelfastPatrick D Hung, MD;  [facility];  Service: Endoscopy;  Laterality: N/A;  . TONSILLECTOMY      Family History  Problem Relation Age of Onset  . Diabetes Mother   . Heart disease Mother        AMI  . Heart failure Father   . Heart disease Father        CHF with defibrillator.  . Hypertension Sister   . Polycystic ovary syndrome Daughter     Social History   Socioeconomic History  . Marital status: Legally Separated    Spouse name: [name]  . Number of children: Not on file  . Years of education: 12th  . Highest education level: Not on file  Social Needs  . Financial resource strain: Not on  file  . Food insecurity - worry: Not on file  . Food insecurity - inability: Not on file  . Transportation needs - medical: Not on file  . Transportation needs - non-medical: Not on file  Occupational History  . Occupation: Merchandiser, retailCook/Attendant    Employer: [employer]  . Occupation: Waitress    Comment: [employer]  Tobacco Use  . Smoking status: Never Smoker  . Smokeless tobacco: Never Used  Substance and Sexual Activity  . Alcohol use: Yes    Comment: occasionally, 1/month  . Drug use: No  . Sexual activity: Yes    Birth control/protection: Injection, None    Comment: no menstruation in "years."  Other Topics Concern  . Not on file  Social  History Narrative   Marital status: Married      Children: 3 children      Lives: with daughter and husband      Em[place] at [employer] [place]      Tobacco; none      Alcohol:  Socially; weekends.      Drugs: none     Exercise: none   The PMH, PSH, Social History, Family History, Medications, and allergies [facility], and have been updated if relevant.   Review of Systems  Constitutional: Negative.   Gastrointestinal: Positive for nausea. Negative for abdominal distention, abdominal pain, anal bleeding, blood in stool, constipation, diarrhea, rectal pain and vomiting.  Endocrine: Positive for polydipsia and polyuria. Negative for polyphagia.  Genitourinary: Positive for dyspareunia and vaginal pain. Negative for decreased urine volume, dysuria, enuresis, flank pain, frequency, genital sores, hematuria, pelvic pain, urgency, vaginal bleeding and vaginal discharge.  Neurological: Negative.   Hematological: Negative.   Psychiatric/Behavioral: Positive for dysphoric mood. Negative for agitation, confusion, decreased concentration, hallucinations, self-injury, sleep disturbance and suicidal ideas. The patient is nervous/anxious. The patient is not hyperactive.   All other systems reviewed and are negative.      Objective:    BP 132/90 (BP Location: Left Arm, Patient Position: Sitting, Cuff Size: Normal)   Pulse (!) 128   Temp 97.8 F (36.6 C) (Oral)   Ht 5\' 2"  (1.575 m)   Wt 137 lb 3.2 oz (62.2 kg)   SpO2 98%   BMI 25.09 kg/m    Physical Exam   General:  Well-developed,well-nourished,in no acute distress; alert,appropriate and cooperative throughout examination Head:  normocephalic and atraumatic.   Eyes:  vision grossly intact, PERRL Ears:  R ear normal and L ear normal externally, TMs clear bilaterally Nose:  no external deformity.   Mouth:  good dentition.   Neck:  No deformities, masses, or tenderness noted. Lungs:  Normal respiratory effort,  chest expands symmetrically. Lungs are clear to auscultation, no crackles or wheezes. Heart:  Normal rate and regular rhythm. S1 and S2 normal without gallop, murmur, click, rub or other extra sounds. Msk:  No deformity or scoliosis noted of thoracic or lumbar spine.   Extremities:  No clubbing, cyanosis, edema, or deformity noted with normal full range of motion of all joints.   Neurologic:  alert & oriented X3 and gait normal.   Skin:  Intact without suspicious lesions or rashes Psych:  Cognition and judgment appear intact. Alert and cooperative with normal attention span and concentration. No apparent delusions, illusions, hallucinations      Assessment & Plan:   Nausea - Plan: CBC with Differential/Platelet, Comprehensive metabolic panel, Lipid panel, TSH, H. pylori antibody, IgG  Vaginal dryness  DEPRESSION, MAJOR, MODERATE No  Follow-up on file.

## 2017-02-07 NOTE — Progress Notes (Signed)
Subjective:   Patient ID: [name], female   DOB: 51 y.o.   MRN: [id number]   HPI Patient presents stating I developed a lot of pain in my mid arch and left and is worse when I get up in the morning after sitting in the right arch also hurts me but that has gradually been getting better as time goes on.   ROS      Objective:  Physical Exam  Neurovascular status intact with negative Homans sign noted and patient found to have discomfort in the left plantar fascia with mid arch discomfort right that she admits she has not been using the night splint as we had discussed previously.       Assessment:  Acute plantar fasciitis left with mid arch pain which is probably compensatory in nature.     Plan:  H&P conditions reviewed and on focusing on the left heel.  I injected the left fascia 3 mg Kenalog 5 mg Xylocaine advised on physical therapy anti-inflammatories and support shoes and also dispensed night splint for the left leg.  Patient will be seen back as needed

## 2017-02-07 NOTE — Assessment & Plan Note (Signed)
>  30 min spent with face to face with patient, >50% counseling and/or coordinating care Discussed risks and benefits of HRT.  Pt has a uterus and needs progesterone with estrogen to prevent endometrial hyperplasia (20-50% of patients on unopposed estrogen for 1 yr will develop hyperplasia). eRx sent for prempro.

## 2017-02-07 NOTE — Patient Instructions (Signed)
Great to meet you.  We are starting prempro- 1 tab by mouth daily.  I will call you with your lab results or you can view them online.

## 2017-02-08 ENCOUNTER — Other Ambulatory Visit: Payer: Self-pay | Admitting: Neurology

## 2017-02-08 ENCOUNTER — Telehealth: Payer: Self-pay | Admitting: General Practice

## 2017-02-08 ENCOUNTER — Other Ambulatory Visit: Payer: Self-pay | Admitting: Family Medicine

## 2017-02-08 LAB — H. PYLORI ANTIBODY, IGG: H PYLORI IGG: POSITIVE — AB

## 2017-02-08 MED ORDER — PROGESTERONE MICRONIZED 100 MG PO CAPS: 100.0000 mg | ORAL_CAPSULE | Freq: Every day | ORAL | 3 refills | Status: DC

## 2017-02-08 MED ORDER — OMEPRAZOLE 40 MG PO CPDR: 40.0000 mg | DELAYED_RELEASE_CAPSULE | Freq: Two times a day (BID) | ORAL | 0 refills | Status: DC

## 2017-02-08 MED ORDER — CLARITHROMYCIN 500 MG PO TABS: 500.0000 mg | ORAL_TABLET | Freq: Two times a day (BID) | ORAL | 0 refills | Status: DC

## 2017-02-08 MED ORDER — AMOXICILLIN 500 MG PO CAPS: 1000.0000 mg | ORAL_CAPSULE | Freq: Two times a day (BID) | ORAL | 0 refills | Status: AC

## 2017-02-08 MED ORDER — ESTROGENS CONJUGATED 0.3 MG PO TABS: 0.3000 mg | ORAL_TABLET | Freq: Every day | ORAL | 3 refills | Status: DC

## 2017-02-08 NOTE — Telephone Encounter (Signed)
TA-I discussed the need for the Abx for the H. Pylori etc  She said that she is not able to afford the Prempro as it is $45.00/she said that you mentioned that the 2 medications in the Prempro could be separated?  She said it was ok to send them in and then for me to leave her a message as she will be working tomorrow/plz advise/thx dmf

## 2017-02-08 NOTE — Telephone Encounter (Signed)
eRxs sent. 

## 2017-02-08 NOTE — Telephone Encounter (Signed)
Copied from CRM [phone]. Topic: Inquiry >> Feb 08, 2017  2:40 PM Stephannie LiSimmons, Claus Silvestro L, NT wrote: Reason for CRM: Has questions concerning prescriptions for the,antibiotics unsure reason for medication,  and also the hormone would like to know it can be called in separate for financial reasons, please advise  [phone] has to work tomorrow 6 am to 2pm please call today if possible

## 2017-02-09 NOTE — Telephone Encounter (Signed)
LMOVM that her request has been completed/thx dmf

## 2017-03-19 ENCOUNTER — Encounter: Payer: Self-pay | Admitting: Family Medicine

## 2017-03-19 ENCOUNTER — Ambulatory Visit: Payer: BLUE CROSS/BLUE SHIELD | Admitting: Family Medicine

## 2017-03-19 VITALS — BP 110/66 | HR 75 | Temp 98.0°F | Ht 62.0 in | Wt 138.2 lb

## 2017-03-19 DIAGNOSIS — R110 Nausea: Secondary | ICD-10-CM | POA: Diagnosis not present

## 2017-03-19 DIAGNOSIS — A048 Other specified bacterial intestinal infections: Secondary | ICD-10-CM

## 2017-03-19 NOTE — Progress Notes (Signed)
Subjective:   Patient ID: [name], female    DOB: 02/09/1966, 52 y.o.   MRN: [id number]  [name] is a pleasant 52 y.o. year old female who presents to clinic today with Nausea (Patient is here today C/O nausea.  She states that this is has been ongoing since last visit when she mentioned it on 12.12.18.  She states that she finished the Abx for the H. Pylori as instructed and is wondering if it is possible that it has not all gone.  It is not as bad as it was then but it is still very bothersome.) and Fatigue (Patient also states that she is having a hard time with being tired all the time.  This has been going on since before she stopped using her CPAP.  But it has worsened since stopping the CPAP. )  on 03/19/2017  HPI: Nausea and fatigue- when she established care with me last month, we tested her and subsequently treated her for H pylori.  Nausea and fatigued both improved some but still has days that feel just as bad as before she was diagnosed. Does not vomit. No diarrhea.  No black stools.  Admits to not always being compliant with her CPAP.  She saw [facility] for epigastric pain in 10/2016- notes reviewed.  Current Outpatient Medications on File Prior to Visit  Medication Sig Dispense Refill  . polyethylene glycol powder (GLYCOLAX/MIRALAX) powder Take 17 g by mouth daily. 255 g 3  . PREMPRO 0.3-1.5 MG tablet Take 1 tablet by mouth daily.    Marland Kitchen. topiramate (TOPAMAX) 100 MG tablet TAKE 2 TABLETS (200 MG TOTAL) BY MOUTH AT BEDTIME 60 tablet 2  . traMADol (ULTRAM) 50 MG tablet Take 1 tablet (50 mg total) by mouth 3 (three) times daily. 90 tablet 2   No current facility-administered medications on file prior to visit.     Allergies  Allergen Reactions  . Bactrim [Sulfamethoxazole-Trimethoprim] Rash and Other (See Comments)    Pt states she developed a rash on arms, throat soreness, headache and ear pain.     Past Medical History:  Diagnosis Date  .  Anxiety   . Chronic headaches   . Depression   . Gall stones   . Plantar fasciitis, bilateral     Past Surgical History:  Procedure Laterality Date  . CHOLECYSTECTOMY  06/09/2011   Procedure: LAPAROSCOPIC CHOLECYSTECTOMY;  Surgeon: Liz MaladyBurke E Thompson, MD;  [facility];  Service: General;  Laterality: N/A;  . DILATION AND CURETTAGE OF UTERUS    . ERCP  06/08/2011   Procedure: ENDOSCOPIC RETROGRADE CHOLANGIOPANCREATOGRAPHY (ERCP);  Surgeon: Theda BelfastPatrick D Hung, MD;  [facility];  Service: Endoscopy;  Laterality: N/A;  . TONSILLECTOMY      Family History  Problem Relation Age of Onset  . Diabetes Mother   . Heart disease Mother        AMI  . Heart failure Father   . Heart disease Father        CHF with defibrillator.  . Hypertension Sister   . Polycystic ovary syndrome Daughter     Social History   Socioeconomic History  . Marital status: Legally Separated    Spouse name: [name]  . Number of children: Not on file  . Years of education: 12th  . Highest education level: Not on file  Social Needs  . Financial resource strain: Not on file  . Food insecurity - worry: Not on file  . Food insecurity -  inability: Not on file  . Transportation needs - medical: Not on file  . Transportation needs - non-medical: Not on file  Occupational History  . Occupation: Merchandiser, retail: [employer]  . Occupation: Waitress    Comment: [employer]  Tobacco Use  . Smoking status: Never Smoker  . Smokeless tobacco: Never Used  Substance and Sexual Activity  . Alcohol use: Yes    Comment: occasionally, 1/month  . Drug use: No  . Sexual activity: Yes    Birth control/protection: Injection, None    Comment: no menstruation in "years."  Other Topics Concern  . Not on file  Social History Narrative   Marital status: Married      Children: 3 children      Lives: with daughter and husband      Employment: works at [employer] in [place]      Tobacco; none      Alcohol:  Socially;  weekends.      Drugs: none     Exercise: none   The PMH, PSH, Social History, Family History, Medications, and allergies have been reviewed in [facility], and have been updated if relevant.    Review of Systems  Constitutional: Positive for fatigue. Negative for fever and unexpected weight change.  Gastrointestinal: Positive for nausea. Negative for constipation, diarrhea, rectal pain and vomiting.  Musculoskeletal: Negative.   Skin: Negative.   Hematological: Negative.   Psychiatric/Behavioral: Negative.   All other systems reviewed and are negative.      Objective:    BP 110/66 (BP Location: Left Arm, Patient Position: Sitting, Cuff Size: Normal)   Pulse 75   Temp 98 F (36.7 C) (Oral)   Ht 5\' 2"  (1.575 m)   Wt 138 lb 3.2 oz (62.7 kg)   SpO2 99%   BMI 25.28 kg/m   Wt Readings from Last 3 Encounters:  03/19/17 138 lb 3.2 oz (62.7 kg)  02/07/17 137 lb 3.2 oz (62.2 kg)  11/22/16 138 lb 9.6 oz (62.9 kg)    Physical Exam   General:  Well-developed,well-nourished,in no acute distress; alert,appropriate and cooperative throughout examination Head:  normocephalic and atraumatic.   Eyes:  vision grossly intact, PERRL Ears:  R ear normal and L ear normal externally, TMs clear bilaterally Nose:  no external deformity.   Mouth:  good dentition.   Neck:  No deformities, masses, or tenderness noted. Lungs:  Normal respiratory effort, chest expands symmetrically. Lungs are clear to auscultation, no crackles or wheezes. Heart:  Normal rate and regular rhythm. S1 and S2 normal without gallop, murmur, click, rub or other extra sounds. Abdomen:  Bowel sounds positive,abdomen soft and non-tender without masses, organomegaly or hernias noted. Msk:  No deformity or scoliosis noted of thoracic or lumbar spine.   Extremities:  No clubbing, cyanosis, edema, or deformity noted with normal full range of motion of all joints.   Neurologic:  alert & oriented X3 and gait normal.   Skin:  Intact  without suspicious lesions or rashes Psych:  Cognition and judgment appear intact. Alert and cooperative with normal attention span and concentration. No apparent delusions, illusions, hallucinations       Assessment & Plan:   Nausea - Plan: Ambulatory referral to Gastroenterology  Bacterial infection due to H. pylori - Plan: Ambulatory referral to Gastroenterology No Follow-up on file.

## 2017-03-19 NOTE — Patient Instructions (Signed)
Great to see you. Please stop by to see Monica on your way out. 

## 2017-03-19 NOTE — Assessment & Plan Note (Signed)
Persistent even after finishing treatment for H pylori. Will refer back to GI for further work up. The patient indicates understanding of these issues and agrees with the plan.

## 2017-03-20 ENCOUNTER — Telehealth: Payer: Self-pay | Admitting: Physician Assistant

## 2017-03-20 NOTE — Telephone Encounter (Signed)
The pt has been scheduled to see Dr Rhea BeltonPyrtle on 03/27/17 as requested by the PCP for history of H pylori.  She was treated but has continued to have nausea .

## 2017-03-20 NOTE — Telephone Encounter (Signed)
Patient being referred for nausea and Hpylori, but wanting to speak with nurse before she schedules ov. Pt was last seen 9.26.18 by Victorino DikeJennifer.

## 2017-03-27 ENCOUNTER — Encounter: Payer: Self-pay | Admitting: *Deleted

## 2017-03-27 ENCOUNTER — Ambulatory Visit: Payer: BLUE CROSS/BLUE SHIELD | Admitting: Internal Medicine

## 2017-03-27 VITALS — BP 92/70 | HR 73 | Ht 62.0 in | Wt 137.0 lb

## 2017-03-27 DIAGNOSIS — Z8619 Personal history of other infectious and parasitic diseases: Secondary | ICD-10-CM

## 2017-03-27 DIAGNOSIS — K5909 Other constipation: Secondary | ICD-10-CM

## 2017-03-27 DIAGNOSIS — R1013 Epigastric pain: Secondary | ICD-10-CM

## 2017-03-27 MED ORDER — LINACLOTIDE 72 MCG PO CAPS: 72.0000 ug | ORAL_CAPSULE | Freq: Every day | ORAL | 2 refills | Status: DC

## 2017-03-27 NOTE — Progress Notes (Signed)
Subjective:    Patient ID: [name], female    DOB: 1965-10-12, 52 y.o.   MRN: [id number]  HPI [name] is a 52 yo female with PMH of chronic constipation, gallstones status post cholecystectomy, migraines, plantar fasciitis who is seen in follow-up.  She was initially seen on 11/22/2016 by Hyacinth Meeker, PA-C to evaluate epigastric abdominal pain, heartburn and constipation.  At that time she was given omeprazole 40 mg daily and MiraLAX for her constipation.  She reports she took the omeprazole for a month and her heartburn seemed to improve but she still had epigastric abdominal pain and nausea.  She went back to primary care and an H. pylori antibody was positive and she was treated with triple therapy (azithromycin, clarithromycin and twice daily PPI).  She reports that her epigastric abdominal pain seemed to get a little better with antibiotics but persists.  She is still having daily epigastric pain which does not necessarily relate to eating.  She does feel full quickly.  She has nausea without vomiting.  No dysphagia or odynophagia.  She uses an over-the-counter medication for migraines but she is unsure if this contains NSAID or not. Epigastric pain does not radiate.  MiraLAX does improve the chronic constipation however she has a difficult time taking this medication.  She states she is taking it so long that she dislikes the taste and also with her work schedule it is difficult for her to remain compliant/adherent.  No blood in her stool or melena.  No family history of colon cancer.  Of note she has lost 30-40 pounds which she thinks is related to Topamax which she uses for migraines.  She is also using tramadol about 1 pill per night for plantar fasciitis.   Review of Systems As per HPI, otherwise negative  Current Medications, Allergies, Past Medical History, Past Surgical History, Family History and Social History were reviewed in [employer]  record.     Objective:   Physical Exam BP 92/70   Pulse 73   Ht 5\' 2"  (1.575 m)   Wt 137 lb (62.1 kg)   BMI 25.06 kg/m  Constitutional: Well-developed and well-nourished. No distress. HEENT: Normocephalic and atraumatic. Oropharynx is clear and moist. Conjunctivae are normal.  No scleral icterus. Neck: Neck supple. Trachea midline. Cardiovascular: Normal rate, regular rhythm and intact distal pulses. No M/R/G Pulmonary/chest: Effort normal and breath sounds normal. No wheezing, rales or rhonchi. Abdominal: Soft, nontender, nondistended. Bowel sounds active throughout. There are no masses palpable. No hepatosplenomegaly. Extremities: no clubbing, cyanosis, or edema Neurological: Alert and oriented to person place and time. Skin: Skin is warm and dry. Psychiatric: Normal mood and affect. Behavior is normal.  CBC    Component Value Date/Time   WBC 8.2 02/07/2017 1338   RBC 4.43 02/07/2017 1338   HGB 14.3 02/07/2017 1338   HCT 43.6 02/07/2017 1338   PLT 232.0 02/07/2017 1338   MCV 98.4 02/07/2017 1338   MCV 90.2 12/20/2015 1230   MCH 32.2 10/16/2016 0930   MCHC 32.7 02/07/2017 1338   RDW 13.4 02/07/2017 1338   LYMPHSABS 2.4 02/07/2017 1338   MONOABS 0.6 02/07/2017 1338   EOSABS 0.3 02/07/2017 1338   BASOSABS 0.1 02/07/2017 1338   CMP     Component Value Date/Time   NA 146 (H) 02/07/2017 1338   K 3.9 02/07/2017 1338   CL 110 02/07/2017 1338   CO2 26 02/07/2017 1338   GLUCOSE 91 02/07/2017 1338   BUN  14 02/07/2017 1338   CREATININE 0.68 02/07/2017 1338   CREATININE 0.61 05/27/2015 1900   CALCIUM 10.1 02/07/2017 1338   PROT 6.8 02/07/2017 1338   ALBUMIN 4.3 02/07/2017 1338   AST 13 02/07/2017 1338   ALT 10 02/07/2017 1338   ALKPHOS 52 02/07/2017 1338   BILITOT 0.5 02/07/2017 1338   GFRNONAA >60 10/16/2016 0930   GFRAA >60 10/16/2016 0930       Assessment & Plan:  52 yo female with PMH of chronic constipation, gallstones status post cholecystectomy, migraines,  plantar fasciitis who is seen in follow-up.  1.  Epigastric pain --treated for H. pylori with persistent epigastric abdominal pain.  She has nausea as well.  Heartburn is no longer a major component and she is off PPI.  I recommended upper endoscopy to further evaluate this symptom.  We discussed the risk, benefits and alternatives and she is agreeable to proceed.  2.  Chronic constipation --less than ideal response to MiraLAX and this medication is also difficult for her to take.  Trial of Linzess 72 mcg daily.  May need to dose titrate for response.  3.  CRC screening --colonoscopy recommended after Jul 05, 2017 for average risk screening  25 minutes spent with the patient today. Greater than 50% was spent in counseling and coordination of care with the patient

## 2017-03-27 NOTE — Patient Instructions (Signed)
You have been scheduled for an endoscopy. Please follow written instructions given to you at your visit today. If you use inhalers (even only as needed), please bring them with you on the day of your procedure. Your physician has requested that you go to www.startemmi.com and enter the access code given to you at your visit today. This web site gives a general overview about your procedure. However, you should still follow specific instructions given to you by our office regarding your preparation for the procedure.  We have sent the following medications to your pharmacy for you to pick up at your convenience: Linzess 72 mcg daily  Discontinue Miralax  If you are age 50 or older, your body mass index should be between 23-30. Your Body mass index is 25.06 kg/m. If this is out of the aforementioned range listed, please consider follow up with your Primary Care Provider.  If you are age 364 or younger, your body mass index should be between 19-25. Your Body mass index is 25.06 kg/m. If this is out of the aformentioned range listed, please consider follow up with your Primary Care Provider.

## 2017-03-28 ENCOUNTER — Encounter: Payer: Self-pay | Admitting: Internal Medicine

## 2017-03-29 ENCOUNTER — Telehealth: Payer: Self-pay | Admitting: Internal Medicine

## 2017-03-29 NOTE — Telephone Encounter (Signed)
Dr Rhea BeltonPyrtle, Lorain ChildesFYI, patient has called back stating the medication she is taking PRN is Excedrin extra strength. This has been added to her med list.

## 2017-04-06 ENCOUNTER — Ambulatory Visit (AMBULATORY_SURGERY_CENTER): Payer: BLUE CROSS/BLUE SHIELD | Admitting: Internal Medicine

## 2017-04-06 ENCOUNTER — Other Ambulatory Visit: Payer: Self-pay

## 2017-04-06 ENCOUNTER — Encounter: Payer: Self-pay | Admitting: Internal Medicine

## 2017-04-06 VITALS — BP 112/60 | HR 77 | Temp 97.1°F | Resp 18 | Ht 62.0 in | Wt 137.0 lb

## 2017-04-06 DIAGNOSIS — R1013 Epigastric pain: Secondary | ICD-10-CM

## 2017-04-06 DIAGNOSIS — K2950 Unspecified chronic gastritis without bleeding: Secondary | ICD-10-CM | POA: Diagnosis not present

## 2017-04-06 DIAGNOSIS — K2900 Acute gastritis without bleeding: Secondary | ICD-10-CM

## 2017-04-06 MED ORDER — SODIUM CHLORIDE 0.9 % IV SOLN: 500.0000 mL | Freq: Once | INTRAVENOUS | Status: DC

## 2017-04-06 MED ORDER — PANTOPRAZOLE SODIUM 40 MG PO TBEC: 40.0000 mg | DELAYED_RELEASE_TABLET | Freq: Two times a day (BID) | ORAL | 3 refills | Status: DC

## 2017-04-06 NOTE — Progress Notes (Signed)
Called to room to assist during endoscopic procedure.  Patient ID and intended procedure confirmed with present staff. Received instructions for my participation in the procedure from the performing physician.  

## 2017-04-06 NOTE — Op Note (Signed)
[facility] Patient Name: [name] Procedure Date: 04/06/2017 4:15 PM MRN: [id number] Endoscopist: Beverley Fiedler , MD Age: 52 Referring MD:  Date of Birth: [birth year] Gender: Female Account #: [id number] Procedure:                Upper GI endoscopy Indications:              Epigastric abdominal pain, Nausea Medicines:                Monitored Anesthesia Care Procedure:                Pre-Anesthesia Assessment:                           - Prior to the procedure, a History and Physical                            was performed, and patient medications and                            allergies were reviewed. The patient's tolerance of                            previous anesthesia was also reviewed. The risks                            and benefits of the procedure and the sedation                            options and risks were discussed with the patient.                            All questions were answered, and informed consent                            was obtained. Prior Anticoagulants: The patient has                            taken no previous anticoagulant or antiplatelet                            agents. ASA Grade Assessment: II - A patient with                            mild systemic disease. After reviewing the risks                            and benefits, the patient was deemed in                            satisfactory condition to undergo the procedure.                           After obtaining informed consent, the endoscope was  passed under direct vision. Throughout the                            procedure, the patient's blood pressure, pulse, and                            oxygen saturations were monitored continuously. The                            Endoscope was introduced through the mouth, and                            advanced to the second part of duodenum. The upper                            GI endoscopy was  accomplished without difficulty.                            The patient tolerated the procedure well. Scope In: Scope Out: Findings:                 A non-obstructing Schatzki ring (acquired) was                            found at the gastroesophageal junction.                           A 2 cm hiatal hernia was present.                           Normal mucosa was found in the entire esophagus.                           Diffuse moderate inflammation characterized by                            congestion (edema), erosions and erythema was found                            in the gastric body and in the gastric antrum.                            Biopsies were taken with a cold forceps for                            histology and Helicobacter pylori testing.                           Few non-bleeding superficial duodenal ulcers with                            no stigmata of bleeding and moderate bulbar                            duodenitis were found  in the duodenal bulb.                           The second portion of the duodenum was normal. Complications:            No immediate complications. Estimated Blood Loss:     Estimated blood loss was minimal. Impression:               - Normal mucosa was found in the entire esophagus.                           - 2 cm hiatal hernia.                           - Gastritis. Biopsied.                           - Duodenal ulcers with duodenitis.                           - Normal second portion of the duodenum. Recommendation:           - Patient has a contact number available for                            emergencies. The signs and symptoms of potential                            delayed complications were discussed with the                            patient. Return to normal activities tomorrow.                            Written discharge instructions were provided to the                            patient.                           - Resume  previous diet.                           - Continue present medications.                           - Avoid NSAIDs (including Excedrin)                           - Begin pantoprazole 40 mg twice daily before 1st                            and last meal of the day.                           - Await pathology results. Beverley FiedlerJay M Wendelin Reader, MD 04/06/2017 4:39:34 PM This report has been signed electronically.

## 2017-04-06 NOTE — Progress Notes (Signed)
Report to PACU, RN, vss, BBS= Clear.  

## 2017-04-06 NOTE — Patient Instructions (Signed)
Impression/Recommendations:  Hiatal hernia handout given to patient. Gastritis handout given to patient.  Resume previous diet. Continue present medications. Avoid NSAIDs (including Excedrin).   No aspirin, Ibuprofen, Naproxen.  Tylenol only.  Begin  Pantoprazole 40 mg. One tablet Twice daily, before 1st and last meal of the day.  Await pathology results.  YOU HAD AN ENDOSCOPIC PROCEDURE TODAY AT THE [facility]:   Refer to the procedure report that was given to you for any specific questions about what was found during the examination.  If the procedure report does not answer your questions, please call your gastroenterologist to clarify.  If you requested that your care partner not be given the details of your procedure findings, then the procedure report has been included in a sealed envelope for you to review at your convenience later.  YOU SHOULD EXPECT: Some feelings of bloating in the abdomen. Passage of more gas than usual.  Walking can help get rid of the air that was put into your GI tract during the procedure and reduce the bloating. If you had a lower endoscopy (such as a colonoscopy or flexible sigmoidoscopy) you may notice spotting of blood in your stool or on the toilet paper. If you underwent a bowel prep for your procedure, you may not have a normal bowel movement for a few days.  Please Note:  You might notice some irritation and congestion in your nose or some drainage.  This is from the oxygen used during your procedure.  There is no need for concern and it should clear up in a day or so.  SYMPTOMS TO REPORT IMMEDIATELY:   Following upper endoscopy (EGD)  Vomiting of blood or coffee ground material  New chest pain or pain under the shoulder blades  Painful or persistently difficult swallowing  New shortness of breath  Fever of 100F or higher  Black, tarry-looking stools  For urgent or emergent issues, a gastroenterologist can be reached at any hour by  calling [phone].   DIET:  We do recommend a small meal at first, but then you may proceed to your regular diet.  Drink plenty of fluids but you should avoid alcoholic beverages for 24 hours.  ACTIVITY:  You should plan to take it easy for the rest of today and you should NOT DRIVE or use heavy machinery until tomorrow (because of the sedation medicines used during the test).    FOLLOW UP: Our staff will call the number listed on your records the next business day following your procedure to check on you and address any questions or concerns that you may have regarding the information given to you following your procedure. If we do not reach you, we will leave a message.  However, if you are feeling well and you are not experiencing any problems, there is no need to return our call.  We will assume that you have returned to your regular daily activities without incident.  If any biopsies were taken you will be contacted by phone or by letter within the next 1-3 weeks.  Please call us at [phone] if you have not heard about the biopsies in 3 weeks.    SIGNATURES/CONFIDENTIALITY: You and/or your care partner have signed paperwork which will be entered into your electronic medical record.  These signatures attest to the fact that that the information above on your After Visit Summary has been reviewed and is understood.  Full responsibility of the confidentiality of this discharge information lies with  you and/or your care-partner.

## 2017-04-09 ENCOUNTER — Telehealth: Payer: Self-pay

## 2017-04-09 NOTE — Telephone Encounter (Signed)
  Follow up Call-  Call back number 04/06/2017  Post procedure Call Back phone  # [phone]  Permission to leave phone message Yes  Some recent data might be hidden     Left message

## 2017-04-09 NOTE — Telephone Encounter (Signed)
Left message

## 2017-04-10 ENCOUNTER — Telehealth: Payer: Self-pay | Admitting: Internal Medicine

## 2017-04-10 NOTE — Telephone Encounter (Signed)
Discussed with pt that her biopsy results are not back yet and she will be notified when they are back. Pt also requested a diet that she should follow for ulcers. Diet placed in the mail for pt.

## 2017-04-13 ENCOUNTER — Encounter: Payer: Self-pay | Admitting: Internal Medicine

## 2017-04-13 ENCOUNTER — Telehealth: Payer: Self-pay | Admitting: Internal Medicine

## 2017-04-13 MED ORDER — ONDANSETRON 4 MG PO TBDP: 4.0000 mg | ORAL_TABLET | Freq: Three times a day (TID) | ORAL | 0 refills | Status: DC | PRN

## 2017-04-13 NOTE — Telephone Encounter (Signed)
Pt aware and script sent to pharmacy. 

## 2017-04-13 NOTE — Telephone Encounter (Signed)
Peptic ulcer disease and inflammation in the stomach and duodenum is most likely cause for nausea Continue PPI and zofran ODT 4 mg every 6-8 hrs can be used PRN nausea

## 2017-04-13 NOTE — Telephone Encounter (Signed)
Spoke with pt and she is aware of results. Pt states she is still having lots of nausea and wants to know what is causing the nausea. Reports she is taking the protonix BID. Please advise.

## 2017-04-24 ENCOUNTER — Ambulatory Visit: Payer: Self-pay | Admitting: Neurology

## 2017-04-26 ENCOUNTER — Telehealth: Payer: Self-pay | Admitting: Internal Medicine

## 2017-04-26 ENCOUNTER — Other Ambulatory Visit (HOSPITAL_COMMUNITY)
Admission: RE | Admit: 2017-04-26 | Discharge: 2017-04-26 | Disposition: A | Discharge status: Home / Self Care | Payer: BLUE CROSS/BLUE SHIELD | Source: Ambulatory Visit | Attending: Family Medicine | Admitting: Family Medicine

## 2017-04-26 ENCOUNTER — Ambulatory Visit: Payer: BLUE CROSS/BLUE SHIELD | Admitting: Family Medicine

## 2017-04-26 ENCOUNTER — Other Ambulatory Visit: Payer: Self-pay

## 2017-04-26 VITALS — BP 102/66 | HR 66 | Temp 98.4°F | Ht 62.0 in | Wt 137.0 lb

## 2017-04-26 DIAGNOSIS — R1013 Epigastric pain: Secondary | ICD-10-CM | POA: Insufficient documentation

## 2017-04-26 DIAGNOSIS — R350 Frequency of micturition: Secondary | ICD-10-CM | POA: Diagnosis not present

## 2017-04-26 DIAGNOSIS — R110 Nausea: Secondary | ICD-10-CM | POA: Diagnosis not present

## 2017-04-26 DIAGNOSIS — Z01419 Encounter for gynecological examination (general) (routine) without abnormal findings: Secondary | ICD-10-CM

## 2017-04-26 DIAGNOSIS — N760 Acute vaginitis: Secondary | ICD-10-CM | POA: Diagnosis not present

## 2017-04-26 LAB — POCT URINALYSIS DIPSTICK
BILIRUBIN UA: NEGATIVE
GLUCOSE UA: NEGATIVE
KETONES UA: NEGATIVE
Leukocytes, UA: NEGATIVE
Nitrite, UA: NEGATIVE
Protein, UA: NEGATIVE
RBC UA: NEGATIVE
Urobilinogen, UA: 0.2 E.U./dL
pH, UA: 6 (ref 5.0–8.0)

## 2017-04-26 MED ORDER — SUCRALFATE 1 GM/10ML PO SUSP: 1.0000 g | Freq: Three times a day (TID) | ORAL | 1 refills | Status: DC

## 2017-04-26 NOTE — Telephone Encounter (Signed)
Spoke with pt and she is aware. Lab orders in epic, script for carafate sent to pharmacy.

## 2017-04-26 NOTE — Patient Instructions (Signed)
Great to see you. Please contact Dr. Rhea BeltonPyrtle to follow up.

## 2017-04-26 NOTE — Telephone Encounter (Signed)
Add carafate 1 g TID-AC and HS Check CBC, CMP, lipase, amylase Continue the BID PPI No NSAIDs

## 2017-04-26 NOTE — Telephone Encounter (Signed)
Patient states she is still having really bad abd pain due to ulcers. Patient wanting to know if she can be prescribed something to help.

## 2017-04-26 NOTE — Telephone Encounter (Signed)
Pt states she is having pain from what she states is related to her ulcers. Wants to know if there is something she can take for the cramping pain. States it is in the higher part of her stomach all across and around to her back. Please advise.

## 2017-04-26 NOTE — Progress Notes (Signed)
Subjective:   Patient ID: [name], female    DOB: 09/27/1965, 52 y.o.   MRN: [id number]  [name] is a pleasant 52 y.o. year old female who presents to clinic today with Abdominal Pain (Patient is here today C/O abd pain. Located epigastric, Dx with ulcers 3-weeks-ago by [facility].  Had to leave work from the pain.  Is asking if there is something that she can take that will calm her stomach and the pain.); Vaginal Itching (Patient also states that she has had vaginal itching since 2.15.19.  Denies any abnormal D/C and has used OTC cream but Sx persist and only helps directly after application.); and Urinary Frequency (Patient is also C/O urinary frequency.  This started on 2.15.19 as well but the dysuria went away on 2.17.19.  She urinates frequently with only small amount of urine each time.  Denies flank pain.)  on 04/26/2017  HPI: Abdominal pain-   Followed by Dr. Rhea BeltonPyrtle. Had EGD on 04/06/17- reviewed- duodenal ulcers, advised Protonix 40 mg twice daily.  Called Dr. Lauro FranklinPyrtle's office on 04/13/17 complaining of nausea.  He advised adding zofran 4 mg every 6 -8 hours as needed and to continue PPI at current dose.  Felt likely coming from inflammation secondary to gastritis and duodenal ulcers. No vomiting. No black or bloody stools.  Now also having vaginal itching since 04/13/17. Denies any abnormal vaginal discharge.  Has used OTC cream without improvement. Also having some increased urinary frequency.  Did have some dysuria but feels this has resolved.  Current Outpatient Medications on File Prior to Visit  Medication Sig Dispense Refill  . linaclotide (LINZESS) 72 MCG capsule Take 1 capsule (72 mcg total) by mouth daily before breakfast. 30 capsule 2  . ondansetron (ZOFRAN-ODT) 4 MG disintegrating tablet Take 1 tablet (4 mg total) by mouth every 8 (eight) hours as needed for nausea or vomiting. 20 tablet 0  . pantoprazole (PROTONIX) 40 MG tablet Take 1 tablet (40 mg  total) by mouth 2 (two) times daily. One tablet before 1st and last meal of the day. 90 tablet 3  . PREMPRO 0.3-1.5 MG tablet Take 1 tablet by mouth daily.    Marland Kitchen. topiramate (TOPAMAX) 100 MG tablet TAKE 2 TABLETS (200 MG TOTAL) BY MOUTH AT BEDTIME 60 tablet 2  . traMADol (ULTRAM) 50 MG tablet Take 1 tablet (50 mg total) by mouth 3 (three) times daily. 90 tablet 2   No current facility-administered medications on file prior to visit.     Allergies  Allergen Reactions  . Bactrim [Sulfamethoxazole-Trimethoprim] Rash and Other (See Comments)    Pt states she developed a rash on arms, throat soreness, headache and ear pain.     Past Medical History:  Diagnosis Date  . Anxiety   . Chronic headaches   . Depression   . Gall stones   . H. pylori infection   . Plantar fasciitis, bilateral     Past Surgical History:  Procedure Laterality Date  . CHOLECYSTECTOMY  06/09/2011   Procedure: LAPAROSCOPIC CHOLECYSTECTOMY;  Surgeon: Liz MaladyBurke E Thompson, MD;  [facility];  Service: General;  Laterality: N/A;  . DILATION AND CURETTAGE OF UTERUS    . ERCP  06/08/2011   Procedure: ENDOSCOPIC RETROGRADE CHOLANGIOPANCREATOGRAPHY (ERCP);  Surgeon: Theda BelfastPatrick D Hung, MD;  [facility];  Service: Endoscopy;  Laterality: N/A;  . TONSILLECTOMY      Family History  Problem Relation Age of Onset  . Diabetes Mother   .  Heart disease Mother        AMI  . Heart failure Father   . Heart disease Father        CHF with defibrillator.  . Hypertension Sister   . Polycystic ovary syndrome Daughter     Social History   Socioeconomic History  . Marital status: Legally Separated    Spouse name: Loraine Leriche  . Number of children: Not on file  . Years of education: 12th  . Highest education level: Not on file  Social Needs  . Financial resource strain: Not on file  . Food insecurity - worry: Not on file  . Food insecurity - inability: Not on file  . Transportation needs - medical: Not on file  .  Transportation needs - non-medical: Not on file  Occupational History  . Occupation: Merchandiser, retail: [employer]  . Occupation: Waitress    Comment: [employer]  Tobacco Use  . Smoking status: Never Smoker  . Smokeless tobacco: Never Used  Substance and Sexual Activity  . Alcohol use: Yes    Comment: occasionally, 1/month  . Drug use: No  . Sexual activity: Yes    Birth control/protection: Injection, None    Comment: no menstruation in "years."  Other Topics Concern  . Not on file  Social History Narrative   Marital status: Married      Children: 3 children      Lives: with daughter and husband      Employment: works at [employer] in [employer]      Tobacco; none      Alcohol:  Socially; weekends.      Drugs: none     Exercise: none   The PMH, PSH, Social History, Family History, Medications, and allergies have been reviewed in [facility], and have been updated if relevant.   Review of Systems  Constitutional: Negative.   Genitourinary: Positive for frequency and urgency. Negative for difficulty urinating, dyspareunia, dysuria, enuresis, genital sores, hematuria, menstrual problem, pelvic pain, vaginal bleeding, vaginal discharge and vaginal pain.       + vaginal itching  All other systems reviewed and are negative.      Objective:    BP 102/66 (BP Location: Left Arm, Patient Position: Sitting, Cuff Size: Normal)   Pulse 66   Temp 98.4 F (36.9 C) (Oral)   Ht 5\' 2"  (1.575 m)   Wt 137 lb (62.1 kg)   SpO2 100%   BMI 25.06 kg/m    Physical Exam  Constitutional: She is oriented to person, place, and time. She appears well-developed and well-nourished. No distress.  HENT:  Head: Normocephalic and atraumatic.  Eyes: Conjunctivae are normal.  Cardiovascular: Normal rate.  Pulmonary/Chest: Effort normal.  Abdominal: Soft. Bowel sounds are normal. Hernia confirmed negative in the right inguinal area and confirmed negative in the left inguinal area.  Genitourinary: Vagina  normal. There is no rash on the right labia. There is no rash on the left labia. No vaginal discharge found.  Musculoskeletal: Normal range of motion.  Lymphadenopathy:       Right: No inguinal adenopathy present.       Left: No inguinal adenopathy present.  Neurological: She is alert and oriented to person, place, and time. No cranial nerve deficit.  Skin: Skin is warm and dry. She is not diaphoretic.  Psychiatric: She has a normal mood and affect. Her behavior is normal. Judgment and thought content normal.  Nursing note and vitals reviewed.  Assessment & Plan:   Nausea  Urinary frequency - Plan: POCT urinalysis dipstick  Acute vaginitis No Follow-up on file.

## 2017-04-26 NOTE — Assessment & Plan Note (Signed)
No abnormalities or abnormal discharge on exam- will send off wet prep and STD screening. She was due for a pap smear, so this was done as well.

## 2017-04-26 NOTE — Assessment & Plan Note (Signed)
Likely related to gastritis and duodenal ulcers. Advised to continue current rx. UA neg- she will follow up with Dr. Rhea BeltonPyrtle.

## 2017-04-27 ENCOUNTER — Other Ambulatory Visit (INDEPENDENT_AMBULATORY_CARE_PROVIDER_SITE_OTHER): Payer: BLUE CROSS/BLUE SHIELD

## 2017-04-27 ENCOUNTER — Other Ambulatory Visit: Payer: Self-pay

## 2017-04-27 ENCOUNTER — Encounter: Payer: Self-pay | Admitting: Neurology

## 2017-04-27 ENCOUNTER — Ambulatory Visit: Payer: BLUE CROSS/BLUE SHIELD | Admitting: Neurology

## 2017-04-27 ENCOUNTER — Telehealth: Payer: Self-pay | Admitting: Neurology

## 2017-04-27 VITALS — BP 106/82 | HR 82 | Ht 62.0 in | Wt 136.0 lb

## 2017-04-27 DIAGNOSIS — G43019 Migraine without aura, intractable, without status migrainosus: Secondary | ICD-10-CM

## 2017-04-27 DIAGNOSIS — R1013 Epigastric pain: Secondary | ICD-10-CM | POA: Diagnosis not present

## 2017-04-27 LAB — CBC WITH DIFFERENTIAL/PLATELET
BASOS ABS: 0.1 10*3/uL (ref 0.0–0.1)
BASOS PCT: 1 % (ref 0.0–3.0)
EOS ABS: 0.4 10*3/uL (ref 0.0–0.7)
Eosinophils Relative: 6.2 % — ABNORMAL HIGH (ref 0.0–5.0)
HEMATOCRIT: 42.8 % (ref 36.0–46.0)
HEMOGLOBIN: 14.7 g/dL (ref 12.0–15.0)
LYMPHS PCT: 40.4 % (ref 12.0–46.0)
Lymphs Abs: 2.6 10*3/uL (ref 0.7–4.0)
MCHC: 34.2 g/dL (ref 30.0–36.0)
MCV: 95.6 fl (ref 78.0–100.0)
MONO ABS: 0.5 10*3/uL (ref 0.1–1.0)
Monocytes Relative: 7.7 % (ref 3.0–12.0)
Neutro Abs: 2.9 10*3/uL (ref 1.4–7.7)
Neutrophils Relative %: 44.7 % (ref 43.0–77.0)
Platelets: 209 10*3/uL (ref 150.0–400.0)
RBC: 4.47 Mil/uL (ref 3.87–5.11)
RDW: 12.9 % (ref 11.5–15.5)
WBC: 6.5 10*3/uL (ref 4.0–10.5)

## 2017-04-27 LAB — CYTOLOGY - PAP
Bacterial vaginitis: NEGATIVE
Candida vaginitis: NEGATIVE
Chlamydia: NEGATIVE
Diagnosis: NEGATIVE
HPV (WINDOPATH): NOT DETECTED
NEISSERIA GONORRHEA: NEGATIVE
TRICH (WINDOWPATH): NEGATIVE

## 2017-04-27 LAB — COMPREHENSIVE METABOLIC PANEL
ALBUMIN: 3.8 g/dL (ref 3.5–5.2)
ALK PHOS: 44 U/L (ref 39–117)
ALT: 11 U/L (ref 0–35)
AST: 13 U/L (ref 0–37)
BUN: 11 mg/dL (ref 6–23)
CALCIUM: 10.2 mg/dL (ref 8.4–10.5)
CHLORIDE: 113 meq/L — AB (ref 96–112)
CO2: 23 mEq/L (ref 19–32)
CREATININE: 0.72 mg/dL (ref 0.40–1.20)
GFR: 91.2 mL/min (ref >60.00)
Glucose, Bld: 94 mg/dL (ref 70–99)
Potassium: 4 mEq/L (ref 3.5–5.1)
SODIUM: 143 meq/L (ref 135–145)
Total Bilirubin: 0.4 mg/dL (ref 0.2–1.2)
Total Protein: 6.8 g/dL (ref 6.0–8.3)

## 2017-04-27 LAB — LIPASE: LIPASE: 27 U/L (ref 11.0–59.0)

## 2017-04-27 LAB — AMYLASE: AMYLASE: 49 U/L (ref 27–131)

## 2017-04-27 MED ORDER — SUMATRIPTAN SUCCINATE 6 MG/0.5ML ~~LOC~~ SOAJ: SUBCUTANEOUS | 5 refills | Status: DC

## 2017-04-27 MED ORDER — TOPIRAMATE 100 MG PO TABS: 200.0000 mg | ORAL_TABLET | Freq: Every day | ORAL | 1 refills | Status: DC

## 2017-04-27 NOTE — Telephone Encounter (Signed)
Yes, she told me before she left and it has been sent in to the pharmacy.

## 2017-04-27 NOTE — Patient Instructions (Signed)
1.  Take sumatriptan 6mg  injection earliest onset of migraine.  May repeat dose once after 1 hour if needed (not to exceed 2 shots in 24 hours) 2.  Continue topiramate 200mg  at bedtime 3.  Follow up in 6 months.

## 2017-04-27 NOTE — Telephone Encounter (Signed)
Patient said she also needs her Topiramate also refilled. Thanks

## 2017-04-27 NOTE — Progress Notes (Signed)
NEUROLOGY FOLLOW UP OFFICE NOTE  [name] [id number]  HISTORY OF PRESENT ILLNESS: [name] is a 52 year old right-handed female with OSA and plantar fasciitis who follows up for migraine.   UPDATE: [name] has not been seen since July. Intensity:  Moderate Duration:  3 days Frequency:  1 to 2 times a month Current NSAIDS:  No (contraindicated due to stomach ulcers) Current analgesics:  Excedrin Migraine; Tramadol (at night for plantar fasciitis) Current triptans:  None (she was unable to afford sumatriptan 20mg  NS) Current anti-emetic:  Zofran ODT 4mg  Current muscle relaxants:  no Current anti-anxiolytic:  no Current sleep aide:  no Current Antihypertensive medications:  no Current Antidepressant medications:  no Current Anticonvulsant medications:  topiramate 200mg  Current Vitamins/Herbal/Supplements:  no Other therapy:  no   Caffeine:  32 oz tea daily Alcohol:  seldom Smoker:  no Diet:  Does not drink enough water.  No soda. Exercise:  no Depression yes; Anxiety: No:  Her father passed away last year due to illness. Sleep hygiene:  She was referred for evaluation of OSA.  Sleep study revealed mild OSA but no significant central sleep apnea.  She was prescribed CPAP but still endorses daytime somnolence.   HISTORY: Onset:  "many years" Location:  Across forehead and left sided Quality:  Pressure/pounding Initial Intensity:  10/10; April: 10/10 Initial duration:  constant Initial frequency:  constant Aura:  no Prodrome:  no Associated symptoms:  Phonophobia, sometimes nausea.  No photophobia or vomiting. Usually occurs in clusters separated by headache-free periods of several months.  Current flare-up started over 2 months ago and has been constant and daily.  Last headache was 6 months ago. Triggers/exacerbating factors:  Loud noise, stress Relieving factors:  no Activity:  Able to function   Past NSAIDS:  ibuprofen, meloxicam,  naproxen Past analgesics:  Goodys, Norco, Excedrin, Tylenol, Fioricet Past abortive triptans:  Sumatriptan tablet, Maxalt.  Zomig 2.5mg  ineffective (5mg  effective but caused eye pressure) Relpax was too expensive. Past muscle relaxants:  Flexeril Past anti-emetic:  Zofran Past antihypertensive medications:  no Past antidepressant medications:  Prozac, venlafaxine XR 37.5mg , maybe nortriptyline many years ago for depression, Cymbalta (side effects)  Past anticonvulsant medications:  Gabapentin (for plantar fasciitis) Past vitamins/Herbal/Supplements:  no Other past therapies:  no   Family history of headache:  daughter   CT of head from 04/02/13 to evaluate headache was personally reviewed and revealed cerebellar tonsillar ectopia but no significant crowding at the foramen magnum.  PAST MEDICAL HISTORY: Past Medical History:  Diagnosis Date  . Anxiety   . Chronic headaches   . Depression   . Gall stones   . H. pylori infection   . Plantar fasciitis, bilateral     MEDICATIONS: Current Outpatient Medications on File Prior to Visit  Medication Sig Dispense Refill  . linaclotide (LINZESS) 72 MCG capsule Take 1 capsule (72 mcg total) by mouth daily before breakfast. 30 capsule 2  . ondansetron (ZOFRAN-ODT) 4 MG disintegrating tablet Take 1 tablet (4 mg total) by mouth every 8 (eight) hours as needed for nausea or vomiting. 20 tablet 0  . pantoprazole (PROTONIX) 40 MG tablet Take 1 tablet (40 mg total) by mouth 2 (two) times daily. One tablet before 1st and last meal of the day. 90 tablet 3  . PREMPRO 0.3-1.5 MG tablet Take 1 tablet by mouth daily.    . sucralfate (CARAFATE) 1 GM/10ML suspension Take 10 mLs (1 g total) by mouth 4 (four) times daily -  with meals and at bedtime. 420 mL 1  . traMADol (ULTRAM) 50 MG tablet Take 1 tablet (50 mg total) by mouth 3 (three) times daily. 90 tablet 2   No current facility-administered medications on file prior to visit.     ALLERGIES: Allergies   Allergen Reactions  . Bactrim [Sulfamethoxazole-Trimethoprim] Rash and Other (See Comments)    Pt states she developed a rash on arms, throat soreness, headache and ear pain.     FAMILY HISTORY: Family History  Problem Relation Age of Onset  . Diabetes Mother   . Heart disease Mother        AMI  . Heart failure Father   . Heart disease Father        CHF with defibrillator.  . Hypertension Sister   . Polycystic ovary syndrome Daughter     SOCIAL HISTORY: Social History   Socioeconomic History  . Marital status: Legally Separated    Spouse name: Loraine Leriche  . Number of children: Not on file  . Years of education: 12th  . Highest education level: Not on file  Social Needs  . Financial resource strain: Not on file  . Food insecurity - worry: Not on file  . Food insecurity - inability: Not on file  . Transportation needs - medical: Not on file  . Transportation needs - non-medical: Not on file  Occupational History  . Occupation: Merchandiser, retail: [employer]  . Occupation: Waitress    Comment: [employer]  Tobacco Use  . Smoking status: Never Smoker  . Smokeless tobacco: Never Used  Substance and Sexual Activity  . Alcohol use: Yes    Comment: occasionally, 1/month  . Drug use: No  . Sexual activity: Yes    Birth control/protection: Injection, None    Comment: no menstruation in "years."  Other Topics Concern  . Not on file  Social History Narrative   Marital status: Married      Children: 3 children      Lives: with daughter and husband      Employment: works at [employer] in [place]      Tobacco; none      Alcohol:  Socially; weekends.      Drugs: none     Exercise: none    REVIEW OF SYSTEMS: Constitutional: No fevers, chills, or sweats, no generalized fatigue, change in appetite Eyes: No visual changes, double vision, eye pain Ear, nose and throat: No hearing loss, ear pain, nasal congestion, sore throat Cardiovascular: No chest pain,  palpitations Respiratory:  No shortness of breath at rest or with exertion, wheezes GastrointestinaI: No nausea, vomiting, diarrhea, abdominal pain, fecal incontinence Genitourinary:  No dysuria, urinary retention or frequency Musculoskeletal:  No neck pain, back pain Integumentary: No rash, pruritus, skin lesions Neurological: as above Psychiatric: No depression, insomnia, anxiety Endocrine: No palpitations, fatigue, diaphoresis, mood swings, change in appetite, change in weight, increased thirst Hematologic/Lymphatic:  No purpura, petechiae. Allergic/Immunologic: no itchy/runny eyes, nasal congestion, recent allergic reactions, rashes  PHYSICAL EXAM: Vitals:   04/27/17 1433  BP: 106/82  Pulse: 82  SpO2: 99%   General: No acute distress.  Patient appears well-groomed.   Head:  Normocephalic/atraumatic Eyes:  Fundi examined but not visualized Neck: supple, no paraspinal tenderness, full range of motion Heart:  Regular rate and rhythm Lungs:  Clear to auscultation bilaterally Back: No paraspinal tenderness Neurological Exam: alert and oriented to person, place, and time. Attention span and concentration intact, recent and remote memory intact, fund of knowledge  intact.  Speech fluent and not dysarthric, language intact.  CN II-XII intact. Bulk and tone normal, muscle strength 5/5 throughout.  Sensation to light touch  intact.  Deep tendon reflexes 2+ throughout.  Finger to nose testing intact.  Gait normal, Romberg negative.  IMPRESSION: Intractable migraine without aura  PLAN: 1.  Continue topiramate 200mg  at bedtime 2.  For abortive therapy, we will try sumatriptan 6mg  [place] (hopefully it will be affordable) 3.  Exercise, hydration,sleep hygiene 4.  Limit pain relievers to no more than 2 days out of week 5.  Headache diary 6. Follow up in 6 months.  Shon Millet, DO  CC:  Ruthe Mannan, MD

## 2017-04-30 ENCOUNTER — Telehealth: Payer: Self-pay | Admitting: Family Medicine

## 2017-04-30 NOTE — Telephone Encounter (Signed)
Copied from [phone] [id number]. Topic: Quick Communication - See Telephone Encounter >> Apr 30, 2017  2:45 PM Terisa Starr wrote: CRM for notification. See Telephone encounter for:   04/30/17.  Patient is requesting labs from Thursday. Please call at [phone]

## 2017-05-01 NOTE — Telephone Encounter (Signed)
Patient aware of results.

## 2017-05-02 ENCOUNTER — Telehealth: Payer: Self-pay | Admitting: Internal Medicine

## 2017-05-02 ENCOUNTER — Telehealth: Payer: Self-pay

## 2017-05-02 DIAGNOSIS — R300 Dysuria: Secondary | ICD-10-CM

## 2017-05-02 MED ORDER — METOCLOPRAMIDE HCL 5 MG PO TABS: 5.0000 mg | ORAL_TABLET | Freq: Four times a day (QID) | ORAL | 1 refills | Status: DC

## 2017-05-02 NOTE — Telephone Encounter (Signed)
Spoke with pt and she is aware, script sent to pharmacy. 

## 2017-05-02 NOTE — Telephone Encounter (Signed)
Can offer short-term treatment with reglan for nausea reglan 5 mg TIDPRN nausea,additional dose can be used at bedtime PRN This may be able to totally replace zofran Would not take this for longer than 4-6 weeks

## 2017-05-02 NOTE — Telephone Encounter (Signed)
Discussed with pt that her lab results were normal. Pt states she is having issues with nausea after she eats. States she is taking carafate 4x/day, protonix 2x/day, and zofran 4-8mg  every 8 hours as needed for the nausea. Pt asking if there is anything else Dr. Rhea BeltonPyrtle could recommend. Please advise.

## 2017-05-02 NOTE — Telephone Encounter (Signed)
Patient wanting lab results from Friday 3.1.19 and states that she has being feeling nauseous all the time again. Pt requesting a call to discuss but needs it after 2:30 due to work.

## 2017-05-02 NOTE — Telephone Encounter (Signed)
Plz do POCT Urinalysis and send out C&S/orders entered/pt aware/please advise me as soon as in house U/A completed/thx dmf

## 2017-05-02 NOTE — Telephone Encounter (Signed)
-----   Message from Dianne Dunalia M Aron, MD sent at 05/01/2017  3:32 PM EST ----- Would she be able to drop off another urine sample?

## 2017-05-04 LAB — CERVICOVAGINAL ANCILLARY ONLY: Herpes: NEGATIVE

## 2017-05-08 ENCOUNTER — Telehealth: Payer: Self-pay

## 2017-05-08 DIAGNOSIS — F3340 Major depressive disorder, recurrent, in remission, unspecified: Secondary | ICD-10-CM | POA: Diagnosis not present

## 2017-05-08 DIAGNOSIS — F4310 Post-traumatic stress disorder, unspecified: Secondary | ICD-10-CM | POA: Diagnosis not present

## 2017-05-08 DIAGNOSIS — F411 Generalized anxiety disorder: Secondary | ICD-10-CM | POA: Diagnosis not present

## 2017-05-08 NOTE — Telephone Encounter (Signed)
PA for sumatriptan 6 MG/0.5 ML inj PA was initiated by 05/08/2017 through [email or web address] KEY # is [id number]

## 2017-05-11 NOTE — Telephone Encounter (Signed)
Sumatriptan Succinate 6 mg/05 ML approved

## 2017-05-15 ENCOUNTER — Encounter: Payer: Self-pay | Admitting: Pulmonary Disease

## 2017-05-15 ENCOUNTER — Telehealth: Payer: Self-pay | Admitting: Family Medicine

## 2017-05-15 ENCOUNTER — Ambulatory Visit: Payer: BLUE CROSS/BLUE SHIELD | Admitting: Pulmonary Disease

## 2017-05-15 DIAGNOSIS — G4710 Hypersomnia, unspecified: Secondary | ICD-10-CM

## 2017-05-15 DIAGNOSIS — G4733 Obstructive sleep apnea (adult) (pediatric): Secondary | ICD-10-CM | POA: Diagnosis not present

## 2017-05-15 MED ORDER — BUPROPION HCL ER (XL) 150 MG PO TB24: 150.0000 mg | ORAL_TABLET | Freq: Every day | ORAL | 2 refills | Status: DC

## 2017-05-15 NOTE — Telephone Encounter (Signed)
TA-pt agrees/I have sent to pharmacy/thx dmf

## 2017-05-15 NOTE — Telephone Encounter (Signed)
TA-Plz see pt call about Sleep Apnea Dr discussing about a medication for depression/Here is the information from his visit note/If you would like to start her on a medication plz advise and I will be happy to call her and send in/thx dmf  Persistent in spite of CPAP use, differential includes depression and idiopathic hypersomnolence, doubt narcolepsy Discussed with PCP about antidepressant such as Wellbutrin.  Continue counseling at least once a month  Sunlight exposure 30 minutes every day.  If sleepiness persists, then contact us to schedule nap study to check for narcolepsy

## 2017-05-15 NOTE — Assessment & Plan Note (Signed)
Persistent in spite of CPAP use, differential includes depression and idiopathic hypersomnolence, doubt narcolepsy Discussed with PCP about antidepressant such as Wellbutrin.  Continue counseling at least once a month  Sunlight exposure 30 minutes every day.  If sleepiness persists, then contact us to schedule nap study to check for narcolepsy

## 2017-05-15 NOTE — Telephone Encounter (Signed)
Copied from CRM [phone]. Topic: Quick Communication - See Telephone Encounter >> May 15, 2017  4:01 PM Terisa Starraylor, Brittany L wrote: CRM for notification. See Telephone encounter for:   05/15/17.  Pt said she saw her sleep apnea dr today and the dr was going to send a note to Dr Dayton MartesAron to get her on some depression medicine. She said to please call her at [phone]

## 2017-05-15 NOTE — Progress Notes (Signed)
   Subjective:    Patient ID: [name], female    DOB: 09/17/1965, 52 y.o.   MRN: [id number]  HPI  52 year old female followed for mild obstructive sleep apnea  She was diagnosed with mild OSA and started on CPAP therapy which she has been using for the last 10 months.  She feels that CPAP is really helping her pneumonia on her nights that she is using it. CPAP download shows excellent control of events on auto CPAP 5-10 cm with average pressure of 9 cm compliance variable but at times up to 6-8 hours  with several most nights.  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 On days where she does not have to work her second job she sometimes naps from 230 to 7 PM and then is again able to fall asleep again.  She reports lifelong depression starting after her first childbirth at age 52, was undergoing counseling but could not afford this.  Symptoms worsened after the death of her father a year ago.  Does not like medications, tearful during the interview  Medications reviewed she takes tramadol as needed for fasciitis and Reglan as needed for nausea Has lost about 30 pounds since her diagnosis  Significant tests/ events reviewed  NPSG  04/2016 AHI 12 /hr , SaO2 low 82%.  CPAP titration study 07/20/16 CPAP optimal pressure 5cmH2o   Past Medical History:  Diagnosis Date  . Anxiety   . Chronic headaches   . Depression   . Gall stones   . H. pylori infection   . Plantar fasciitis, bilateral      Review of Systems neg for any significant sore throat, dysphagia, itching, sneezing, nasal congestion or excess/ purulent secretions, fever, chills, sweats, unintended wt loss, pleuritic or exertional cp, hempoptysis, orthopnea pnd or change in chronic leg swelling. Also denies presyncope, palpitations, heartburn, abdominal pain, nausea, vomiting, diarrhea or change  in bowel or urinary habits, dysuria,hematuria, rash, arthralgias, visual complaints, headache, numbness weakness or ataxia.     Objective:   Physical Exam  Gen. Pleasant, well-nourished, in no distress, depressed affect ENT - no thrush, no post nasal drip Neck: No JVD, no thyromegaly, no carotid bruits Lungs: no use of accessory muscles, no dullness to percussion, clear without rales or rhonchi  Cardiovascular: Rhythm regular, heart sounds  normal, no murmurs or gallops, no peripheral edema Musculoskeletal: No deformities, no cyanosis or clubbing         Assessment & Plan:

## 2017-05-15 NOTE — Assessment & Plan Note (Signed)
Continue CPAP for now We will sleepiness has not been improved in spite of usage and correction of events

## 2017-05-15 NOTE — Telephone Encounter (Signed)
Yes if she is interested, I am more than happy to send in Wellbutrin 150 mg XL every morning.

## 2017-05-15 NOTE — Patient Instructions (Addendum)
Continue on CPAP for now. Discuss with PCP about antidepressant such as Wellbutrin.  Continue counseling at least once a month  Sunlight exposure 30 minutes every day.  If sleepiness persists, then contact us to schedule nap study to check for narcolepsy

## 2017-05-16 NOTE — Telephone Encounter (Signed)
Thank you :)

## 2017-05-25 ENCOUNTER — Ambulatory Visit: Payer: Self-pay | Admitting: Pulmonary Disease

## 2017-06-11 ENCOUNTER — Ambulatory Visit: Payer: BLUE CROSS/BLUE SHIELD | Admitting: Family Medicine

## 2017-06-11 ENCOUNTER — Encounter: Payer: Self-pay | Admitting: Family Medicine

## 2017-06-11 VITALS — BP 96/68 | HR 82 | Temp 97.9°F | Ht 62.0 in | Wt 133.6 lb

## 2017-06-11 DIAGNOSIS — R109 Unspecified abdominal pain: Secondary | ICD-10-CM | POA: Diagnosis not present

## 2017-06-11 LAB — POCT URINALYSIS DIPSTICK
BILIRUBIN UA: NEGATIVE
GLUCOSE UA: NEGATIVE
Ketones, UA: NEGATIVE
Leukocytes, UA: NEGATIVE
Nitrite, UA: NEGATIVE
PH UA: 6 (ref 5.0–8.0)
Protein, UA: NEGATIVE
Spec Grav, UA: >=1.03 — AB (ref 1.010–1.025)
UROBILINOGEN UA: 0.2 U/dL

## 2017-06-11 NOTE — Progress Notes (Signed)
SUBJECTIVE: [name] is a 52 y.o. female who complains of urinary frequency, urgency, intermittent flank pain and dysuria without fever, chills, or abnormal vaginal discharge or bleeding.   Current Outpatient Medications on File Prior to Visit  Medication Sig Dispense Refill  . buPROPion (WELLBUTRIN XL) 150 MG 24 hr tablet Take 1 tablet (150 mg total) by mouth daily. 30 tablet 2  . linaclotide (LINZESS) 72 MCG capsule Take 1 capsule (72 mcg total) by mouth daily before breakfast. 30 capsule 2  . metoCLOPramide (REGLAN) 5 MG tablet Take 1 tablet (5 mg total) by mouth 4 (four) times daily. 120 tablet 1  . ondansetron (ZOFRAN-ODT) 4 MG disintegrating tablet Take 1 tablet (4 mg total) by mouth every 8 (eight) hours as needed for nausea or vomiting. 20 tablet 0  . pantoprazole (PROTONIX) 40 MG tablet Take 1 tablet (40 mg total) by mouth 2 (two) times daily. One tablet before 1st and last meal of the day. 90 tablet 3  . PREMPRO 0.3-1.5 MG tablet Take 1 tablet by mouth daily.    . sucralfate (CARAFATE) 1 GM/10ML suspension Take 10 mLs (1 g total) by mouth 4 (four) times daily -  with meals and at bedtime. 420 mL 1  . topiramate (TOPAMAX) 100 MG tablet Take 2 tablets (200 mg total) by mouth at bedtime. 180 tablet 1  . traMADol (ULTRAM) 50 MG tablet Take 1 tablet (50 mg total) by mouth 3 (three) times daily. 90 tablet 2   No current facility-administered medications on file prior to visit.     Allergies  Allergen Reactions  . Bactrim [Sulfamethoxazole-Trimethoprim] Rash and Other (See Comments)    Pt states she developed a rash on arms, throat soreness, headache and ear pain.     Past Medical History:  Diagnosis Date  . Anxiety   . Chronic headaches   . Depression   . Gall stones   . H. pylori infection   . Plantar fasciitis, bilateral     Past Surgical History:  Procedure Laterality Date  . CHOLECYSTECTOMY  06/09/2011   Procedure: LAPAROSCOPIC CHOLECYSTECTOMY;  Surgeon: Liz MaladyBurke E  Thompson, MD;  [facility];  Service: General;  Laterality: N/A;  . DILATION AND CURETTAGE OF UTERUS    . ERCP  06/08/2011   Procedure: ENDOSCOPIC RETROGRADE CHOLANGIOPANCREATOGRAPHY (ERCP);  Surgeon: Theda BelfastPatrick D Hung, MD;  [facility];  Service: Endoscopy;  Laterality: N/A;  . TONSILLECTOMY      Family History  Problem Relation Age of Onset  . Diabetes Mother   . Heart disease Mother        AMI  . Heart failure Father   . Heart disease Father        CHF with defibrillator.  . Hypertension Sister   . Polycystic ovary syndrome Daughter     Social History   Socioeconomic History  . Marital status: Legally Separated    Spouse name: [name]  . Number of children: Not on file  . Years of education: 12th  . Highest education level: Not on file  Occupational History  . Occupation: Merchandiser, retailCook/Attendant    Employer: [employer]  . Occupation: Waitress    Comment: [employer]  Social Needs  . Financial resource strain: Not on file  . Food insecurity:    Worry: Not on file    Inability: Not on file  . Transportation needs:    Medical: Not on file    Non-medical: Not on file  Tobacco Use  . Smoking status: Never  Smoker  . Smokeless tobacco: Never Used  Substance and Sexual Activity  . Alcohol use: Yes    Comment: occasionally, 1/month  . Drug use: No  . Sexual activity: Yes    Birth control/protection: Injection, None    Comment: no menstruation in "years."  Lifestyle  . Physical activity:    Days per week: Not on file    Minutes per session: Not on file  . Stress: Not on file  Relationships  . Social connections:    Talks on phone: Not on file    Gets together: Not on file    Attends religious service: Not on file    Active member of club or organization: Not on file    Attends meetings of clubs or organizations: Not on file    Relationship status: Not on file  . Intimate partner violence:    Fear of current or ex partner: Not on file    Emotionally abused: Not on file     Physically abused: Not on file    Forced sexual activity: Not on file  Other Topics Concern  . Not on file  Social History Narrative   Marital status: Married      Children: 3 children      Lives: with daughter and husband      Employment: works at [employer] in [place]      Tobacco; none      Alcohol:  Socially; weekends.      Drugs: none     Exercise: none   The PMH, PSH, Social History, Family History, Medications, and allergies have been reviewed in [facility], and have been updated if relevant.  OBJECTIVE:  There were no vitals taken for this visit.  Appears well, in no apparent distress.  Vital signs are normal. The abdomen is soft without tenderness, guarding, mass, rebound or organomegaly. No CVA tenderness or inguinal adenopathy noted. Urine dipstick shows positive for RBC's.    ASSESSMENT: Intermittent UTI like symptoms  PLAN: UA pos for RBCs only. ? Bladder spams- discussed diet. Send urine for cx to rule out infection. The patient indicates understanding of these issues and agrees with the plan.

## 2017-06-11 NOTE — Patient Instructions (Signed)
Great to see you.  We will call you with your urine culture.  Drink a lot of water.

## 2017-06-12 LAB — URINE CULTURE
MICRO NUMBER:: 90460392
Result:: NO GROWTH
SPECIMEN QUALITY:: ADEQUATE

## 2017-06-14 ENCOUNTER — Encounter: Payer: Self-pay | Admitting: Internal Medicine

## 2017-06-25 ENCOUNTER — Emergency Department (HOSPITAL_COMMUNITY)
Admission: EM | Admit: 2017-06-25 | Discharge: 2017-06-25 | Discharge status: Against Medical Advice | Payer: BLUE CROSS/BLUE SHIELD | Source: Home / Self Care

## 2017-06-25 ENCOUNTER — Other Ambulatory Visit: Payer: Self-pay

## 2017-06-25 ENCOUNTER — Encounter (HOSPITAL_COMMUNITY): Payer: Self-pay

## 2017-06-25 ENCOUNTER — Emergency Department (HOSPITAL_COMMUNITY)
Admission: EM | Admit: 2017-06-25 | Discharge: 2017-06-25 | Disposition: A | Discharge status: Home / Self Care | Payer: BLUE CROSS/BLUE SHIELD | Attending: Emergency Medicine | Admitting: Emergency Medicine

## 2017-06-25 DIAGNOSIS — Z79899 Other long term (current) drug therapy: Secondary | ICD-10-CM | POA: Insufficient documentation

## 2017-06-25 DIAGNOSIS — G43809 Other migraine, not intractable, without status migrainosus: Secondary | ICD-10-CM | POA: Diagnosis not present

## 2017-06-25 DIAGNOSIS — R51 Headache: Secondary | ICD-10-CM | POA: Diagnosis not present

## 2017-06-25 HISTORY — DX: Migraine, unspecified, not intractable, without status migrainosus: G43.909

## 2017-06-25 MED ORDER — KETOROLAC TROMETHAMINE 30 MG/ML IJ SOLN
30.0000 mg | Freq: Once | INTRAMUSCULAR | Status: AC
  Administered 2017-06-25: 30 mg via INTRAVENOUS
  Filled 2017-06-25: qty 1

## 2017-06-25 MED ORDER — PROCHLORPERAZINE EDISYLATE 10 MG/2ML IJ SOLN
10.0000 mg | Freq: Once | INTRAMUSCULAR | Status: AC
  Administered 2017-06-25: 10 mg via INTRAVENOUS
  Filled 2017-06-25: qty 2

## 2017-06-25 MED ORDER — ONDANSETRON 4 MG PO TBDP
4.0000 mg | ORAL_TABLET | Freq: Once | ORAL | Status: AC
  Administered 2017-06-25: 4 mg via ORAL
  Filled 2017-06-25: qty 1

## 2017-06-25 MED ORDER — SODIUM CHLORIDE 0.9 % IV BOLUS
1000.0000 mL | Freq: Once | INTRAVENOUS | Status: AC
  Administered 2017-06-25: 1000 mL via INTRAVENOUS

## 2017-06-25 NOTE — ED Provider Notes (Signed)
[facility] EMERGENCY DEPARTMENT Provider Note   CSN: [id number] Arrival date & time: 06/25/17  1544     History   Chief Complaint Chief Complaint  Patient presents with  . Migraine    HPI [name] is a 52 y.o. female.  HPI  The patient is a 52 year old female, she has a known history of chronic headaches, she is on preventative Topamax as well as a history of being on Imitrex as an abortive medication.  She has had a headache that has been going on for 3 weeks, fluctuating in intensity, she has been very functional through this time and is been able to go to work including today where she was at work.  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  She has been able to walk and denies any stiff neck, vomiting, photophobia or numbness or weakness.  This is similar to prior headaches, it is located diffusely and feels like an intense pressure which seems to fluctuate in intensity throughout the day.  The patient avoids anti-inflammatory medications because of a history of stomach ulcers though she does not have any active stomach ulcer problems or bleeding.  Currently her headache is moderate  Past Medical History:  Diagnosis Date  . Anxiety   . Chronic headaches   . Depression   . Gall stones   . H. pylori infection   . Migraines   . Plantar fasciitis, bilateral     Patient Active Problem List   Diagnosis Date Noted  . Vaginitis 04/26/2017  . Bacterial infection due to H. pylori 03/19/2017  . Nausea 02/07/2017  . Vaginal dryness 02/07/2017  . OSA (obstructive sleep apnea) 05/29/2016  . Hypersomnia 04/06/2016  . Lumbar degenerative disc disease 05/07/2015  . Other mixed anxiety disorders 08/28/2014  . S/P laparoscopic cholecystectomy 07/05/2011  . BREAST PAIN 05/19/2009  . CERVICAL STRAIN 05/19/2009  . ALLERGIC RHINITIS 06/22/2008  . Migraine 06/22/2008  . DEPRESSION, MAJOR, MODERATE 06/15/2008  . PARESTHESIA  06/15/2008  . PULMONARY NODULE 04/17/2007  . COUGH 04/17/2007  . MENORRHAGIA 07/02/2006  . NOCTURIA 04/26/2006    Past Surgical History:  Procedure Laterality Date  . CHOLECYSTECTOMY  06/09/2011   Procedure: LAPAROSCOPIC CHOLECYSTECTOMY;  Surgeon: Liz Malady, MD;  Location: [facility];  Service: General;  Laterality: N/A;  . DILATION AND CURETTAGE OF UTERUS    . ERCP  06/08/2011   Procedure: ENDOSCOPIC RETROGRADE CHOLANGIOPANCREATOGRAPHY (ERCP);  Surgeon: Theda Belfast, MD;  Location: [facility] ENDOSCOPY;  Service: Endoscopy;  Laterality: N/A;  . TONSILLECTOMY       OB History   None      Home Medications    Prior to Admission medications   Medication Sig Start Date End Date Taking? Authorizing Provider  buPROPion (WELLBUTRIN XL) 150 MG 24 hr tablet Take 1 tablet (150 mg total) by mouth daily. 05/15/17   Dianne Dun, MD  linaclotide [facility]) 72 MCG capsule Take 1 capsule (72 mcg total) by mouth daily before breakfast. 03/27/17   Pyrtle, Carie Caddy, MD  metoCLOPramide (REGLAN) 5 MG tablet Take 1 tablet (5 mg total) by mouth 4 (four) times daily. 05/02/17   Pyrtle, Carie Caddy, MD  pantoprazole (PROTONIX) 40 MG tablet Take 1 tablet (40 mg total) by mouth 2 (two) times daily. One tablet before 1st and last meal of the day. 04/06/17   Pyrtle, Carie Caddy, MD  sucralfate (CARAFATE) 1 GM/10ML suspension Take 10 mLs (1 g total)  by mouth 4 (four) times daily -  with meals and at bedtime. 04/26/17   Pyrtle, Carie Caddy, MD  topiramate (TOPAMAX) 100 MG tablet Take 2 tablets (200 mg total) by mouth at bedtime. 04/27/17   Drema Dallas, DO  traMADol (ULTRAM) 50 MG tablet Take 1 tablet (50 mg total) by mouth 3 (three) times daily. 02/02/17   Lenn Sink, DPM    Family History Family History  Problem Relation Age of Onset  . Diabetes Mother   . Heart disease Mother        AMI  . Heart failure Father   . Heart disease Father        CHF with defibrillator.  . Hypertension Sister   . Polycystic ovary syndrome Daughter       Social History Social History   Tobacco Use  . Smoking status: Never Smoker  . Smokeless tobacco: Never Used  Substance Use Topics  . Alcohol use: Yes    Comment: occasionally, 1/month  . Drug use: No     Allergies   Bactrim [sulfamethoxazole-trimethoprim]   Review of Systems Review of Systems  All other systems reviewed and are negative.    Physical Exam Updated Vital Signs BP 132/85 (BP Location: Right Arm)   Pulse 86   Temp 98 F (36.7 C) (Oral)   Resp 16   Ht  (1.575 m)   Wt 60.3 kg (133 lb)   SpO2 100%   BMI 24.33 kg/m   Physical Exam  Constitutional: She appears well-developed and well-nourished. No distress.  HENT:  Head: Normocephalic and atraumatic.  Mouth/Throat: Oropharynx is clear and moist. No oropharyngeal exudate.  Eyes: Pupils are equal, round, and reactive to light. Conjunctivae and EOM are normal. Right eye exhibits no discharge. Left eye exhibits no discharge. No scleral icterus.  Neck: Normal range of motion. Neck supple. No JVD present. No thyromegaly present.  Cardiovascular: Normal rate, regular rhythm, normal heart sounds and intact distal pulses. Exam reveals no gallop and no friction rub.  No murmur heard. Pulmonary/Chest: Effort normal and breath sounds normal. No respiratory distress. She has no wheezes. She has no rales.  Abdominal: Soft. Bowel sounds are normal. She exhibits no distension and no mass. There is no tenderness.  Musculoskeletal: Normal range of motion. She exhibits no edema or tenderness.  Lymphadenopathy:    She has no cervical adenopathy.  Neurological: She is alert. Coordination normal.  Speech is clear, cranial nerves III through XII are intact, memory is intact, strength is normal in all 4 extremities including grips, sensation is intact to light touch and pinprick in all 4 extremities. Coordination as tested by finger-nose-finger is normal, no limb ataxia. Normal gait, normal reflexes at the patellar  tendons bilaterally  Skin: Skin is warm and dry. No rash noted. No erythema.  Psychiatric: She has a normal mood and affect. Her behavior is normal.  Nursing note and vitals reviewed.    ED Treatments / Results  Labs (all labs ordered are listed, but only abnormal results are displayed) Labs Reviewed - No data to display  Radiology No results found.  Procedures Procedures (including critical care time)  Medications Ordered in ED Medications  ondansetron (ZOFRAN-ODT) disintegrating tablet 4 mg (4 mg Oral Given 06/25/17 1629)  ketorolac (TORADOL) 30 MG/ML injection 30 mg (30 mg Intravenous Given 06/25/17 2106)  prochlorperazine (COMPAZINE) injection 10 mg (10 mg Intravenous Given 06/25/17 2104)  sodium chloride 0.9 % bolus 1,000 mL (1,000 mLs Intravenous New Bag/Given  06/25/17 2105)     Initial Impression / Assessment and Plan / ED Course  I have reviewed the triage vital signs and the nursing notes.  Pertinent labs & imaging results that were available during my care of the patient were reviewed by me and considered in my medical decision making (see chart for details).    Patient is a very supple neck, she has normal neurologic findings including her visual acuity, cranial nerves and her coordination and strength in all 4 extremities.  She does not appear to be in distress however she has an ongoing headache and would likely benefit from some intervention with medications.  We will give IV fluids, Compazine, single dose of Toradol as I do not think a single dose will do any damage with her history of stomach ulcers which are not active.  The patient is agreeable to the plan, I do not think that advanced neuro imaging is needed at this time  Medical records reviewed, she had her last CT scan February 4 of 2015 which showed no acute findings and no causes of her chronic headaches  She was reevaluated at 10:45 PM and states that her headache is essentially gone, she feels comfortable  going home and has a sober ride.  She is aware of the indications for return, she has breakthrough medications as needed and has follow-up with neurology.  Final Clinical Impressions(s) / ED Diagnoses   Final diagnoses:  Other migraine without status migrainosus, not intractable    ED Discharge Orders    None       Eber Hong, MD 06/25/17 2250

## 2017-06-25 NOTE — ED Notes (Signed)
Migraine for about one month, with sensitivity to noises, denies any photophobia.

## 2017-06-25 NOTE — ED Triage Notes (Signed)
Pt endorses migraine x 3 weeks and has been taking home meds without relief. No neuro sx. VSS.

## 2017-06-25 NOTE — Discharge Instructions (Signed)
Please take your medicines exactly as prescribed.   ER for worsening headache, vomiting, fevers or stiff neck or any numbness or weakness.

## 2017-07-09 ENCOUNTER — Ambulatory Visit: Payer: BLUE CROSS/BLUE SHIELD | Admitting: Family Medicine

## 2017-07-09 ENCOUNTER — Encounter: Payer: Self-pay | Admitting: Family Medicine

## 2017-07-09 VITALS — BP 118/80 | HR 88 | Temp 98.6°F | Ht 62.0 in | Wt 137.6 lb

## 2017-07-09 DIAGNOSIS — N644 Mastodynia: Secondary | ICD-10-CM | POA: Insufficient documentation

## 2017-07-09 NOTE — Patient Instructions (Signed)
Great to see you. Please call the breast center at [phone] to schedule your mammogram.  

## 2017-07-09 NOTE — Assessment & Plan Note (Signed)
No abnormality noted on exam. Overdue for mammogram so will order bilateral diagnostic mammogram along with right breast US for further evaluation. The patient indicates understanding of these issues and agrees with the plan.  Orders Placed This Encounter  Procedures  . MM Digital Diagnostic Bilat  . US BREAST COMPLETE UNI RIGHT INC AXILLA

## 2017-07-09 NOTE — Progress Notes (Signed)
Subjective:   Patient ID: [name], female    DOB: 1965-10-14, 52 y.o.   MRN: [id number]  [name] is a pleasant 52 y.o. year old female who presents to clinic today with Breast Pain (pain in R breast started hurting 11 days ago, has had pain in her R arm for last 4 days, its a dull pain that radiates from shoulder down toward elbow.)  on 07/09/2017  HPI:  Right breast pain- started 11 days ago.  No known injury but now pain has intermittently been radiating down her right arm and shoulder for the past 4 days so she thinks maybe she pulled a muscle without knowing.  Last mammogram was 02/2010.  No known family history of breast cancer.  Pain is the worse when she first takes her bra off at the end of the day.  She has not felt any masses.  Pain comes and goes. Dull, ache.  No changes in her nipple or discharge.   Current Outpatient Medications on File Prior to Visit  Medication Sig Dispense Refill  . buPROPion (WELLBUTRIN XL) 150 MG 24 hr tablet Take 1 tablet (150 mg total) by mouth daily. 30 tablet 2  . linaclotide (LINZESS) 72 MCG capsule Take 1 capsule (72 mcg total) by mouth daily before breakfast. 30 capsule 2  . metoCLOPramide (REGLAN) 5 MG tablet Take 1 tablet (5 mg total) by mouth 4 (four) times daily. 120 tablet 1  . pantoprazole (PROTONIX) 40 MG tablet Take 1 tablet (40 mg total) by mouth 2 (two) times daily. One tablet before 1st and last meal of the day. 90 tablet 3  . sucralfate (CARAFATE) 1 GM/10ML suspension Take 10 mLs (1 g total) by mouth 4 (four) times daily -  with meals and at bedtime. 420 mL 1  . SUMAtriptan 6 MG/0.5ML SOAJ   5  . topiramate (TOPAMAX) 100 MG tablet Take 2 tablets (200 mg total) by mouth at bedtime. 180 tablet 1  . traMADol (ULTRAM) 50 MG tablet Take 1 tablet (50 mg total) by mouth 3 (three) times daily. 90 tablet 2   No current facility-administered medications on file prior to visit.     Allergies  Allergen Reactions  .  Bactrim [Sulfamethoxazole-Trimethoprim] Rash and Other (See Comments)    Pt states she developed a rash on arms, throat soreness, headache and ear pain.     Past Medical History:  Diagnosis Date  . Anxiety   . Chronic headaches   . Depression   . Gall stones   . H. pylori infection   . Migraines   . Plantar fasciitis, bilateral     Past Surgical History:  Procedure Laterality Date  . CHOLECYSTECTOMY  06/09/2011   Procedure: LAPAROSCOPIC CHOLECYSTECTOMY;  Surgeon: Liz Malady, MD;  Location: [facility] OR;  Service: General;  Laterality: N/A;  . DILATION AND CURETTAGE OF UTERUS    . ERCP  06/08/2011   Procedure: ENDOSCOPIC RETROGRADE CHOLANGIOPANCREATOGRAPHY (ERCP);  Surgeon: Theda Belfast, MD;  Location: [facility] ENDOSCOPY;  Service: Endoscopy;  Laterality: N/A;  . TONSILLECTOMY      Family History  Problem Relation Age of Onset  . Diabetes Mother   . Heart disease Mother        AMI  . Heart failure Father   . Heart disease Father        CHF with defibrillator.  . Hypertension Sister   . Polycystic ovary syndrome Daughter     Social History   Socioeconomic  History  . Marital status: Legally Separated    Spouse name: Loraine Leriche  . Number of children: Not on file  . Years of education: 12th  . Highest education level: Not on file  Occupational History  . Occupation: Merchandiser, retail: [employer]  . Occupation: Waitress    Comment: [employer]  Social Needs  . Financial resource strain: Not on file  . Food insecurity:    Worry: Not on file    Inability: Not on file  . Transportation needs:    Medical: Not on file    Non-medical: Not on file  Tobacco Use  . Smoking status: Never Smoker  . Smokeless tobacco: Never Used  Substance and Sexual Activity  . Alcohol use: Yes    Comment: occasionally, 1/month  . Drug use: No  . Sexual activity: Yes    Birth control/protection: Injection, None    Comment: no menstruation in "years."  Lifestyle  . Physical activity:    Days per  week: Not on file    Minutes per session: Not on file  . Stress: Not on file  Relationships  . Social connections:    Talks on phone: Not on file    Gets together: Not on file    Attends religious service: Not on file    Active member of club or organization: Not on file    Attends meetings of clubs or organizations: Not on file    Relationship status: Not on file  . Intimate partner violence:    Fear of current or ex partner: Not on file    Emotionally abused: Not on file    Physically abused: Not on file    Forced sexual activity: Not on file  Other Topics Concern  . Not on file  Social History Narrative   Marital status: Married      Children: 3 children      Lives: with daughter and husband      Employment: works at [employer] in [place]      Tobacco; none      Alcohol:  Socially; weekends.      Drugs: none     Exercise: none   The PMH, PSH, Social History, Family History, Medications, and allergies have been reviewed in [facility], and have been updated if relevant.  Review of Systems  Constitutional: Negative.   Skin: Negative.   Hematological: Negative.   All other systems reviewed and are negative.      Objective:    BP 118/80 (BP Location: Left Arm, Patient Position: Sitting, Cuff Size: Normal)   Pulse 88   Temp 98.6 F (37 C) (Oral)   Ht  (1.575 m)   Wt 137 lb 9.6 oz (62.4 kg)   SpO2 97%   BMI 25.17 kg/m    Physical Exam  Constitutional: She is oriented to person, place, and time. She appears well-developed and well-nourished. No distress.  HENT:  Head: Normocephalic and atraumatic.  Eyes: EOM are normal.  Cardiovascular: Normal rate.  Pulmonary/Chest: Effort normal. Right breast exhibits tenderness. Right breast exhibits no inverted nipple, no mass, no nipple discharge and no skin change. Left breast exhibits no inverted nipple, no mass, no nipple discharge, no skin change and no tenderness. No breast swelling, tenderness, discharge or bleeding.  Breasts are symmetrical.  Musculoskeletal: Normal range of motion.  Lymphadenopathy:    She has no axillary adenopathy.  Neurological: She is alert and oriented to person, place, and time. No cranial nerve deficit.  Skin: Skin is warm and dry. She is not diaphoretic.  Psychiatric: She has a normal mood and affect. Her behavior is normal. Judgment and thought content normal.  Nursing note and vitals reviewed.         Assessment & Plan:   Breast pain, right No follow-ups on file.

## 2017-07-16 ENCOUNTER — Ambulatory Visit
Admission: RE | Admit: 2017-07-16 | Discharge: 2017-07-16 | Disposition: A | Discharge status: Home / Self Care | Payer: BLUE CROSS/BLUE SHIELD | Source: Ambulatory Visit | Attending: Family Medicine | Admitting: Family Medicine

## 2017-07-16 ENCOUNTER — Ambulatory Visit: Payer: Self-pay

## 2017-07-16 DIAGNOSIS — N644 Mastodynia: Secondary | ICD-10-CM

## 2017-07-16 DIAGNOSIS — R928 Other abnormal and inconclusive findings on diagnostic imaging of breast: Secondary | ICD-10-CM | POA: Diagnosis not present

## 2017-08-01 ENCOUNTER — Other Ambulatory Visit: Payer: Self-pay | Admitting: Internal Medicine

## 2017-08-17 ENCOUNTER — Ambulatory Visit: Payer: Self-pay | Admitting: Physician Assistant

## 2017-08-24 IMAGING — CR DG LUMBAR SPINE COMPLETE 4+V
5 series · 5 of 5 positions shown · non-contrast
Comparison: None.

CLINICAL DATA: Midline low back pain without sciatica.

EXAM:
LUMBAR SPINE - COMPLETE 4+ VIEW

[AP (1 of 2)]
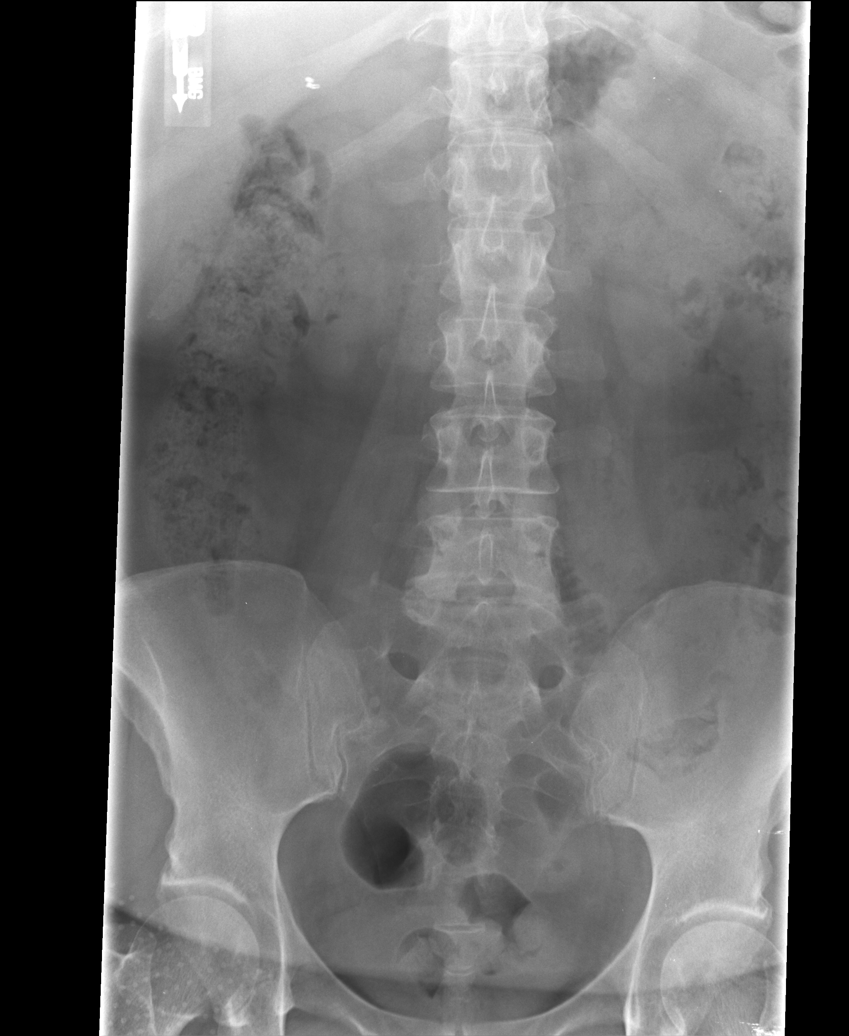

[AP (2 of 2)]
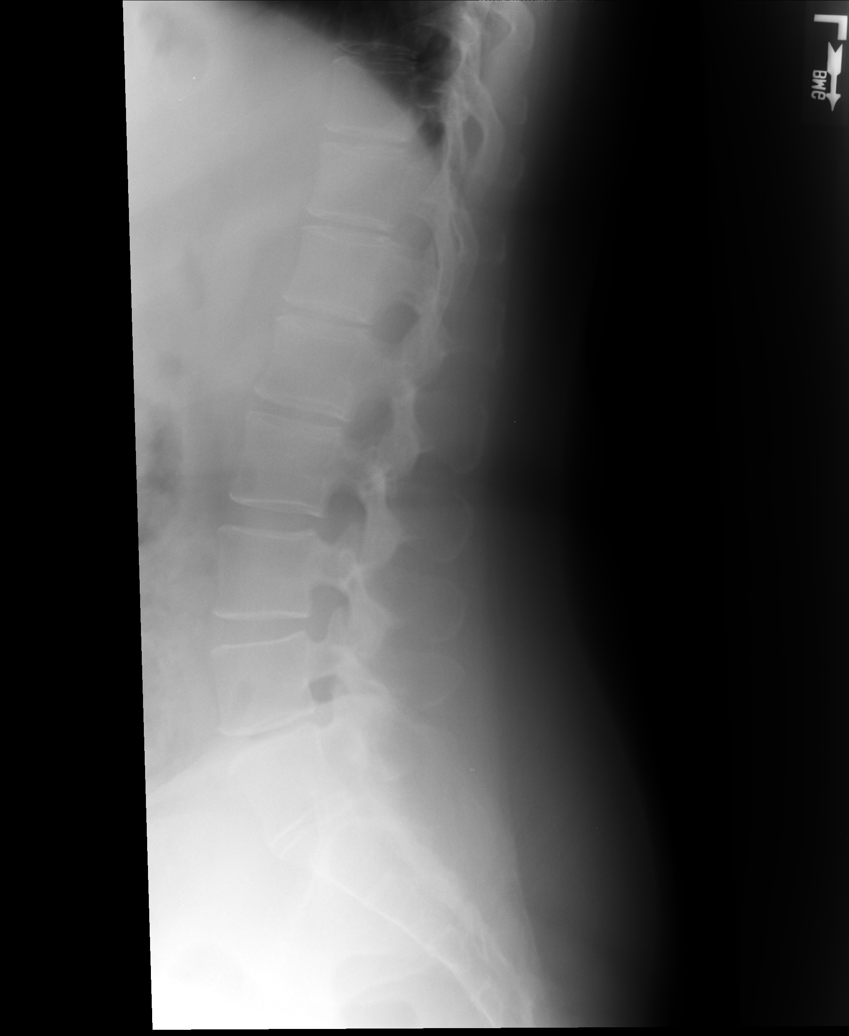

[l5 s1]
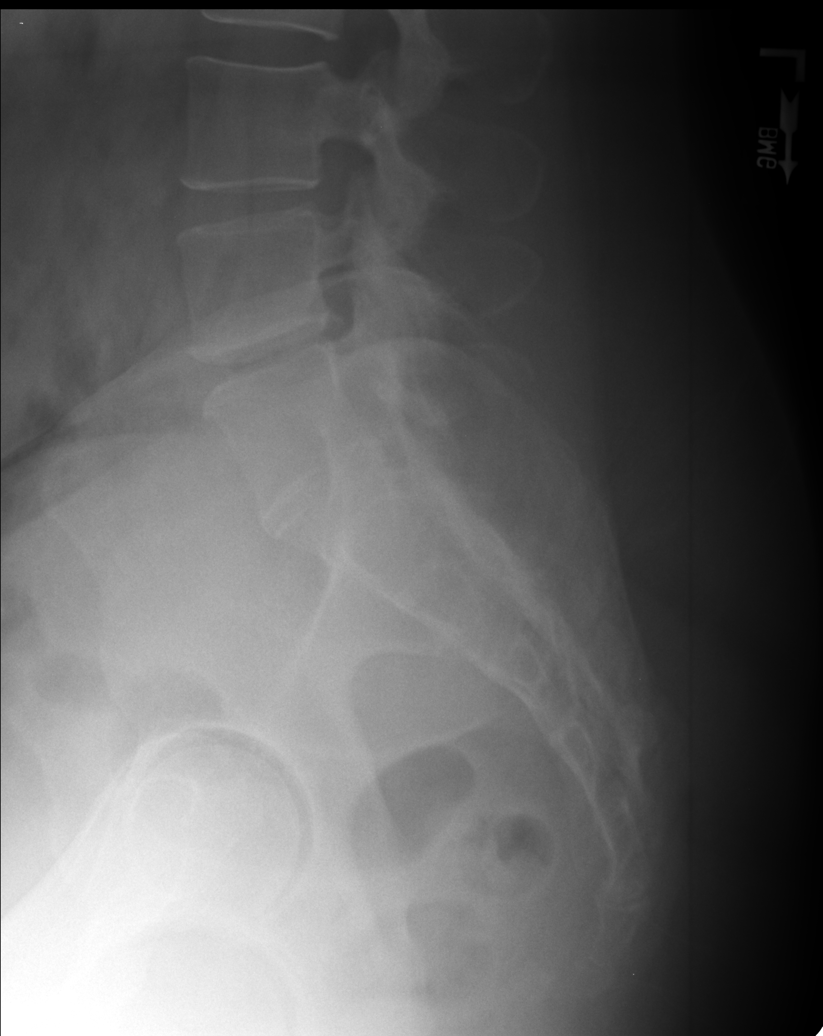

[rpo]
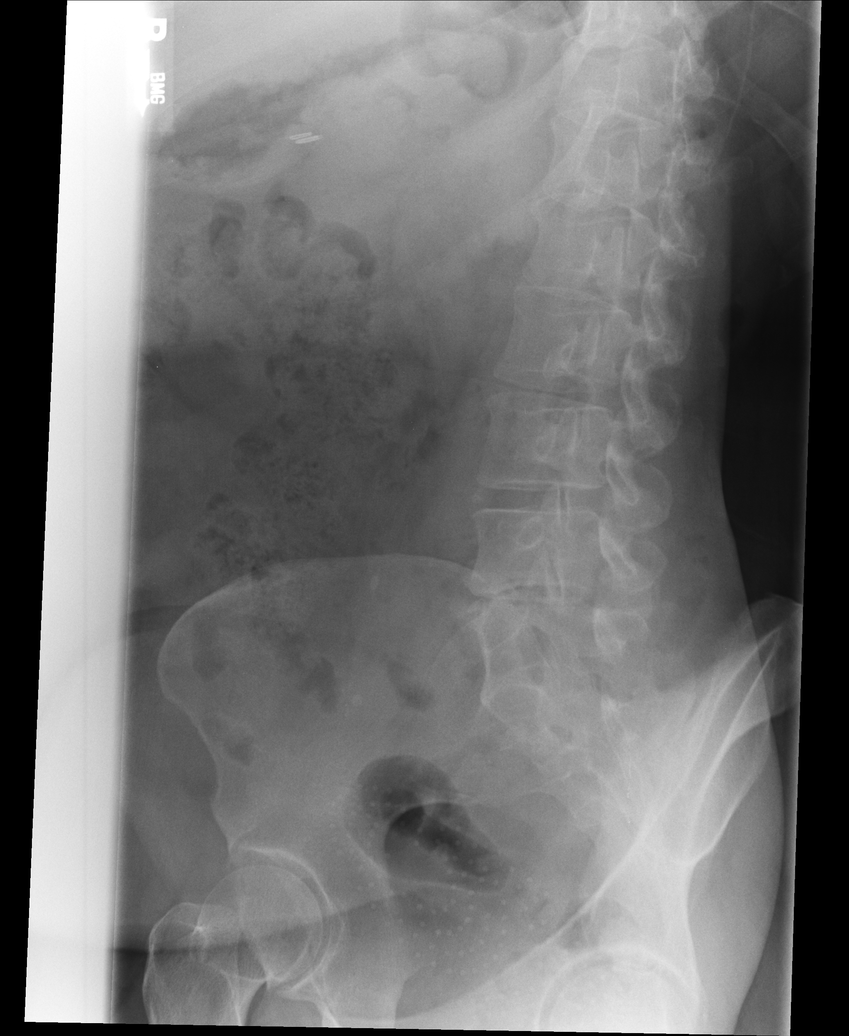

[lpo]
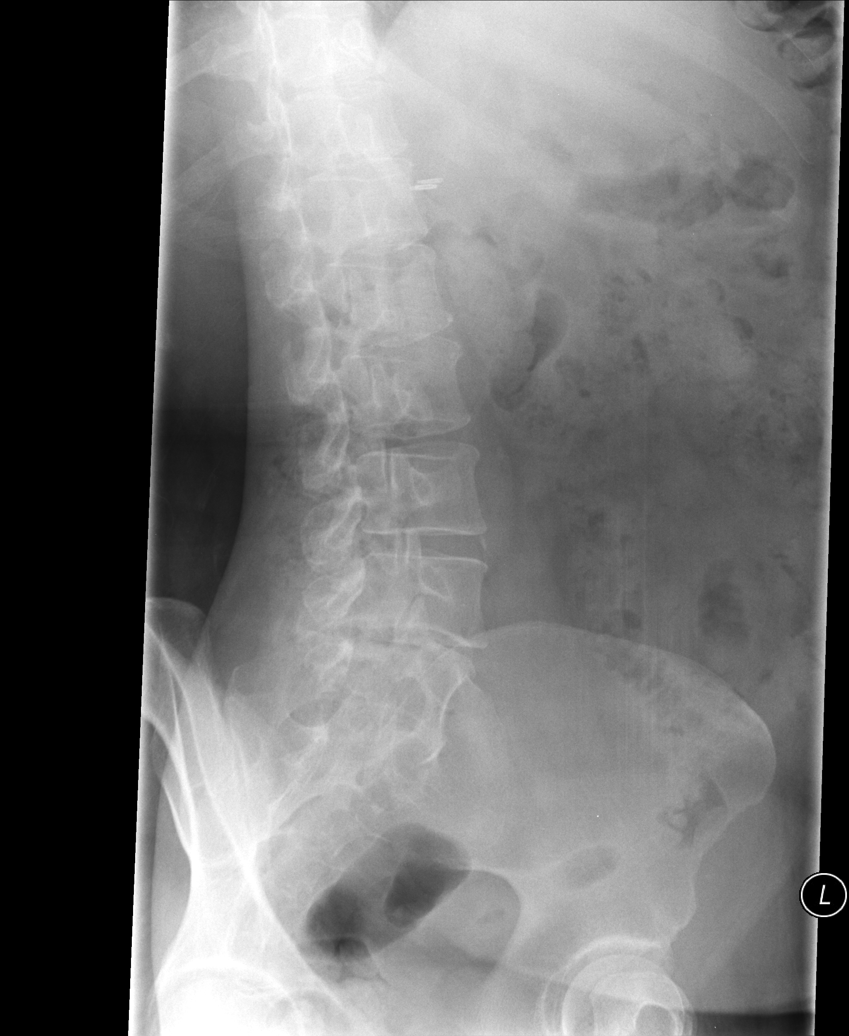

[5 of 5 positions shown; findings below may reference images not displayed]

FINDINGS: No fracture or spondylolisthesis is noted. Moderate degenerative
disc disease is noted at L4-5. Remaining disc spaces appear intact.
Posterior facet joints appear intact.
IMPRESSION: Moderate degenerative disc disease is noted at L4-5. No acute
abnormality seen in the lumbar spine.

## 2017-08-27 ENCOUNTER — Encounter (INDEPENDENT_AMBULATORY_CARE_PROVIDER_SITE_OTHER): Payer: Self-pay

## 2017-08-27 ENCOUNTER — Ambulatory Visit: Payer: BLUE CROSS/BLUE SHIELD | Admitting: Physician Assistant

## 2017-08-27 ENCOUNTER — Encounter: Payer: Self-pay | Admitting: Physician Assistant

## 2017-08-27 VITALS — BP 108/60 | HR 52 | Ht 62.0 in | Wt 138.0 lb

## 2017-08-27 DIAGNOSIS — Z1212 Encounter for screening for malignant neoplasm of rectum: Secondary | ICD-10-CM | POA: Diagnosis not present

## 2017-08-27 DIAGNOSIS — K5909 Other constipation: Secondary | ICD-10-CM

## 2017-08-27 DIAGNOSIS — Z1211 Encounter for screening for malignant neoplasm of colon: Secondary | ICD-10-CM | POA: Diagnosis not present

## 2017-08-27 MED ORDER — LINACLOTIDE 72 MCG PO CAPS: 72.0000 ug | ORAL_CAPSULE | Freq: Every day | ORAL | 11 refills | Status: DC

## 2017-08-27 NOTE — Patient Instructions (Addendum)
It has been recommended to you by your physician that you have a colonoscopy completed. Per your request, we did not schedule the procedure today. Please contact our office at [phone] should you decide to have the procedure completed.  We have sent the following medications to your pharmacy for you to pick up at your convenience:  Linzess 72 mcg daily

## 2017-08-27 NOTE — Progress Notes (Addendum)
Chief Complaint: Chronic constipation  HPI:    [name] is a 52 year old Caucasian female, known to Dr. Rhea BeltonPyrtle, with a history of chronic constipation, gallstones status post cholecystectomy, migraines and plantar fasciitis who presents to clinic today for follow-up of her chronic constipation.    03/27/2017 seen by Dr. Rhea BeltonPyrtle for epigastric pain, heartburn and constipation.  That time recommended EGD, also started Linzess 72 mcg daily as patient had less than ideal response to MiraLAX.  Colonoscopy was recommended after May 9 for colon cancer screening.    EGD 04/06/2017 showed normal mucosa was found in the entire esophagus, 2 cm hiatal hernia, gastritis and duodenal ulcers with duodenitis.  Patient was restarted on Pantoprazole 40 mg twice daily.    Today, explains that she is doing very well.  She is taking her Linzess 72 mcg daily and has a bowel movement "pretty much every day".  Describes being very comfortable.  She would like a refill of this medicine.    Denies fever, chills, blood in her bowel movements, abdominal pain or symptoms that awaken her at night.     Past Medical History:  Diagnosis Date  . Anxiety   . Chronic headaches   . Depression   . Gall stones   . H. pylori infection   . Migraines   . Plantar fasciitis, bilateral     Past Surgical History:  Procedure Laterality Date  . CHOLECYSTECTOMY  06/09/2011   Procedure: LAPAROSCOPIC CHOLECYSTECTOMY;  Surgeon: Liz MaladyBurke E Thompson, MD;  [facility];  Service: General;  Laterality: N/A;  . DILATION AND CURETTAGE OF UTERUS    . ERCP  06/08/2011   Procedure: ENDOSCOPIC RETROGRADE CHOLANGIOPANCREATOGRAPHY (ERCP);  Surgeon: Theda BelfastPatrick D Hung, MD;  [facility];  Service: Endoscopy;  Laterality: N/A;  . TONSILLECTOMY      Current Outpatient Medications  Medication Sig Dispense Refill  . linaclotide (LINZESS) 72 MCG capsule Take 1 capsule (72 mcg total) by mouth daily before breakfast. 30 capsule 11  .  metoCLOPramide (REGLAN) 5 MG tablet Take 1 tablet (5 mg total) by mouth 4 (four) times daily. 120 tablet 1  . pantoprazole (PROTONIX) 40 MG tablet Take 1 tablet (40 mg total) by mouth 2 (two) times daily. One tablet before 1st and last meal of the day. 90 tablet 3  . SUMAtriptan 6 MG/0.5ML SOAJ   5  . topiramate (TOPAMAX) 100 MG tablet Take 2 tablets (200 mg total) by mouth at bedtime. 180 tablet 1  . traMADol (ULTRAM) 50 MG tablet Take 1 tablet (50 mg total) by mouth 3 (three) times daily. 90 tablet 2   No current facility-administered medications for this visit.     Allergies as of 08/27/2017 - Review Complete 08/27/2017  Allergen Reaction Noted  . Bactrim [sulfamethoxazole-trimethoprim] Rash and Other (See Comments) 11/04/2015    Family History  Problem Relation Age of Onset  . Diabetes Mother   . Heart disease Mother        AMI  . Heart failure Father   . Heart disease Father        CHF with defibrillator.  . Hypertension Sister   . Polycystic ovary syndrome Daughter     Social History   Socioeconomic History  . Marital status: Legally Separated    Spouse name: [name]  . Number of children: Not on file  . Years of education: 12th  . Highest education level: Not on file  Occupational History  . Occupation: Merchandiser, retailCook/Attendant    Employer: [employer]  .  Occupation: Waitress    Comment: [employer]  Social Needs  . Financial resource strain: Not on file  . Food insecurity:    Worry: Not on file    Inability: Not on file  . Transportation needs:    Medical: Not on file    Non-medical: Not on file  Tobacco Use  . Smoking status: Never Smoker  . Smokeless tobacco: Never Used  Substance and Sexual Activity  . Alcohol use: Yes    Comment: occasionally, 1/month  . Drug use: No  . Sexual activity: Yes    Birth control/protection: Injection, None    Comment: no menstruation in "years."  Lifestyle  . Physical activity:    Days per week: Not on file    Minutes per session: Not on  file  . Stress: Not on file  Relationships  . Social connections:    Talks on phone: Not on file    Gets together: Not on file    Attends religious service: Not on file    Active member of club or organization: Not on file    Attends meetings of clubs or organizations: Not on file    Relationship status: Not on file  . Intimate partner violence:    Fear of current or ex partner: Not on file    Emotionally abused: Not on file    Physically abused: Not on file    Forced sexual activity: Not on file  Other Topics Concern  . Not on file  Social History Narrative   Marital status: Married      Children: 3 children      Lives: with daughter and husband      Employment: works at [employer] in [place]      Tobacco; none      Alcohol:  Socially; weekends.      Drugs: none     Exercise: none    Review of Systems:    Constitutional: No weight loss, fever or chills Cardiovascular: No chest pain Respiratory: No SOB  Gastrointestinal: See HPI and otherwise negative   Physical Exam:  Vital signs: BP 108/60   Pulse (!) 52   Ht 5\' 2"  (1.575 m)   Wt 138 lb (62.6 kg)   BMI 25.24 kg/m   Constitutional:   Pleasant Caucasian female appears to be in NAD, Well developed, Well nourished, alert and cooperative Respiratory: Respirations even and unlabored. Lungs clear to auscultation bilaterally.   No wheezes, crackles, or rhonchi.  Cardiovascular: Normal S1, S2. No MRG. Regular rate and rhythm. No peripheral edema, cyanosis or pallor.  Gastrointestinal:  Soft, nondistended, nontender. No rebound or guarding. Normal bowel sounds. No appreciable masses or hepatomegaly.Marland Kitchen Psychiatric: Demonstrates good judgement and reason without abnormal affect or behaviors.  MOST RECENT LABS AND IMAGING: CBC    Component Value Date/Time   WBC 6.5 04/27/2017 0850   RBC 4.47 04/27/2017 0850   HGB 14.7 04/27/2017 0850   HCT 42.8 04/27/2017 0850   PLT 209.0 04/27/2017 0850   MCV 95.6 04/27/2017 0850   MCV  90.2 12/20/2015 1230   MCH 32.2 10/16/2016 0930   MCHC 34.2 04/27/2017 0850   RDW 12.9 04/27/2017 0850   LYMPHSABS 2.6 04/27/2017 0850   MONOABS 0.5 04/27/2017 0850   EOSABS 0.4 04/27/2017 0850   BASOSABS 0.1 04/27/2017 0850    CMP     Component Value Date/Time   NA 143 04/27/2017 0850   K 4.0 04/27/2017 0850   CL 113 (H) 04/27/2017 0850  CO2 23 04/27/2017 0850   GLUCOSE 94 04/27/2017 0850   BUN 11 04/27/2017 0850   CREATININE 0.72 04/27/2017 0850   CREATININE 0.61 05/27/2015 1900   CALCIUM 10.2 04/27/2017 0850   PROT 6.8 04/27/2017 0850   ALBUMIN 3.8 04/27/2017 0850   AST 13 04/27/2017 0850   ALT 11 04/27/2017 0850   ALKPHOS 44 04/27/2017 0850   BILITOT 0.4 04/27/2017 0850   GFRNONAA >60 10/16/2016 0930   GFRAA >60 10/16/2016 0930    Assessment: 1.  Screening for colorectal cancer: 52 years old, recommend a colonoscopy 2.  Chronic constipation: Better now on Linzess 72 mcg daily  Plan: 1.  Refilled Linzess 72 mcg daily #30 with 11 refills 2.  Patient agreed to schedule screening colonoscopy.  She is requesting a morning time slot and would like this done with Dr. Rhea Belton.  There are no available times which fit her schedule at this time and she will call back in a couple of weeks. 3.  Patient will follow in clinic after time of colonoscopy as recommended.  Hyacinth Meeker, PA-C [facility] 08/27/2017, 3:06 PM  Cc: Dianne Dun, MD   Addendum: Reviewed and agree with management. Pyrtle, Carie Caddy, MD

## 2017-09-25 ENCOUNTER — Other Ambulatory Visit: Payer: Self-pay | Admitting: Neurology

## 2017-09-25 MED ORDER — TOPIRAMATE 100 MG PO TABS: 200.0000 mg | ORAL_TABLET | Freq: Every day | ORAL | 0 refills | Status: DC

## 2017-09-28 ENCOUNTER — Telehealth: Payer: Self-pay | Admitting: Neurology

## 2017-10-02 DIAGNOSIS — F3340 Major depressive disorder, recurrent, in remission, unspecified: Secondary | ICD-10-CM | POA: Diagnosis not present

## 2017-10-02 DIAGNOSIS — F4310 Post-traumatic stress disorder, unspecified: Secondary | ICD-10-CM | POA: Diagnosis not present

## 2017-10-02 DIAGNOSIS — F411 Generalized anxiety disorder: Secondary | ICD-10-CM | POA: Diagnosis not present

## 2017-10-05 DIAGNOSIS — Z0279 Encounter for issue of other medical certificate: Secondary | ICD-10-CM

## 2017-10-12 ENCOUNTER — Telehealth: Payer: Self-pay | Admitting: Physician Assistant

## 2017-10-12 NOTE — Telephone Encounter (Signed)
The pt will continue to take protonix BID and has reglan that she takes 4 times daily.  She was advised to call her PCP regarding her increased stressed.

## 2017-10-12 NOTE — Telephone Encounter (Signed)
Patient states she has been really stressed lately and thinks it is making her ulcers worse. Patient is requesting dissolvable tablets that help with nausea be [facility], and pt wants advice or to know if she should come in for an ov.

## 2017-10-30 DIAGNOSIS — F411 Generalized anxiety disorder: Secondary | ICD-10-CM | POA: Diagnosis not present

## 2017-10-30 DIAGNOSIS — F4310 Post-traumatic stress disorder, unspecified: Secondary | ICD-10-CM | POA: Diagnosis not present

## 2017-10-30 DIAGNOSIS — F3340 Major depressive disorder, recurrent, in remission, unspecified: Secondary | ICD-10-CM | POA: Diagnosis not present

## 2017-11-01 NOTE — Progress Notes (Signed)
NEUROLOGY FOLLOW UP OFFICE NOTE  [name] [id number]  HISTORY OF PRESENT ILLNESS: [name] is a 52 year old right-handed female who follows up for migraines.  UPDATE: Intensity:  Mild to severe Duration:  4 to 7 days Frequency:  Varies.  Approximately 10 days a month of headache, while other months no headaches. Frequency of abortive medication: N/A Current NSAIDS:  Contraindicated (stomach ulcers) Current analgesics:  Excedrin Migraine, Tramadol (plantar fasciitis) Current triptans:  Sumatriptan 6mg  Esparto (ineffective) Current ergotamine:  no Current anti-emetic: Reglan 5mg  Current muscle relaxants:  no Current anti-anxiolytic:  no Current sleep aide:  no Current Antihypertensive medications:  no Current Antidepressant medications:  no Current Anticonvulsant medications:  topiramate 200mg  at bedtime Current anti-CGRP:  no Current Vitamins/Herbal/Supplements:  no Current Antihistamines/Decongestants:  no Other therapy:  no Hormone/birth control:  no  Alcohol:  seldom Smoker:  no Diet:  good Exercise:  yes Depression:  yes; Anxiety:  no Other pain:  no Sleep hygiene:  Sleep study revealed mild OSA.  Daytime somnolence despite CPAP.   HISTORY: Onset:  Since young adulthood Location:  Across forehead and left-sided Quality:  Pressure/pounding Initial Intensity:  10/10 Initial duration:  constant Initial frequency:  constant Aura:  no Prodrome:  no Associated symptoms:  Phonophobia, sometimes nausea.  No photophobia, vomiting, visual disturbance, autonomic symptoms or unilateral numbness or weakness. Usually occurs in clusters separated by headache-free periods of several months.  Current flare-up started over 2 months ago and has been constant and daily.  Last headache was 6 months ago. Triggers/aggravating factors:  Loud noise, emotional stress Relieving factors:  no Activity:  Able to function  Past NSAIDS:  ibuprofen, meloxicam,  naproxen Past analgesics:  Goodys, Norco, Excedrin, Tylenol, Fioricet Past abortive triptans:  Sumatriptan tablet, Maxalt.  Zomig 2.5mg  ineffective (5mg  effective but caused eye pressure) Relpax was too expensive. Past muscle relaxants:  Flexeril Past anti-emetic:  Zofran Past antihypertensive medications:  no Past antidepressant medications:  Prozac, venlafaxine XR 37.5mg , maybe nortriptyline many years ago for depression, Cymbalta (side effects)  Past anticonvulsant medications:  Gabapentin (for plantar fasciitis) Past vitamins/Herbal/Supplements:  no Other past therapies:  no  Family history of headache:  daughter  CT of head from 04/02/13 to evaluate headache was personally reviewed and revealed cerebellar tonsillar ectopia but no significant crowding at the foramen magnum.  PAST MEDICAL HISTORY: Past Medical History:  Diagnosis Date  . Anxiety   . Chronic headaches   . Depression   . Gall stones   . H. pylori infection   . Migraines   . Plantar fasciitis, bilateral     MEDICATIONS: Current Outpatient Medications on File Prior to Visit  Medication Sig Dispense Refill  . linaclotide (LINZESS) 72 MCG capsule Take 1 capsule (72 mcg total) by mouth daily before breakfast. 30 capsule 11  . metoCLOPramide (REGLAN) 5 MG tablet Take 1 tablet (5 mg total) by mouth 4 (four) times daily. 120 tablet 1  . pantoprazole (PROTONIX) 40 MG tablet Take 1 tablet (40 mg total) by mouth 2 (two) times daily. One tablet before 1st and last meal of the day. 90 tablet 3  . SUMAtriptan 6 MG/0.5ML SOAJ   5  . topiramate (TOPAMAX) 100 MG tablet Take 2 tablets (200 mg total) by mouth at bedtime. 180 tablet 0  . traMADol (ULTRAM) 50 MG tablet Take 1 tablet (50 mg total) by mouth 3 (three) times daily. 90 tablet 2   No current facility-administered medications on file prior to  visit.     ALLERGIES: Allergies  Allergen Reactions  . Bactrim [Sulfamethoxazole-Trimethoprim] Rash and Other (See Comments)     Pt states she developed a rash on arms, throat soreness, headache and ear pain.     FAMILY HISTORY: Family History  Problem Relation Age of Onset  . Diabetes Mother   . Heart disease Mother        AMI  . Heart failure Father   . Heart disease Father        CHF with defibrillator.  . Hypertension Sister   . Polycystic ovary syndrome Daughter    SOCIAL HISTORY: Social History   Socioeconomic History  . Marital status: Legally Separated    Spouse name: Loraine Leriche  . Number of children: Not on file  . Years of education: 12th  . Highest education level: Not on file  Occupational History  . Occupation: Merchandiser, retail: [employer]  . Occupation: Waitress    Comment: [employer]  Social Needs  . Financial resource strain: Not on file  . Food insecurity:    Worry: Not on file    Inability: Not on file  . Transportation needs:    Medical: Not on file    Non-medical: Not on file  Tobacco Use  . Smoking status: Never Smoker  . Smokeless tobacco: Never Used  Substance and Sexual Activity  . Alcohol use: Yes    Comment: occasionally, 1/month  . Drug use: No  . Sexual activity: Yes    Birth control/protection: Injection, None    Comment: no menstruation in "years."  Lifestyle  . Physical activity:    Days per week: Not on file    Minutes per session: Not on file  . Stress: Not on file  Relationships  . Social connections:    Talks on phone: Not on file    Gets together: Not on file    Attends religious service: Not on file    Active member of club or organization: Not on file    Attends meetings of clubs or organizations: Not on file    Relationship status: Not on file  . Intimate partner violence:    Fear of current or ex partner: Not on file    Emotionally abused: Not on file    Physically abused: Not on file    Forced sexual activity: Not on file  Other Topics Concern  . Not on file  Social History Narrative   Marital status: Married      Children: 3 children       Lives: with daughter and husband      Employment: works at [employer] in [employer]      Tobacco; none      Alcohol:  Socially; weekends.      Drugs: none     Exercise: none    REVIEW OF SYSTEMS: Constitutional: No fevers, chills, or sweats, no generalized fatigue, change in appetite Eyes: No visual changes, double vision, eye pain Ear, nose and throat: No hearing loss, ear pain, nasal congestion, sore throat Cardiovascular: No chest pain, palpitations Respiratory:  No shortness of breath at rest or with exertion, wheezes GastrointestinaI: No nausea, vomiting, diarrhea, abdominal pain, fecal incontinence Genitourinary:  No dysuria, urinary retention or frequency Musculoskeletal:  No neck pain, back pain Integumentary: No rash, pruritus, skin lesions Neurological: as above Psychiatric: No depression, insomnia, anxiety Endocrine: No palpitations, fatigue, diaphoresis, mood swings, change in appetite, change in weight, increased thirst Hematologic/Lymphatic:  No purpura, petechiae. Allergic/Immunologic: no  itchy/runny eyes, nasal congestion, recent allergic reactions, rashes  PHYSICAL EXAM: Blood pressure 90/68, pulse 75, height 5\' 2"  (1.575 m), weight 134 lb (60.8 kg), SpO2 97 %. General: No acute distress.  Patient appears well-groomed.   Head:  Normocephalic/atraumatic Eyes:  Fundi examined but not visualized Neck: supple, no paraspinal tenderness, full range of motion Heart:  Regular rate and rhythm Lungs:  Clear to auscultation bilaterally Back: No paraspinal tenderness Neurological Exam: alert and oriented to person, place, and time. Attention span and concentration intact, recent and remote memory intact, fund of knowledge intact.  Speech fluent and not dysarthric, language intact.  CN II-XII intact. Bulk and tone normal, muscle strength 5/5 throughout.  Sensation to light touch, temperature and vibration intact.  Deep tendon reflexes 2+ throughout, toes downgoing.  Finger to  nose and heel to shin testing intact.  Gait normal, Romberg negative.  IMPRESSION: Intractable migraine without aura, without status migrainosus  PLAN: 1.  We will start Ajovy.  She will remain on topiramate 200mg  at bedtime 2.  Stop sumatriptan injection.  Start Migranal NS for abortive therapy:  1 spray in each nostril, may repeat once in 15 minutes if needed. 3.  Limit use of pain relievers to no more than 2 days out of week to prevent risk of rebound or medication-overuse headache. 4.  Keep headache diary 5.  Follow up in 6 months.  Shon Millet, DO  CC: Ruthe Mannan, MD

## 2017-11-02 ENCOUNTER — Ambulatory Visit: Payer: BLUE CROSS/BLUE SHIELD | Admitting: Neurology

## 2017-11-02 ENCOUNTER — Encounter: Payer: Self-pay | Admitting: Neurology

## 2017-11-02 VITALS — BP 90/68 | HR 75 | Ht 62.0 in | Wt 134.0 lb

## 2017-11-02 DIAGNOSIS — G43019 Migraine without aura, intractable, without status migrainosus: Secondary | ICD-10-CM | POA: Diagnosis not present

## 2017-11-02 MED ORDER — DIHYDROERGOTAMINE MESYLATE 4 MG/ML NA SOLN: 1.0000 | NASAL | 0 refills | Status: DC | PRN

## 2017-11-02 MED ORDER — FREMANEZUMAB-VFRM 225 MG/1.5ML ~~LOC~~ SOSY: PREFILLED_SYRINGE | Freq: Once | SUBCUTANEOUS | Status: DC

## 2017-11-02 NOTE — Patient Instructions (Addendum)
1.  We will start one of the new monthly injections, Ajovy.  Continue topiramate 200mg  at bedtime 2.  Stop the sumatriptan shot.  Instead, try Migranal nasal spray: 1 spray in each nostril.  May repeat once in 15 minutes if needed (no more than 4 total sprays in 24 hours). Call our office if the Migranal is effective for you and we will call in a prescription. 3.  Limit use of pain relievers to no more than 2 days out of week to prevent risk of rebound or medication-overuse headache. 4.  Keep headache diary 5.  Follow up in 6 months.

## 2017-11-15 ENCOUNTER — Encounter (HOSPITAL_COMMUNITY): Payer: Self-pay | Admitting: Emergency Medicine

## 2017-11-15 ENCOUNTER — Emergency Department (HOSPITAL_COMMUNITY)
Admission: EM | Admit: 2017-11-15 | Discharge: 2017-11-15 | Disposition: A | Discharge status: Home / Self Care | Payer: BLUE CROSS/BLUE SHIELD | Attending: Emergency Medicine | Admitting: Emergency Medicine

## 2017-11-15 ENCOUNTER — Other Ambulatory Visit: Payer: Self-pay

## 2017-11-15 ENCOUNTER — Emergency Department (HOSPITAL_COMMUNITY): Payer: BLUE CROSS/BLUE SHIELD

## 2017-11-15 DIAGNOSIS — R0902 Hypoxemia: Secondary | ICD-10-CM | POA: Diagnosis not present

## 2017-11-15 DIAGNOSIS — Z79899 Other long term (current) drug therapy: Secondary | ICD-10-CM | POA: Insufficient documentation

## 2017-11-15 DIAGNOSIS — R072 Precordial pain: Secondary | ICD-10-CM | POA: Diagnosis not present

## 2017-11-15 DIAGNOSIS — R079 Chest pain, unspecified: Secondary | ICD-10-CM | POA: Diagnosis not present

## 2017-11-15 DIAGNOSIS — R0789 Other chest pain: Secondary | ICD-10-CM | POA: Diagnosis not present

## 2017-11-15 DIAGNOSIS — I959 Hypotension, unspecified: Secondary | ICD-10-CM | POA: Diagnosis not present

## 2017-11-15 LAB — BASIC METABOLIC PANEL
Anion gap: 9 (ref 5–15)
BUN: 13 mg/dL (ref 6–20)
CO2: 22 mmol/L (ref 22–32)
Calcium: 9.8 mg/dL (ref 8.9–10.3)
Chloride: 109 mmol/L (ref 98–111)
Creatinine, Ser: 0.78 mg/dL (ref 0.44–1.00)
GFR calc Af Amer: >60 mL/min (ref >60)
GLUCOSE: 77 mg/dL (ref 70–99)
Potassium: 3.6 mmol/L (ref 3.5–5.1)
Sodium: 140 mmol/L (ref 135–145)

## 2017-11-15 LAB — I-STAT TROPONIN, ED
Troponin i, poc: 0 ng/mL (ref 0.00–0.08)
Troponin i, poc: 0 ng/mL (ref 0.00–0.08)

## 2017-11-15 LAB — I-STAT BETA HCG BLOOD, ED (MC, WL, AP ONLY): I-stat hCG, quantitative: <5 m[IU]/mL (ref <5)

## 2017-11-15 LAB — CBC
HEMATOCRIT: 42.8 % (ref 36.0–46.0)
Hemoglobin: 13.8 g/dL (ref 12.0–15.0)
MCH: 31.4 pg (ref 26.0–34.0)
MCHC: 32.2 g/dL (ref 30.0–36.0)
MCV: 97.5 fL (ref 78.0–100.0)
Platelets: 188 10*3/uL (ref 150–400)
RBC: 4.39 MIL/uL (ref 3.87–5.11)
RDW: 12.1 % (ref 11.5–15.5)
WBC: 6.3 10*3/uL (ref 4.0–10.5)

## 2017-11-15 MED ORDER — ALUM & MAG HYDROXIDE-SIMETH 200-200-20 MG/5ML PO SUSP
30.0000 mL | Freq: Once | ORAL | Status: AC
  Administered 2017-11-15: 30 mL via ORAL
  Filled 2017-11-15: qty 30

## 2017-11-15 NOTE — ED Provider Notes (Signed)
[facility] EMERGENCY DEPARTMENT Provider Note   CSN: [id number] Arrival date & time: 11/15/17  0857     History   Chief Complaint Chief Complaint  Patient presents with  . Chest Pain    HPI [name] is a 52 y.o. female.  HPI 52 year old female presents the emergency department 4 days of constant midsternal chest discomfort.  With some radiation to her bilateral arms.  She denies back pain.  She reports a family history of early cardiac disease but has no other cardiac risk factors at this time.  Feels like a burning sharp discomfort in her anterior chest.  No fevers or chills.  No productive cough.  No history of DVT or pulmonary embolism.  No unilateral leg swelling.  Pain is mild to moderate in severity.  Aspirin and nitroglycerin given in route without significant change in her discomfort.   Past Medical History:  Diagnosis Date  . Anxiety   . Chronic headaches   . Depression   . Gall stones   . H. pylori infection   . Migraines   . Plantar fasciitis, bilateral     Patient Active Problem List   Diagnosis Date Noted  . Breast pain, right 07/09/2017  . Bacterial infection due to H. pylori 03/19/2017  . Nausea 02/07/2017  . OSA (obstructive sleep apnea) 05/29/2016  . Hypersomnia 04/06/2016  . Lumbar degenerative disc disease 05/07/2015  . Other mixed anxiety disorders 08/28/2014  . S/P laparoscopic cholecystectomy 07/05/2011  . CERVICAL STRAIN 05/19/2009  . ALLERGIC RHINITIS 06/22/2008  . Migraine 06/22/2008  . DEPRESSION, MAJOR, MODERATE 06/15/2008  . PARESTHESIA 06/15/2008  . PULMONARY NODULE 04/17/2007  . MENORRHAGIA 07/02/2006  . NOCTURIA 04/26/2006    Past Surgical History:  Procedure Laterality Date  . CHOLECYSTECTOMY  06/09/2011   Procedure: LAPAROSCOPIC CHOLECYSTECTOMY;  Surgeon: Liz Malady, MD;  Location: [facility] OR;  Service: General;  Laterality: N/A;  . DILATION AND CURETTAGE OF UTERUS    . ERCP  06/08/2011   Procedure: ENDOSCOPIC RETROGRADE CHOLANGIOPANCREATOGRAPHY (ERCP);  Surgeon: Theda Belfast, MD;  Location: [facility] ENDOSCOPY;  Service: Endoscopy;  Laterality: N/A;  . TONSILLECTOMY       OB History   None      Home Medications    Prior to Admission medications   Medication Sig Start Date End Date Taking? Authorizing Provider  dihydroergotamine (MIGRANAL) 4 MG/ML nasal spray Place 1 spray into the nose as needed for migraine. Use in one nostril as directed.  No more than 4 sprays in one hour 11/02/17  Yes Jaffe, Adam R, DO  Fremanezumab-vfrm (AJOVY) 225 MG/1.5ML SOSY Inject 1.5 mLs into the skin every 30 (thirty) days.   Yes [provider]  linaclotide Karlene Einstein) 72 MCG capsule Take 1 capsule (72 mcg total) by mouth daily before breakfast. 08/27/17  Yes Unk Lightning, PA  pantoprazole (PROTONIX) 40 MG tablet Take 1 tablet (40 mg total) by mouth 2 (two) times daily. One tablet before 1st and last meal of the day. Patient taking differently: Take 40 mg by mouth 2 (two) times daily as needed (acid reflux, stomach pain). One tablet before 1st and last meal of the day. 04/06/17  Yes Pyrtle, Carie Caddy, MD  topiramate (TOPAMAX) 100 MG tablet Take 2 tablets (200 mg total) by mouth at bedtime. Patient taking differently: Take 300 mg by mouth at bedtime.  09/25/17  Yes Jaffe, Adam R, DO  traMADol (ULTRAM) 50 MG tablet Take 1 tablet (50 mg total) by  mouth 3 (three) times daily. Patient taking differently: Take 50 mg by mouth 3 (three) times daily as needed for moderate pain.  02/02/17  Yes Regal, Kirstie PeriNorman S, DPM  metoCLOPramide (REGLAN) 5 MG tablet Take 1 tablet (5 mg total) by mouth 4 (four) times daily. Patient taking differently: Take 5 mg by mouth 4 (four) times daily as needed for nausea or vomiting.  05/02/17   Pyrtle, Carie CaddyJay M, MD    Family History Family History  Problem Relation Age of Onset  . Diabetes Mother   . Heart disease Mother        AMI  . Heart failure Father   . Heart disease  Father        CHF with defibrillator.  . Hypertension Sister   . Polycystic ovary syndrome Daughter     Social History Social History   Tobacco Use  . Smoking status: Never Smoker  . Smokeless tobacco: Never Used  Substance Use Topics  . Alcohol use: Yes    Comment: occasionally, 1/month  . Drug use: No     Allergies   Bactrim [sulfamethoxazole-trimethoprim]   Review of Systems Review of Systems  All other systems reviewed and are negative.    Physical Exam Updated Vital Signs BP 109/67 (BP Location: Right Arm)   Pulse 93   Temp 98 F (36.7 C) (Oral)   Resp 16   Ht 5\' 2"  (1.575 m)   Wt 60.8 kg   SpO2 100%   BMI 24.52 kg/m   Physical Exam  Constitutional: She is oriented to person, place, and time. She appears well-developed and well-nourished. No distress.  HENT:  Head: Normocephalic and atraumatic.  Eyes: EOM are normal.  Neck: Normal range of motion.  Cardiovascular: Normal rate, regular rhythm and normal heart sounds.  Pulmonary/Chest: Effort normal and breath sounds normal.  Abdominal: Soft. She exhibits no distension. There is no tenderness.  Musculoskeletal: Normal range of motion.  Neurological: She is alert and oriented to person, place, and time.  Skin: Skin is warm and dry.  Psychiatric: She has a normal mood and affect. Judgment normal.  Nursing note and vitals reviewed.    ED Treatments / Results  Labs (all labs ordered are listed, but only abnormal results are displayed) Labs Reviewed  BASIC METABOLIC PANEL  CBC  I-STAT TROPONIN, ED  I-STAT BETA HCG BLOOD, ED ([facility], [facility], [facility] ONLY)  I-STAT TROPONIN, ED    EKG EKG Interpretation #1 Date/Time:  Thursday November 15 2017 09:05:54 EDT Ventricular Rate:  67 PR Interval:    QRS Duration: 102 QT Interval:  479 QTC Calculation: 506 R Axis:   19 Text Interpretation:  Sinus rhythm Borderline low voltage, extremity leads Abnormal R-wave progression, early transition Borderline prolonged QT  interval No significant change was found Confirmed by Azalia Bilisampos, Cyla Haluska ([id number]) on 11/15/2017 9:54:31 AM   EKG Interpretation #2  Date/Time:  Thursday November 15 2017 11:16:39 EDT Ventricular Rate:  67 PR Interval:    QRS Duration: 107 QT Interval:  397 QTC Calculation: 420 R Axis:   28 Text Interpretation:  Sinus rhythm Borderline low voltage, extremity leads Nonspecific T abnrm, anterolateral leads No significant change was found Confirmed by Azalia Bilisampos, Estefanny Moler ([id number]) on 11/15/2017 4:11:46 PM          Radiology Dg Chest 2 View  Result Date: 11/15/2017 CLINICAL DATA:  Chest pain. EXAM: CHEST - 2 VIEW COMPARISON:  Radiographs of Jul 16, 2014. FINDINGS: The heart size and mediastinal contours are within normal limits.  Both lungs are clear. No pneumothorax or pleural effusion is noted. The visualized skeletal structures are unremarkable. IMPRESSION: No active cardiopulmonary disease. Electronically Signed   By: Lupita Raider, M.D.   On: 11/15/2017 09:54    Procedures Procedures (including critical care time)  Medications Ordered in ED Medications  alum & mag hydroxide-simeth (MAALOX/MYLANTA) 200-200-20 MG/5ML suspension 30 mL (30 mLs Oral Given 11/15/17 1119)     Initial Impression / Assessment and Plan / ED Course  I have reviewed the triage vital signs and the nursing notes.  Pertinent labs & imaging results that were available during my care of the patient were reviewed by me and considered in my medical decision making (see chart for details).     Atypical chest pain.  EKG x2 without ischemic changes.  Troponin negative x2.  Outpatient primary care and cardiology follow-up.  Patient is encouraged to return in the emergency department for new or worsening symptoms.  Nonspecific chest discomfort at this time.  May represent GI component.  Final Clinical Impressions(s) / ED Diagnoses   Final diagnoses:  Precordial chest pain    ED Discharge Orders    None       Azalia Bilis, MD 11/15/17 [phone]

## 2017-11-15 NOTE — ED Notes (Signed)
Patient transported to X-ray 

## 2017-11-15 NOTE — ED Triage Notes (Signed)
Per [facility] pt coming from work c/o mid chest pain x 4 days. Today pain began radiating into left arm and jaw. Given 324 aspirin 1 nitro en route pain 5/10.

## 2017-11-16 ENCOUNTER — Telehealth: Payer: Self-pay

## 2017-11-16 ENCOUNTER — Encounter: Payer: Self-pay | Admitting: Family Medicine

## 2017-11-16 NOTE — Telephone Encounter (Signed)
error:[id number] ° °

## 2017-11-16 NOTE — Telephone Encounter (Signed)
TB-Can you please see this? Pt needs a Hospital F/U/she can see another provider for this as we just discussed her shortened schedule for a week/IDK who to schedule her with though?

## 2017-11-16 NOTE — Telephone Encounter (Signed)
Copied from CRM [phone]. Topic: General - Other >> Nov 16, 2017 11:35 AM Maye Hidesoley, Sarah wrote: Reason for CRM: [facility].Pt needs ED f/u but wanted a Tuesday and with Dr Dayton MartesAron at 3:00.Please advise if pt can see another physican

## 2017-11-22 DIAGNOSIS — F4310 Post-traumatic stress disorder, unspecified: Secondary | ICD-10-CM | POA: Diagnosis not present

## 2017-11-22 DIAGNOSIS — F3340 Major depressive disorder, recurrent, in remission, unspecified: Secondary | ICD-10-CM | POA: Diagnosis not present

## 2017-11-22 DIAGNOSIS — F411 Generalized anxiety disorder: Secondary | ICD-10-CM | POA: Diagnosis not present

## 2017-11-23 ENCOUNTER — Ambulatory Visit: Payer: BLUE CROSS/BLUE SHIELD | Admitting: Nurse Practitioner

## 2017-11-23 ENCOUNTER — Encounter: Payer: Self-pay | Admitting: Nurse Practitioner

## 2017-11-23 VITALS — BP 100/72 | HR 78 | Temp 98.2°F | Ht 62.0 in | Wt 136.0 lb

## 2017-11-23 DIAGNOSIS — R0789 Other chest pain: Secondary | ICD-10-CM | POA: Diagnosis not present

## 2017-11-23 DIAGNOSIS — F321 Major depressive disorder, single episode, moderate: Secondary | ICD-10-CM

## 2017-11-23 DIAGNOSIS — Z8249 Family history of ischemic heart disease and other diseases of the circulatory system: Secondary | ICD-10-CM

## 2017-11-23 MED ORDER — TRAZODONE HCL 50 MG PO TABS: 25.0000 mg | ORAL_TABLET | Freq: Every day | ORAL | 3 refills | Status: DC

## 2017-11-23 NOTE — Patient Instructions (Signed)
You will be contacted to schedule appt with cardiology.   Living With Depression Everyone experiences occasional disappointment, sadness, and loss in their lives. When you are feeling down, blue, or sad for at least 2 weeks in a row, it may mean that you have depression. Depression can affect your thoughts and feelings, relationships, daily activities, and physical health. It is caused by changes in the way your brain functions. If you receive a diagnosis of depression, your health care provider will tell you which type of depression you have and what treatment options are available to you. If you are living with depression, there are ways to help you recover from it and also ways to prevent it from coming back. How to cope with lifestyle changes Coping with stress Stress is your body's reaction to life changes and events, both good and bad. Stressful situations may include:  Getting married.  The death of a spouse.  Losing a job.  Retiring.  Having a baby.  Stress can last just a few hours or it can be ongoing. Stress can play a major role in depression, so it is important to learn both how to cope with stress and how to think about it differently. Talk with your health care provider or a counselor if you would like to learn more about stress reduction. He or she may suggest some stress reduction techniques, such as:  Music therapy. This can include creating music or listening to music. Choose music that you enjoy and that inspires you.  Mindfulness-based meditation. This kind of meditation can be done while sitting or walking. It involves being aware of your normal breaths, rather than trying to control your breathing.  Centering prayer. This is a kind of meditation that involves focusing on a spiritual word or phrase. Choose a word, phrase, or sacred image that is meaningful to you and that brings you peace.  Deep breathing. To do this, expand your stomach and inhale slowly through  your nose. Hold your breath for 3-5 seconds, then exhale slowly, allowing your stomach muscles to relax.  Muscle relaxation. This involves intentionally tensing muscles then relaxing them.  Choose a stress reduction technique that fits your lifestyle and personality. Stress reduction techniques take time and practice to develop. Set aside 5-15 minutes a day to do them. Therapists can offer training in these techniques. The training may be covered by some insurance plans. Other things you can do to manage stress include:  Keeping a stress diary. This can help you learn what triggers your stress and ways to control your response.  Understanding what your limits are and saying no to requests or events that lead to a schedule that is too full.  Thinking about how you respond to certain situations. You may not be able to control everything, but you can control how you react.  Adding humor to your life by watching funny films or TV shows.  Making time for activities that help you relax and not feeling guilty about spending your time this way.  Medicines Your health care provider may suggest certain medicines if he or she feels that they will help improve your condition. Avoid using alcohol and other substances that may prevent your medicines from working properly (may interact). It is also important to:  Talk with your pharmacist or health care provider about all the medicines that you take, their possible side effects, and what medicines are safe to take together.  Make it your goal to take part in all  treatment decisions (shared decision-making). This includes giving input on the side effects of medicines. It is best if shared decision-making with your health care provider is part of your total treatment plan.  If your health care provider prescribes a medicine, you may not notice the full benefits of it for 4-8 weeks. Most people who are treated for depression need to be on medicine for at least  6-12 months after they feel better. If you are taking medicines as part of your treatment, do not stop taking medicines without first talking to your health care provider. You may need to have the medicine slowly decreased (tapered) over time to decrease the risk of harmful side effects. Relationships Your health care provider may suggest family therapy along with individual therapy and drug therapy. While there may not be family problems that are causing you to feel depressed, it is still important to make sure your family learns as much as they can about your mental health. Having your family's support can help make your treatment successful. How to recognize changes in your condition Everyone has a different response to treatment for depression. Recovery from major depression happens when you have not had signs of major depression for two months. This may mean that you will start to:  Have more interest in doing activities.  Feel less hopeless than you did 2 months ago.  Have more energy.  Overeat less often, or have better or improving appetite.  Have better concentration.  Your health care provider will work with you to decide the next steps in your recovery. It is also important to recognize when your condition is getting worse. Watch for these signs:  Having fatigue or low energy.  Eating too much or too little.  Sleeping too much or too little.  Feeling restless, agitated, or hopeless.  Having trouble concentrating or making decisions.  Having unexplained physical complaints.  Feeling irritable, angry, or aggressive.  Get help as soon as you or your family members notice these symptoms coming back. How to get support and help from others How to talk with friends and family members about your condition Talking to friends and family members about your condition can provide you with one way to get support and guidance. Reach out to trusted friends or family members, explain  your symptoms to them, and let them know that you are working with a health care provider to treat your depression. Financial resources Not all insurance plans cover mental health care, so it is important to check with your insurance carrier. If paying for co-pays or counseling services is a problem, search for a local or county mental health care center. They may be able to offer public mental health care services at low or no cost when you are not able to see a private health care provider. If you are taking medicine for depression, you may be able to get the generic form, which may be less expensive. Some makers of prescription medicines also offer help to patients who cannot afford the medicines they need. Follow these instructions at home:  Get the right amount and quality of sleep.  Cut down on using caffeine, tobacco, alcohol, and other potentially harmful substances.  Try to exercise, such as walking or lifting small weights.  Take over-the-counter and prescription medicines only as told by your health care provider.  Eat a healthy diet that includes plenty of vegetables, fruits, whole grains, low-fat dairy products, and lean protein. Do not eat a lot of foods  that are high in solid fats, added sugars, or salt.  Keep all follow-up visits as told by your health care provider. This is important. Contact a health care provider if:  You stop taking your antidepressant medicines, and you have any of these symptoms: ? Nausea. ? Headache. ? Feeling lightheaded. ? Chills and body aches. ? Not being able to sleep (insomnia).  You or your friends and family think your depression is getting worse. Get help right away if:  You have thoughts of hurting yourself or others. If you ever feel like you may hurt yourself or others, or have thoughts about taking your own life, get help right away. You can go to your nearest emergency department or call:  Your local emergency services (911 in the  [place]  A suicide crisis helpline, such as the National Suicide Prevention Lifeline at 626-352-7783. This is open 24-hours a day.  Summary  If you are living with depression, there are ways to help you recover from it and also ways to prevent it from coming back.  Work with your health care team to create a management plan that includes counseling, stress management techniques, and healthy lifestyle habits. This information is not intended to replace advice given to you by your health care provider. Make sure you discuss any questions you have with your health care provider. Document Released: 01/17/2016 Document Revised: 01/17/2016 Document Reviewed: 01/17/2016 Elsevier Interactive Patient Education  [employer].

## 2017-11-23 NOTE — Progress Notes (Signed)
Subjective:  Patient ID: [name], female    DOB: 10/16/1965  Age: 52 y.o. MRN: [id number]  CC: Hospitalization Follow-up (ED follow up for chest pain--still has some discomfort in chest area--think might e stress related,) and Leg Pain (leg cramping at night,going on alot. )  Depression       The patient presents with depression.  This is a chronic problem.  The current episode started more than 1 year ago.   The onset quality is gradual.   The problem occurs constantly.  The problem has been gradually worsening since onset.  Associated symptoms include decreased concentration, fatigue, insomnia, restlessness, decreased interest, appetite change, body aches and myalgias.  Associated symptoms include no helplessness, no hopelessness, not irritable, no headaches, no indigestion, not sad and no suicidal ideas.     The symptoms are aggravated by family issues, work stress and social issues.  Past treatments include SSRIs - Selective serotonin reuptake inhibitors and SNRIs - Serotonin and norepinephrine reuptake inhibitors.  Compliance with treatment is poor.  Past compliance problems include medication issues.  Risk factors include stress.   Past medical history includes anxiety and depression.     Pertinent negatives include no suicide attempts.  She think chest pain is due to increased stress at home and at work, but is also concerned due to her FHx of premature CAD/MI (mother, father and grand parents).  No tobacco use, no ETOH use  Reviewed past Medical, Social and Family history today.  Outpatient Medications Prior to Visit  Medication Sig Dispense Refill  . dihydroergotamine (MIGRANAL) 4 MG/ML nasal spray Place 1 spray into the nose as needed for migraine. Use in one nostril as directed.  No more than 4 sprays in one hour 2 mL 0  . Fremanezumab-vfrm (AJOVY) 225 MG/1.5ML SOSY Inject 1.5 mLs into the skin every 30 (thirty) days.    Marland Kitchen linaclotide (LINZESS) 72 MCG capsule Take 1 capsule  (72 mcg total) by mouth daily before breakfast. 30 capsule 11  . metoCLOPramide (REGLAN) 5 MG tablet Take 1 tablet (5 mg total) by mouth 4 (four) times daily. (Patient taking differently: Take 5 mg by mouth 4 (four) times daily as needed for nausea or vomiting. ) 120 tablet 1  . pantoprazole (PROTONIX) 40 MG tablet Take 1 tablet (40 mg total) by mouth 2 (two) times daily. One tablet before 1st and last meal of the day. (Patient taking differently: Take 40 mg by mouth 2 (two) times daily as needed (acid reflux, stomach pain). One tablet before 1st and last meal of the day.) 90 tablet 3  . topiramate (TOPAMAX) 100 MG tablet Take 2 tablets (200 mg total) by mouth at bedtime. (Patient taking differently: Take 300 mg by mouth at bedtime. ) 180 tablet 0  . traMADol (ULTRAM) 50 MG tablet Take 1 tablet (50 mg total) by mouth 3 (three) times daily. (Patient taking differently: Take 50 mg by mouth 3 (three) times daily as needed for moderate pain. ) 90 tablet 2   Facility-Administered Medications Prior to Visit  Medication Dose Route Frequency Provider Last Rate Last Dose  . Fremanezumab-vfrm SOSY   Subcutaneous Once Jaffe, Adam R, DO        ROS See HPI  Objective:  BP 100/72   Pulse 78   Temp 98.2 F (36.8 C) (Oral)   Ht 5\' 2"  (1.575 m)   Wt 136 lb (61.7 kg)   SpO2 99%   BMI 24.87 kg/m   BP Readings from  Last 3 Encounters:  11/23/17 100/72  11/15/17 109/67  11/02/17 90/68    Wt Readings from Last 3 Encounters:  11/23/17 136 lb (61.7 kg)  11/15/17 134 lb 0.6 oz (60.8 kg)  11/02/17 134 lb (60.8 kg)    Physical Exam  Constitutional: She appears well-developed and well-nourished. She is not irritable.  Cardiovascular: Normal rate, regular rhythm, normal heart sounds and intact distal pulses.  Pulmonary/Chest: Effort normal and breath sounds normal.  Musculoskeletal: She exhibits no edema.  Psychiatric: Her speech is normal. Her mood appears anxious. She is slowed. Thought content is not  delusional. She exhibits a depressed mood. She expresses no suicidal ideation. She expresses no suicidal plans and no homicidal plans.  tearful during exam  Vitals reviewed.   Lab Results  Component Value Date   WBC 6.3 11/15/2017   HGB 13.8 11/15/2017   HCT 42.8 11/15/2017   PLT 188 11/15/2017   GLUCOSE 77 11/15/2017   CHOL 221 (H) 02/07/2017   TRIG 86.0 02/07/2017   HDL 75.40 02/07/2017   LDLCALC 128 (H) 02/07/2017   ALT 11 04/27/2017   AST 13 04/27/2017   NA 140 11/15/2017   K 3.6 11/15/2017   CL 109 11/15/2017   CREATININE 0.78 11/15/2017   BUN 13 11/15/2017   CO2 22 11/15/2017   TSH 0.91 02/07/2017   INR 1.10 06/08/2011   HGBA1C 5.2 07/16/2014    Dg Chest 2 View  Result Date: 11/15/2017 CLINICAL DATA:  Chest pain. EXAM: CHEST - 2 VIEW COMPARISON:  Radiographs of Jul 16, 2014. FINDINGS: The heart size and mediastinal contours are within normal limits. Both lungs are clear. No pneumothorax or pleural effusion is noted. The visualized skeletal structures are unremarkable. IMPRESSION: No active cardiopulmonary disease. Electronically Signed   By: Lupita Raider, M.D.   On: 11/15/2017 09:54    Assessment & Plan:   [name] was seen today for hospitalization follow-up and leg pain.  Diagnoses and all orders for this visit:  Atypical chest pain -     Ambulatory referral to Cardiology  DEPRESSION, MAJOR, MODERATE -     traZODone (DESYREL) 50 MG tablet; Take 0.5-1 tablets (25-50 mg total) by mouth at bedtime.  Family history of premature CAD -     Ambulatory referral to Cardiology   I am having [name] start on traZODone. I am also having her maintain her traMADol, pantoprazole, metoCLOPramide, linaclotide, topiramate, dihydroergotamine, and Fremanezumab-vfrm. We will continue to administer Fremanezumab-vfrm.  Meds ordered this encounter  Medications  . traZODone (DESYREL) 50 MG tablet    Sig: Take 0.5-1 tablets (25-50 mg total) by mouth at bedtime.     Dispense:  30 tablet    Refill:  3    Order Specific Question:   Supervising Provider    Answer:   Ashley Royalty, CODY [[id number]]   Follow-up: Return in about 4 weeks (around 12/21/2017) for depression with Dr. Dayton Martes.  Alysia Penna, NP

## 2017-11-24 ENCOUNTER — Encounter: Payer: Self-pay | Admitting: Nurse Practitioner

## 2017-11-28 ENCOUNTER — Other Ambulatory Visit: Payer: Self-pay | Admitting: Neurology

## 2017-11-28 ENCOUNTER — Telehealth: Payer: Self-pay | Admitting: Neurology

## 2017-11-28 DIAGNOSIS — G43009 Migraine without aura, not intractable, without status migrainosus: Secondary | ICD-10-CM

## 2017-11-28 MED ORDER — FREMANEZUMAB-VFRM 225 MG/1.5ML ~~LOC~~ SOSY: 225.0000 mg | PREFILLED_SYRINGE | SUBCUTANEOUS | 11 refills | Status: DC

## 2017-11-28 NOTE — Addendum Note (Signed)
Addended by: Dorthy Cooler on: 11/28/2017 04:15 PM   Modules accepted: Orders

## 2017-11-28 NOTE — Telephone Encounter (Signed)
Called and spoke with Pt. She was afraid of how much the Ajovy was going to cost. I advised her I gave her  copay card that would allow her to get it for free. She asked if I would call the Rx to [facility] and she will have [facility] hold the Rx

## 2017-11-28 NOTE — Telephone Encounter (Signed)
Patient needs needs to know if we will call in a RX for the ajovy or does she come here to get the injection that she is suppose to have 12-02-17. [facility] [phone]. She is not sure if the medication will get here in time if she uses them if we are to call in a RX please call patient

## 2017-11-29 ENCOUNTER — Telehealth: Payer: Self-pay | Admitting: Neurology

## 2017-11-29 NOTE — Telephone Encounter (Signed)
Patient called and needed to let [name] know that her Ajovy through [facility] is needing a prior Authorization 1 [id number]. Please Call. Thanks

## 2017-11-29 NOTE — Telephone Encounter (Signed)
Initiating on cover my meds

## 2017-12-03 ENCOUNTER — Telehealth: Payer: Self-pay | Admitting: Neurology

## 2017-12-03 NOTE — Telephone Encounter (Signed)
Called and spoke with Pt, reminded her she had a co-pay card to use, she thought it still had to have a PA. Advised her was unable to submit online, called [facility], they do not handle PA's for this plan, will call [employer]. Advise Pt to use co-pay card, if unable to get Ajovy, call in the morning and she can come get another sample until we can get it worked out.

## 2017-12-03 NOTE — Telephone Encounter (Signed)
Patient called regarding having trouble with Ajovy and that her pharmacy had not received a Prior Auth on the medication. They said to have it marked Urgent and the Fax is [phone]. Thanks

## 2017-12-04 ENCOUNTER — Telehealth: Payer: Self-pay | Admitting: Neurology

## 2017-12-04 NOTE — Telephone Encounter (Signed)
Called [facility] advising Pt she may come by the office to pick up a sample of Ajovy. I did advise her I will check with [facility] as to why she was unable to use the co-pay card

## 2017-12-04 NOTE — Telephone Encounter (Signed)
Patient called in wanting to let you know that she couldn't get her medication at the pharmacy. She said to Leave a message when ready but she is needing the  Ajovy samples. Please call her at [phone]. Or we can call to let her know its ready. Thanks!

## 2017-12-07 ENCOUNTER — Telehealth: Payer: Self-pay

## 2017-12-07 NOTE — Telephone Encounter (Signed)
Received notice that pt's Ajovy has been approved  Approved 10/07/17-06/07/18

## 2017-12-07 NOTE — Telephone Encounter (Signed)
Received notice from [facility] advising of Ajovy Denial.

## 2017-12-14 ENCOUNTER — Ambulatory Visit (INDEPENDENT_AMBULATORY_CARE_PROVIDER_SITE_OTHER): Payer: BLUE CROSS/BLUE SHIELD

## 2017-12-14 ENCOUNTER — Ambulatory Visit: Payer: BLUE CROSS/BLUE SHIELD | Admitting: Podiatry

## 2017-12-14 ENCOUNTER — Encounter: Payer: Self-pay | Admitting: Podiatry

## 2017-12-14 ENCOUNTER — Other Ambulatory Visit: Payer: Self-pay | Admitting: Podiatry

## 2017-12-14 DIAGNOSIS — M722 Plantar fascial fibromatosis: Secondary | ICD-10-CM | POA: Diagnosis not present

## 2017-12-14 DIAGNOSIS — M79672 Pain in left foot: Secondary | ICD-10-CM

## 2017-12-14 DIAGNOSIS — M79671 Pain in right foot: Secondary | ICD-10-CM

## 2017-12-14 MED ORDER — TRIAMCINOLONE ACETONIDE 10 MG/ML IJ SUSP
10.0000 mg | Freq: Once | INTRAMUSCULAR | Status: AC
  Administered 2017-12-14: 10 mg

## 2017-12-14 MED ORDER — GABAPENTIN 300 MG PO CAPS: 300.0000 mg | ORAL_CAPSULE | Freq: Three times a day (TID) | ORAL | 3 refills | Status: DC

## 2017-12-14 NOTE — Patient Instructions (Signed)

## 2017-12-17 NOTE — Progress Notes (Signed)
Subjective:   Patient ID: [name], female   DOB: 52 y.o.   MRN: [id number]   HPI Patient presents stating that there is pain in the right over left arch and its been going on for a while but has worsened over the last 6 months   ROS      Objective:  Physical Exam  Neurovascular status intact with moderate depression of the arch with chronic inflammation noted of the mid arch area bilateral with pain worse on the right will be left currently     Assessment:  Inflammatory fasciitis bilateral with inflammation of the mid arch area and slightly more proximal to the mid arch and not at the insertion calcaneus     Plan:  Plantar fasciitis bilateral with inflammation fluid of the mid arch area.  Reviewed H&P and x-rays and today I injected the mid arch area right 3 mg Kenalog 5 mg Xylocaine and applied fascial brace bilateral to hold the arch is up and gave instructions for supportive shoes and reevaluate again in the next several weeks  X-rays indicate that there is moderate depression of the arch no indications of stress fracture arthritis with mild spur formation

## 2018-02-01 ENCOUNTER — Ambulatory Visit: Payer: BLUE CROSS/BLUE SHIELD | Admitting: Podiatry

## 2018-03-27 DIAGNOSIS — F4310 Post-traumatic stress disorder, unspecified: Secondary | ICD-10-CM | POA: Diagnosis not present

## 2018-03-27 DIAGNOSIS — F3340 Major depressive disorder, recurrent, in remission, unspecified: Secondary | ICD-10-CM | POA: Diagnosis not present

## 2018-03-27 DIAGNOSIS — F411 Generalized anxiety disorder: Secondary | ICD-10-CM | POA: Diagnosis not present

## 2018-03-29 ENCOUNTER — Ambulatory Visit: Payer: BLUE CROSS/BLUE SHIELD | Admitting: Family Medicine

## 2018-03-29 ENCOUNTER — Encounter: Payer: Self-pay | Admitting: Family Medicine

## 2018-03-29 VITALS — BP 128/80 | HR 79 | Temp 98.0°F | Ht 62.0 in | Wt 144.5 lb

## 2018-03-29 DIAGNOSIS — H6980 Other specified disorders of Eustachian tube, unspecified ear: Secondary | ICD-10-CM | POA: Insufficient documentation

## 2018-03-29 DIAGNOSIS — J309 Allergic rhinitis, unspecified: Secondary | ICD-10-CM | POA: Diagnosis not present

## 2018-03-29 DIAGNOSIS — F458 Other somatoform disorders: Secondary | ICD-10-CM | POA: Diagnosis not present

## 2018-03-29 MED ORDER — CETIRIZINE-PSEUDOEPHEDRINE ER 5-120 MG PO TB12: 1.0000 | ORAL_TABLET | Freq: Two times a day (BID) | ORAL | 0 refills | Status: DC

## 2018-03-29 NOTE — Patient Instructions (Signed)
Eustachian Tube Dysfunction  Eustachian tube dysfunction refers to a condition in which a blockage develops in the narrow passage that connects the middle ear to the back of the nose (eustachian tube). The eustachian tube regulates air pressure in the middle ear by letting air move between the ear and nose. It also helps to drain fluid from the middle ear space. Eustachian tube dysfunction can affect one or both ears. When the eustachian tube does not function properly, air pressure, fluid, or both can build up in the middle ear. What are the causes? This condition occurs when the eustachian tube becomes blocked or cannot open normally. Common causes of this condition include:  Ear infections.  Colds and other infections that affect the nose, mouth, and throat (upper respiratory tract).  Allergies.  Irritation from cigarette smoke.  Irritation from stomach acid coming up into the esophagus (gastroesophageal reflux). The esophagus is the tube that carries food from the mouth to the stomach.  Sudden changes in air pressure, such as from descending in an airplane or scuba diving.  Abnormal growths in the nose or throat, such as: ? Growths that line the nose (nasal polyps). ? Abnormal growth of cells (tumors). ? Enlarged tissue at the back of the throat (adenoids). What increases the risk? You are more likely to develop this condition if:  You smoke.  You are overweight.  You are a child who has: ? Certain birth defects of the mouth, such as cleft palate. ? Large tonsils or adenoids. What are the signs or symptoms? Common symptoms of this condition include:  A feeling of fullness in the ear.  Ear pain.  Clicking or popping noises in the ear.  Ringing in the ear.  Hearing loss.  Loss of balance.  Dizziness. Symptoms may get worse when the air pressure around you changes, such as when you travel to an area of high elevation, fly on an airplane, or go scuba diving. How is  this diagnosed? This condition may be diagnosed based on:  Your symptoms.  A physical exam of your ears, nose, and throat.  Tests, such as those that measure: ? The movement of your eardrum (tympanogram). ? Your hearing (audiometry). How is this treated? Treatment depends on the cause and severity of your condition.  In mild cases, you may relieve your symptoms by moving air into your ears. This is called "popping the ears."  In more severe cases, or if you have symptoms of fluid in your ears, treatment may include: ? Medicines to relieve congestion (decongestants). ? Medicines that treat allergies (antihistamines). ? Nasal sprays or ear drops that contain medicines that reduce swelling (steroids). ? A procedure to drain the fluid in your eardrum (myringotomy). In this procedure, a small tube is placed in the eardrum to:  Drain the fluid.  Restore the air in the middle ear space. ? A procedure to insert a balloon device through the nose to inflate the opening of the eustachian tube (balloon dilation). Follow these instructions at home: Lifestyle  Do not do any of the following until your health care provider approves: ? Travel to high altitudes. ? Fly in airplanes. ? Work in a pressurized cabin or room. ? Scuba dive.  Do not use any products that contain nicotine or tobacco, such as cigarettes and e-cigarettes. If you need help quitting, ask your health care provider.  Keep your ears dry. Wear fitted earplugs during showering and bathing. Dry your ears completely after. General instructions  Take over-the-counter   and prescription medicines only as told by your health care provider.  Use techniques to help pop your ears as recommended by your health care provider. These may include: ? Chewing gum. ? Yawning. ? Frequent, forceful swallowing. ? Closing your mouth, holding your nose closed, and gently blowing as if you are trying to blow air out of your nose.  Keep all  follow-up visits as told by your health care provider. This is important. Contact a health care provider if:  Your symptoms do not go away after treatment.  Your symptoms come back after treatment.  You are unable to pop your ears.  You have: ? A fever. ? Pain in your ear. ? Pain in your head or neck. ? Fluid draining from your ear.  Your hearing suddenly changes.  You become very dizzy.  You lose your balance. Summary  Eustachian tube dysfunction refers to a condition in which a blockage develops in the eustachian tube.  It can be caused by ear infections, allergies, inhaled irritants, or abnormal growths in the nose or throat.  Symptoms include ear pain, hearing loss, or ringing in the ears.  Mild cases are treated with maneuvers to unblock the ears, such as yawning or ear popping.  Severe cases are treated with medicines. Surgery may also be done (rare). This information is not intended to replace advice given to you by your health care provider. Make sure you discuss any questions you have with your health care provider. Document Released: 03/12/2015 Document Revised: 06/05/2017 Document Reviewed: 06/05/2017 Elsevier Interactive Patient Education  2019 [employer]  Temporomandibular Joint Syndrome  Temporomandibular joint syndrome (TMJ syndrome) is a condition that causes pain in the temporomandibular joints. These joints are located near your ears and allow your jaw to open and close. For people with TMJ syndrome, chewing, biting, or other movements of the jaw can be difficult or painful. TMJ syndrome is often mild and goes away within a few weeks. However, sometimes the condition becomes a long-term (chronic) problem. What are the causes? This condition may be caused by:  Grinding your teeth or clenching your jaw. Some people do this when they are under stress.  Arthritis.  Injury to the jaw.  Head or neck injury.  Teeth or dentures that are not aligned  well. In some cases, the cause of TMJ syndrome may not be known. What are the signs or symptoms? The most common symptom of this condition is an aching pain on the side of the head in the area of the TMJ. Other symptoms may include:  Pain when moving your jaw, such as when chewing or biting.  Being unable to open your jaw all the way.  Making a clicking sound when you open your mouth.  Headache.  Earache.  Neck or shoulder pain. How is this diagnosed? This condition may be diagnosed based on:  Your symptoms and medical history.  A physical exam. Your health care provider may check the range of motion of your jaw.  Imaging tests, such as X-rays or an MRI. You may also need to see your dentist, who will determine if your teeth and jaw are lined up correctly. How is this treated? TMJ syndrome often goes away on its own. If treatment is needed, the options may include:  Eating soft foods and applying ice or heat.  Medicines to relieve pain or inflammation.  Medicines or massage to relax the muscles.  A splint, bite plate, or mouthpiece to prevent teeth grinding or jaw  clenching.  Relaxation techniques or counseling to help reduce stress.  A therapy for pain in which an electrical current is applied to the nerves through the skin (transcutaneous electrical nerve stimulation).  Acupuncture. This is sometimes helpful to relieve pain.  Jaw surgery. This is rarely needed. Follow these instructions at home:  Eating and drinking  Eat a soft diet if you are having trouble chewing.  Avoid foods that require a lot of chewing. Do not chew gum. General instructions  Take over-the-counter and prescription medicines only as told by your health care provider.  If directed, put ice on the painful area. ? Put ice in a plastic bag. ? Place a towel between your skin and the bag. ? Leave the ice on for 20 minutes, 2-3 times a day.  Apply a warm, wet cloth (warm compress) to the  painful area as directed.  Massage your jaw area and do any jaw stretching exercises as told by your health care provider.  If you were given a splint, bite plate, or mouthpiece, wear it as told by your health care provider.  Keep all follow-up visits as told by your health care provider. This is important. Contact a health care provider if:  You are having trouble eating.  You have new or worsening symptoms. Get help right away if:  Your jaw locks open or closed. Summary  Temporomandibular joint syndrome (TMJ syndrome) is a condition that causes pain in the temporomandibular joints. These joints are located near your ears and allow your jaw to open and close.  TMJ syndrome is often mild and goes away within a few weeks. However, sometimes the condition becomes a long-term (chronic) problem.  Symptoms include an aching pain on the side of the head in the area of the TMJ, pain when chewing or biting, and being unable to open your jaw all the way. You may also make a clicking sound when you open your mouth.  TMJ syndrome often goes away on its own. If treatment is needed, it may include medicines to relieve pain, reduce inflammation, or relax the muscles. A splint, bite plate, or mouthpiece may also be used to prevent teeth grinding or jaw clenching. This information is not intended to replace advice given to you by your health care provider. Make sure you discuss any questions you have with your health care provider. Document Released: 11/08/2000 Document Revised: 03/27/2017 Document Reviewed: 03/27/2017 Elsevier Interactive Patient Education  2019 [employer].

## 2018-03-29 NOTE — Progress Notes (Signed)
Established Patient Office Visit  Subjective:  Patient ID: [name], female    DOB: 29-Dec-1965  Age: 53 y.o. MRN: [id number]  CC:  Chief Complaint  Patient presents with  . popping noise in ear    HPI [name] presents for evaluation of a 3 to 4-week history of popping and cracking in her ears.  Ears are not painful and do not feel congested.  They are just popping and cracking.  She also feels itching in her ears as well.  She is not aware of any postnasal drip and does not have itchy watery eyes or sneeze.  She does have a history in the chart of allergy rhinitis.  She has a history of bruxism with teeth clenching..  She is seeing her dentist for this.  They are planning on preparing by cards for her.  Past Medical History:  Diagnosis Date  . Anxiety   . Chronic headaches   . Depression   . Gall stones   . H. pylori infection   . Migraines   . Plantar fasciitis, bilateral     Past Surgical History:  Procedure Laterality Date  . CHOLECYSTECTOMY  06/09/2011   Procedure: LAPAROSCOPIC CHOLECYSTECTOMY;  Surgeon: Liz Malady, MD;  Location: [facility];  Service: General;  Laterality: N/A;  . DILATION AND CURETTAGE OF UTERUS    . ERCP  06/08/2011   Procedure: ENDOSCOPIC RETROGRADE CHOLANGIOPANCREATOGRAPHY (ERCP);  Surgeon: Theda Belfast, MD;  Location: [facility] ENDOSCOPY;  Service: Endoscopy;  Laterality: N/A;  . TONSILLECTOMY      Family History  Problem Relation Age of Onset  . Diabetes Mother   . Heart disease Mother        AMI  . Heart failure Father   . Heart disease Father        CHF with defibrillator.  . Hypertension Sister   . Polycystic ovary syndrome Daughter   . Heart disease Maternal Grandmother 50  . Heart disease Maternal Grandfather 84    Social History   Socioeconomic History  . Marital status: Legally Separated    Spouse name: Loraine Leriche  . Number of children: Not on file  . Years of education: 12th  . Highest education level: Not on file   Occupational History  . Occupation: Merchandiser, retail: [employer]  . Occupation: Waitress    Comment: [employer]  Social Needs  . Financial resource strain: Not on file  . Food insecurity:    Worry: Not on file    Inability: Not on file  . Transportation needs:    Medical: Not on file    Non-medical: Not on file  Tobacco Use  . Smoking status: Never Smoker  . Smokeless tobacco: Never Used  Substance and Sexual Activity  . Alcohol use: Yes    Comment: occasionally, 1/month  . Drug use: No  . Sexual activity: Yes    Birth control/protection: Injection, None    Comment: no menstruation in "years."  Lifestyle  . Physical activity:    Days per week: Not on file    Minutes per session: Not on file  . Stress: Not on file  Relationships  . Social connections:    Talks on phone: Not on file    Gets together: Not on file    Attends religious service: Not on file    Active member of club or organization: Not on file    Attends meetings of clubs or organizations: Not on file  Relationship status: Not on file  . Intimate partner violence:    Fear of current or ex partner: Not on file    Emotionally abused: Not on file    Physically abused: Not on file    Forced sexual activity: Not on file  Other Topics Concern  . Not on file  Social History Narrative   Marital status: Married      Children: 3 children      Lives: with daughter and husband      Employment: works at [employer] in [place]      Tobacco; none      Alcohol:  Socially; weekends.      Drugs: none     Exercise: none    Outpatient Medications Prior to Visit  Medication Sig Dispense Refill  . dihydroergotamine (MIGRANAL) 4 MG/ML nasal spray Place 1 spray into the nose as needed for migraine. Use in one nostril as directed.  No more than 4 sprays in one hour 2 mL 0  . Fremanezumab-vfrm (AJOVY) 225 MG/1.5ML SOSY Inject 1.5 mLs into the skin every 30 (thirty) days.    . Fremanezumab-vfrm (AJOVY) 225 MG/1.5ML SOSY  Inject 225 mg into the skin every 30 (thirty) days. 1 Syringe 11  . Fremanezumab-vfrm (AJOVY) 225 MG/1.5ML SOSY Inject 225 mg into the skin every 30 (thirty) days. 1 Syringe 11  . gabapentin (NEURONTIN) 300 MG capsule Take 1 capsule (300 mg total) by mouth 3 (three) times daily. 90 capsule 3  . linaclotide (LINZESS) 72 MCG capsule Take 1 capsule (72 mcg total) by mouth daily before breakfast. 30 capsule 11  . metoCLOPramide (REGLAN) 5 MG tablet Take 1 tablet (5 mg total) by mouth 4 (four) times daily. (Patient taking differently: Take 5 mg by mouth 4 (four) times daily as needed for nausea or vomiting. ) 120 tablet 1  . pantoprazole (PROTONIX) 40 MG tablet Take 1 tablet (40 mg total) by mouth 2 (two) times daily. One tablet before 1st and last meal of the day. (Patient taking differently: Take 40 mg by mouth 2 (two) times daily as needed (acid reflux, stomach pain). One tablet before 1st and last meal of the day.) 90 tablet 3  . topiramate (TOPAMAX) 100 MG tablet TAKE 2 TABLETS AT BEDTIME 180 tablet 4  . traMADol (ULTRAM) 50 MG tablet Take 1 tablet (50 mg total) by mouth 3 (three) times daily. (Patient taking differently: Take 50 mg by mouth 3 (three) times daily as needed for moderate pain. ) 90 tablet 2  . traZODone (DESYREL) 50 MG tablet Take 0.5-1 tablets (25-50 mg total) by mouth at bedtime. 30 tablet 3   Facility-Administered Medications Prior to Visit  Medication Dose Route Frequency Provider Last Rate Last Dose  . Fremanezumab-vfrm SOSY   Subcutaneous Once Drema Dallas, DO        Allergies  Allergen Reactions  . Bactrim [Sulfamethoxazole-Trimethoprim] Rash and Other (See Comments)    Pt states she developed a rash on arms, throat soreness, headache and ear pain.     ROS Review of Systems  Constitutional: Negative for chills, diaphoresis, fatigue, fever and unexpected weight change.  HENT: Positive for dental problem and hearing loss. Negative for congestion, ear discharge, ear pain,  postnasal drip, rhinorrhea, sinus pressure, sinus pain, sneezing and sore throat.   Eyes: Negative.   Respiratory: Negative.   Cardiovascular: Negative.   Gastrointestinal: Negative.   Endocrine: Negative.   Musculoskeletal: Negative for arthralgias and myalgias.  Skin: Negative for pallor and  rash.  Neurological: Positive for headaches (migraines). Negative for seizures, speech difficulty and numbness.      Objective:    Physical Exam  Constitutional: She is oriented to person, place, and time. She appears well-developed and well-nourished. No distress.  HENT:  Head: Normocephalic and atraumatic.  Right Ear: External ear normal. No foreign bodies. Tympanic membrane is not injected, not scarred, not erythematous and not retracted. No middle ear effusion.  Left Ear: External ear normal. No foreign bodies. Tympanic membrane is not injected, not erythematous and not retracted.  No middle ear effusion.  Mouth/Throat: Oropharynx is clear and moist. No oropharyngeal exudate.  Eyes: Pupils are equal, round, and reactive to light. Conjunctivae are normal. Right eye exhibits no discharge. Left eye exhibits no discharge. No scleral icterus.  Neck: Neck supple. No JVD present. No tracheal deviation present. No thyromegaly present.  Cardiovascular: Normal rate, regular rhythm and normal heart sounds.  Pulmonary/Chest: Effort normal and breath sounds normal. No stridor.  Lymphadenopathy:    She has no cervical adenopathy.  Neurological: She is alert and oriented to person, place, and time.  Skin: Skin is warm and dry. She is not diaphoretic.  Psychiatric: She has a normal mood and affect. Her behavior is normal.    BP 128/80   Pulse 79   Temp 98 F (36.7 C) (Oral)   Ht 5\' 2"  (1.575 m)   Wt 144 lb 8 oz (65.5 kg)   SpO2 97%   BMI 26.43 kg/m  Wt Readings from Last 3 Encounters:  03/29/18 144 lb 8 oz (65.5 kg)  11/23/17 136 lb (61.7 kg)  11/15/17 134 lb 0.6 oz (60.8 kg)   BP Readings  from Last 3 Encounters:  03/29/18 128/80  11/23/17 100/72  11/15/17 109/67   Guideline developer:  UpToDate (see UpToDate for funding source) Date Released: June 2014  Health Maintenance Due  Topic Date Due  . HIV Screening  07/06/1982  . TETANUS/TDAP  01/28/2011  . COLONOSCOPY  07/05/2017  . INFLUENZA VACCINE  09/27/2017    There are no preventive care reminders to display for this patient.  Lab Results  Component Value Date   TSH 0.91 02/07/2017   Lab Results  Component Value Date   WBC 6.3 11/15/2017   HGB 13.8 11/15/2017   HCT 42.8 11/15/2017   MCV 97.5 11/15/2017   PLT 188 11/15/2017   Lab Results  Component Value Date   NA 140 11/15/2017   K 3.6 11/15/2017   CO2 22 11/15/2017   GLUCOSE 77 11/15/2017   BUN 13 11/15/2017   CREATININE 0.78 11/15/2017   BILITOT 0.4 04/27/2017   ALKPHOS 44 04/27/2017   AST 13 04/27/2017   ALT 11 04/27/2017   PROT 6.8 04/27/2017   ALBUMIN 3.8 04/27/2017   CALCIUM 9.8 11/15/2017   ANIONGAP 9 11/15/2017   GFR 91.20 04/27/2017   Lab Results  Component Value Date   CHOL 221 (H) 02/07/2017   Lab Results  Component Value Date   HDL 75.40 02/07/2017   Lab Results  Component Value Date   LDLCALC 128 (H) 02/07/2017   Lab Results  Component Value Date   TRIG 86.0 02/07/2017   Lab Results  Component Value Date   CHOLHDL 3 02/07/2017   Lab Results  Component Value Date   HGBA1C 5.2 07/16/2014      Assessment & Plan:   Problem List Items Addressed This Visit      Respiratory   Allergic rhinitis   Relevant Medications  cetirizine-pseudoephedrine (ZYRTEC-D ALLERGY & CONGESTION) 5-120 MG tablet     Nervous and Auditory   Dysfunction of eustachian tube - Primary   Relevant Medications   cetirizine-pseudoephedrine (ZYRTEC-D ALLERGY & CONGESTION) 5-120 MG tablet      Meds ordered this encounter  Medications  . cetirizine-pseudoephedrine (ZYRTEC-D ALLERGY & CONGESTION) 5-120 MG tablet    Sig: Take 1 tablet by  mouth 2 (two) times daily.    Dispense:  30 tablet    Refill:  0    Follow-up: Return if symptoms worsen or fail to improve.   Believe the patient's symptoms could be secondary to eustachian tube dysfunction and allergy rhinitis.  Try Zyrtec-D.  There may be a component of TMJ disease associated with her symptoms.  Patient was given

## 2018-04-08 ENCOUNTER — Emergency Department (HOSPITAL_BASED_OUTPATIENT_CLINIC_OR_DEPARTMENT_OTHER): Payer: BLUE CROSS/BLUE SHIELD

## 2018-04-08 ENCOUNTER — Other Ambulatory Visit: Payer: Self-pay

## 2018-04-08 ENCOUNTER — Encounter (HOSPITAL_BASED_OUTPATIENT_CLINIC_OR_DEPARTMENT_OTHER): Payer: Self-pay | Admitting: Emergency Medicine

## 2018-04-08 ENCOUNTER — Emergency Department (HOSPITAL_BASED_OUTPATIENT_CLINIC_OR_DEPARTMENT_OTHER)
Admission: EM | Admit: 2018-04-08 | Discharge: 2018-04-08 | Disposition: A | Discharge status: Home / Self Care | Payer: BLUE CROSS/BLUE SHIELD | Attending: Emergency Medicine | Admitting: Emergency Medicine

## 2018-04-08 DIAGNOSIS — Z79899 Other long term (current) drug therapy: Secondary | ICD-10-CM | POA: Diagnosis not present

## 2018-04-08 DIAGNOSIS — R05 Cough: Secondary | ICD-10-CM | POA: Diagnosis not present

## 2018-04-08 DIAGNOSIS — R1030 Lower abdominal pain, unspecified: Secondary | ICD-10-CM | POA: Diagnosis not present

## 2018-04-08 DIAGNOSIS — N201 Calculus of ureter: Secondary | ICD-10-CM

## 2018-04-08 DIAGNOSIS — R109 Unspecified abdominal pain: Secondary | ICD-10-CM

## 2018-04-08 DIAGNOSIS — M5489 Other dorsalgia: Secondary | ICD-10-CM | POA: Diagnosis not present

## 2018-04-08 LAB — URINALYSIS, MICROSCOPIC (REFLEX)

## 2018-04-08 LAB — URINALYSIS, ROUTINE W REFLEX MICROSCOPIC
Bilirubin Urine: NEGATIVE
GLUCOSE, UA: NEGATIVE mg/dL
Ketones, ur: NEGATIVE mg/dL
Leukocytes, UA: NEGATIVE
Nitrite: NEGATIVE
Protein, ur: NEGATIVE mg/dL
Specific Gravity, Urine: 1.025 (ref 1.005–1.030)
pH: 6 (ref 5.0–8.0)

## 2018-04-08 LAB — TROPONIN I: Troponin I: <0.03 ng/mL (ref <0.03)

## 2018-04-08 MED ORDER — HYDROCODONE-ACETAMINOPHEN 5-325 MG PO TABS: 1.0000 | ORAL_TABLET | Freq: Four times a day (QID) | ORAL | 0 refills | Status: DC | PRN

## 2018-04-08 MED ORDER — HYDROCODONE-ACETAMINOPHEN 5-325 MG PO TABS: 1.0000 | ORAL_TABLET | ORAL | 0 refills | Status: DC | PRN

## 2018-04-08 NOTE — ED Notes (Signed)
ED Provider at bedside. 

## 2018-04-08 NOTE — ED Provider Notes (Signed)
[facility] EMERGENCY DEPARTMENT Provider Note   CSN: [id number] Arrival date & time: 04/08/18  0720     History   Chief Complaint Chief Complaint  Patient presents with  . Back Pain    HPI [name] is a 53 y.o. female.  HPI Patient presents with mid back pain.  Woke up this morning.  Has been coughing over the weekend but not really bringing anything up.  No trauma.  No fevers.  Somewhat worse with movement.  Has had lower back pain and degenerative disc but not really had upper back pain.  No fevers or chills.  Pain is dull.  Had to come in from work because the pain was too severe.  Patient does not smoke.  Pain is worse with certain movements. Past Medical History:  Diagnosis Date  . Anxiety   . Chronic headaches   . Depression   . Gall stones   . H. pylori infection   . Migraines   . Plantar fasciitis, bilateral     Patient Active Problem List   Diagnosis Date Noted  . Dysfunction of eustachian tube 03/29/2018  . Bruxism 03/29/2018  . Breast pain, right 07/09/2017  . Bacterial infection due to H. pylori 03/19/2017  . Nausea 02/07/2017  . OSA (obstructive sleep apnea) 05/29/2016  . Hypersomnia 04/06/2016  . Lumbar degenerative disc disease 05/07/2015  . Other mixed anxiety disorders 08/28/2014  . S/P laparoscopic cholecystectomy 07/05/2011  . CERVICAL STRAIN 05/19/2009  . Allergic rhinitis 06/22/2008  . Migraine 06/22/2008  . DEPRESSION, MAJOR, MODERATE 06/15/2008  . PARESTHESIA 06/15/2008  . PULMONARY NODULE 04/17/2007  . MENORRHAGIA 07/02/2006  . NOCTURIA 04/26/2006    Past Surgical History:  Procedure Laterality Date  . CHOLECYSTECTOMY  06/09/2011   Procedure: LAPAROSCOPIC CHOLECYSTECTOMY;  Surgeon: Liz MaladyBurke E Thompson, MD;  [facility];  Service: General;  Laterality: N/A;  . DILATION AND CURETTAGE OF UTERUS    . ERCP  06/08/2011   Procedure: ENDOSCOPIC RETROGRADE CHOLANGIOPANCREATOGRAPHY (ERCP);  Surgeon: Theda BelfastPatrick D Hung, MD;   [facility];  Service: Endoscopy;  Laterality: N/A;  . TONSILLECTOMY       OB History   No obstetric history on file.      Home Medications    Prior to Admission medications   Medication Sig Start Date End Date Taking? Authorizing Provider  cetirizine-pseudoephedrine (ZYRTEC-D ALLERGY & CONGESTION) 5-120 MG tablet Take 1 tablet by mouth 2 (two) times daily. 03/29/18   Mliss SaxKremer, William Alfred, MD  dihydroergotamine (MIGRANAL) 4 MG/ML nasal spray Place 1 spray into the nose as needed for migraine. Use in one nostril as directed.  No more than 4 sprays in one hour 11/02/17   Jaffe, Adam R, DO  Fremanezumab-vfrm (AJOVY) 225 MG/1.5ML SOSY Inject 225 mg into the skin every 30 (thirty) days. 11/28/17   Drema DallasJaffe, Adam R, DO  HYDROcodone-acetaminophen (NORCO/VICODIN) 5-325 MG tablet Take 1-2 tablets by mouth every 4 (four) hours as needed. 04/08/18   Benjiman CorePickering, Sharda Keddy, MD  linaclotide [facility](LINZESS) 72 MCG capsule Take 1 capsule (72 mcg total) by mouth daily before breakfast. 08/27/17   Unk LightningLemmon, Jennifer Lynne, PA  pantoprazole (PROTONIX) 40 MG tablet Take 1 tablet (40 mg total) by mouth 2 (two) times daily. One tablet before 1st and last meal of the day. Patient taking differently: Take 40 mg by mouth 2 (two) times daily as needed (acid reflux, stomach pain). One tablet before 1st and last meal of the day. 04/06/17   Pyrtle, Carie CaddyJay M, MD  topiramate (TOPAMAX) 100 MG tablet TAKE 2 TABLETS AT BEDTIME 11/28/17   Drema DallasJaffe, Adam R, DO    Family History Family History  Problem Relation Age of Onset  . Diabetes Mother   . Heart disease Mother        AMI  . Heart failure Father   . Heart disease Father        CHF with defibrillator.  . Hypertension Sister   . Polycystic ovary syndrome Daughter   . Heart disease Maternal Grandmother 50  . Heart disease Maternal Grandfather 7050    Social History Social History   Tobacco Use  . Smoking status: Never Smoker  . Smokeless tobacco: Never Used  Substance Use  Topics  . Alcohol use: Yes    Comment: occasionally, 1/month  . Drug use: No     Allergies   Bactrim [sulfamethoxazole-trimethoprim]   Review of Systems Review of Systems  Constitutional: Negative for appetite change.  HENT: Negative for congestion.   Respiratory: Positive for cough.   Cardiovascular: Negative for chest pain.  Gastrointestinal: Negative for abdominal pain.  Genitourinary: Negative for flank pain.  Musculoskeletal: Positive for back pain.  Skin: Negative for rash.  Neurological: Negative for seizures and weakness.     Physical Exam Updated Vital Signs BP 115/79 (BP Location: Left Arm)   Pulse (!) 101   Temp 97.7 F (36.5 C) (Oral)   Resp 16   Ht 5\' 2"  (1.575 m)   Wt 63.5 kg   SpO2 99%   BMI 25.61 kg/m   Physical Exam HENT:     Head: Normocephalic.  Eyes:     General: No scleral icterus. Cardiovascular:     Rate and Rhythm: Regular rhythm.  Pulmonary:     Breath sounds: Normal breath sounds.  Abdominal:     Tenderness: There is no abdominal tenderness.  Musculoskeletal:     Comments: Some tenderness over mid back mostly to the right side of the spine.  No rash.  No crepitance or deformity.  Skin:    General: Skin is warm.     Capillary Refill: Capillary refill takes less than 2 seconds.  Neurological:     Mental Status: She is alert and oriented to person, place, and time. Mental status is at baseline.  Psychiatric:        Mood and Affect: Mood normal.      ED Treatments / Results  Labs (all labs ordered are listed, but only abnormal results are displayed) Labs Reviewed  URINALYSIS, ROUTINE W REFLEX MICROSCOPIC - Abnormal; Notable for the following components:      Result Value   Hgb urine dipstick MODERATE (*)    All other components within normal limits  URINALYSIS, MICROSCOPIC (REFLEX) - Abnormal; Notable for the following components:   Bacteria, UA FEW (*)    All other components within normal limits  TROPONIN I     EKG EKG Interpretation  Date/Time:  Monday April 08 2018 07:39:49 EST Ventricular Rate:  107 PR Interval:    QRS Duration: 87 QT Interval:  272 QTC Calculation: 363 R Axis:   1 Text Interpretation:  Sinus tachycardia Borderline low voltage, extremity leads Borderline repolarization abnormality No significant change since last tracing Confirmed by Benjiman CorePickering, Jumar Greenstreet [phone]) on 04/08/2018 8:09:23 AM   Radiology Dg Chest 2 View  Result Date: 04/08/2018 CLINICAL DATA:  53 year old female with back pain and new onset cough EXAM: CHEST - 2 VIEW COMPARISON:  Prior chest x-ray 11/14/2017 FINDINGS: The lungs are clear  and negative for focal airspace consolidation, pulmonary edema or suspicious pulmonary nodule. No pleural effusion or pneumothorax. Cardiac and mediastinal contours are within normal limits. No acute fracture or lytic or blastic osseous lesions. The visualized upper abdominal bowel gas pattern is unremarkable. Surgical clips in the right upper quadrant suggest prior cholecystectomy. IMPRESSION: Negative chest x-ray. Electronically Signed   By: Malachy Moan M.D.   On: 04/08/2018 07:50   Ct Renal Stone Study  Result Date: 04/08/2018 CLINICAL DATA:  Right flank pain and hematuria EXAM: CT ABDOMEN AND PELVIS WITHOUT CONTRAST TECHNIQUE: Multidetector CT imaging of the abdomen and pelvis was performed following the standard protocol without oral or IV contrast. COMPARISON:  March 26, 2007 FINDINGS: Lower chest: There is mild posterior bibasilar atelectasis. Hepatobiliary: No focal liver lesions are appreciable on this noncontrast enhanced study. Gallbladder is absent. There is no appreciable intrahepatic biliary duct dilatation. The proximal common bile duct is prominent at 12 mm with tapering distally. No biliary duct mass or calculus is demonstrable on this study. Appreciable Pancreas: There is no pancreatic mass or inflammatory focus. Spleen: No splenic lesions are evident. There  is an accessory spleen medially. Adrenals/Urinary Tract: Adrenals bilaterally appear normal. There is a 4 mm cyst in the lower pole the right kidney. There is no appreciable hydronephrosis on either side. There is no evident renal calculus on either side. There is a calculus in the right ureter at the level of S1 measuring 4 x 3 mm. Slightly more distally, there is a 1 mm calculus. Slightly more distally, there is a 2 mm calculus with an adjacent 1 mm calculus in a third calculus measuring 4 x 3 mm. No left-sided ureteral calculi are evident. The urinary bladder is midline with wall thickness within normal limits. Stomach/Bowel: There is no appreciable bowel wall or mesenteric thickening. There is no evident bowel obstruction. There is no free air or portal venous air. Vascular/Lymphatic: There is aortic and iliac artery atherosclerosis. No aneurysm evident. Major mesenteric arterial vessels do not show calcification. There is no adenopathy in the abdomen or pelvis. Reproductive: The uterus is anteverted. There is no appreciable pelvic mass. Other: Appendix is diminutive. No appendiceal region inflammation evident. No abscess or ascites is appreciable in the abdomen or pelvis. Musculoskeletal: There is degenerative change in the lumbar spine with vacuum phenomenon at L4-5. No blastic or lytic bone lesions. No intramuscular or abdominal wall lesion. IMPRESSION: 1. There are several calculi in the right ureter ranging in size from as small as 1 mm to as large as 4 mm without appreciable hydronephrosis. These calculi are seen in the right ureter at the upper to mid sacral levels. No intrarenal calculi identified on either side. No left-sided left ureteral calculus. 2. No evident bowel obstruction. No abscess in the abdomen pelvis. No periappendiceal region inflammation. 3. Gallbladder absent. Mild prominence of the common bile duct is noted with tapering distally. No biliary duct mass or calculus is appreciable by CT.  4.  Aortoiliac atherosclerosis. Electronically Signed   By: Bretta Bang III M.D.   On: 04/08/2018 08:55    Procedures Procedures (including critical care time)  Medications Ordered in ED Medications - No data to display   Initial Impression / Assessment and Plan / ED Course  I have reviewed the triage vital signs and the nursing notes.  Pertinent labs & imaging results that were available during my care of the patient were reviewed by me and considered in my medical decision making (see chart  for details).     Patient with flank pain.  Initially sounded more musculoskeletal but did have some blood in the urine.  CT scan done and showed ureteral stones without hydroureter.  No infection.  Will treat symptomatically.  Follow-up with urology.  No known history of ureteral stones.  Final Clinical Impressions(s) / ED Diagnoses   Final diagnoses:  Flank pain  Right ureteral stone    ED Discharge Orders         Ordered    HYDROcodone-acetaminophen (NORCO/VICODIN) 5-325 MG tablet  Every 4 hours PRN     04/08/18 1004           Benjiman Core, MD 04/08/18 1008

## 2018-04-08 NOTE — ED Triage Notes (Signed)
Woke up this morning with back pain from upper to lower back.  Developed a cough over the weekend.  Sts she has hx of low back pain but not upper and middle back.

## 2018-04-08 NOTE — ED Notes (Signed)
Patient transported to X-ray 

## 2018-05-03 ENCOUNTER — Ambulatory Visit: Payer: Self-pay | Admitting: Neurology

## 2018-05-17 ENCOUNTER — Ambulatory Visit: Payer: Self-pay | Admitting: Adult Health

## 2018-07-08 ENCOUNTER — Other Ambulatory Visit (INDEPENDENT_AMBULATORY_CARE_PROVIDER_SITE_OTHER): Payer: BLUE CROSS/BLUE SHIELD

## 2018-07-08 ENCOUNTER — Ambulatory Visit: Payer: BLUE CROSS/BLUE SHIELD | Admitting: Family Medicine

## 2018-07-08 ENCOUNTER — Other Ambulatory Visit: Payer: Self-pay

## 2018-07-08 DIAGNOSIS — R300 Dysuria: Secondary | ICD-10-CM

## 2018-07-08 LAB — POCT URINALYSIS DIPSTICK
Bilirubin, UA: NEGATIVE
Glucose, UA: NEGATIVE
Leukocytes, UA: NEGATIVE
Nitrite, UA: NEGATIVE
Protein, UA: NEGATIVE
Spec Grav, UA: 1.02 (ref 1.010–1.025)
Urobilinogen, UA: 0.2 E.U./dL
pH, UA: 6 (ref 5.0–8.0)

## 2018-07-08 MED ORDER — ONDANSETRON HCL 4 MG PO TABS: 4.0000 mg | ORAL_TABLET | Freq: Three times a day (TID) | ORAL | 0 refills | Status: DC | PRN

## 2018-07-08 NOTE — Progress Notes (Signed)
Pt is C/O nausea with urinary Sx/per verbal from TA ok to send in Zofran #10/completed/thx dmf

## 2018-07-08 NOTE — Progress Notes (Signed)
POCT Urine and Urine C&S ordered per TA as there are system wide Epic issues and provider will have virtual visit at 9:30am on 5.12.20/thx dmf

## 2018-07-08 NOTE — Progress Notes (Signed)
Patient and lab staff wore face masks for visit per COVID-19 protocol. -DMG 

## 2018-07-09 ENCOUNTER — Telehealth: Payer: Self-pay

## 2018-07-09 ENCOUNTER — Encounter: Payer: Self-pay | Admitting: Family Medicine

## 2018-07-09 ENCOUNTER — Ambulatory Visit (INDEPENDENT_AMBULATORY_CARE_PROVIDER_SITE_OTHER): Payer: BLUE CROSS/BLUE SHIELD | Admitting: Family Medicine

## 2018-07-09 VITALS — Wt 140.0 lb

## 2018-07-09 DIAGNOSIS — R109 Unspecified abdominal pain: Secondary | ICD-10-CM | POA: Diagnosis not present

## 2018-07-09 DIAGNOSIS — N200 Calculus of kidney: Secondary | ICD-10-CM

## 2018-07-09 DIAGNOSIS — R319 Hematuria, unspecified: Secondary | ICD-10-CM | POA: Insufficient documentation

## 2018-07-09 DIAGNOSIS — Z87442 Personal history of urinary calculi: Secondary | ICD-10-CM | POA: Diagnosis not present

## 2018-07-09 DIAGNOSIS — R3129 Other microscopic hematuria: Secondary | ICD-10-CM

## 2018-07-09 LAB — URINE CULTURE
MICRO NUMBER:: 462394
SPECIMEN QUALITY:: ADEQUATE

## 2018-07-09 NOTE — Assessment & Plan Note (Signed)
>  15 minutes spent in face to face time with patient, >50% spent in counselling or coordination of care- hematuria, flank pain, nausea- very consistent with nephrolithiasis in pt with know kidney stones (see CT from 2/20).  Will refer urgently to urology.  She wants to hold off on Flomax at this time and will continue zofran as needed for nausea. Urine cx still pending- we will call her with these results. The patient indicates understanding of these issues and agrees with the plan.

## 2018-07-09 NOTE — Telephone Encounter (Signed)
LMOVM for pt to call office/need to go through the check in process with her for her 9:40am appt with Dr. Dayton Martes today/thx dmf

## 2018-07-09 NOTE — Progress Notes (Signed)
Virtual Visit via Video   Due to the COVID-19 pandemic, this visit was completed with telemedicine (audio/video) technology to reduce patient and provider exposure as well as to preserve personal protective equipment.   I connected with [name] by a video enabled telemedicine application and verified that I am speaking with the correct person using two identifiers. Location patient: Home Location provider: [facility], Office Persons participating in the virtual visit: [name], Ruthe Mannanalia Antwan Pandya, MD   I discussed the limitations of evaluation and management by telemedicine and the availability of in person appointments. The patient expressed understanding and agreed to proceed.  Care Team   Patient Care Team: Dianne DunAron, Tala Eber M, MD as PCP - General (Family Medicine)  Subjective:   HPI:  ?UTI-  About 2-weeks-ago she started having problems with urinary incontinence. Then on Sunday started with left sided flank pain that is worse than on right side.  Came in yesterday for UA and cx- UA pos for RBCs only, otherwise negative.  Urine cx pending.  Not having any dysuria or fever.  She is having nausea- we sent in zofran for her yesterday which is helping.  She does have a h/o kidney stones.  Renal stone CT from 04/08/18 showed the following:  There is a calculus in the right ureter at the level of S1 measuring 4 x 3 mm. Slightly more distally, there is a 1 mm calculus. Slightly more distally, there is a 2 mm calculus with an adjacent 1 mm calculus in a third calculus measuring 4 x 3 mm. No left-sided ureteral calculi are evident. The urinary bladder is midline with wall thickness within normal limits.  She was supposed to see a Insurance underwriterUrologist but has not done this yet. She will need a referral. She does not recall passing any stones since imaging was done.  Review of Systems  Constitutional: Negative for chills and fever.  HENT: Negative.   Gastrointestinal: Positive  for nausea.  Genitourinary: Positive for flank pain, frequency and urgency. Negative for dysuria and hematuria.  Skin: Negative.   All other systems reviewed and are negative.    Patient Active Problem List   Diagnosis Date Noted  . History of nephrolithiasis 07/09/2018  . Hematuria 07/09/2018  . Dysfunction of eustachian tube 03/29/2018  . Bruxism 03/29/2018  . Breast pain, right 07/09/2017  . Bacterial infection due to H. pylori 03/19/2017  . Nausea 02/07/2017  . OSA (obstructive sleep apnea) 05/29/2016  . Hypersomnia 04/06/2016  . Lumbar degenerative disc disease 05/07/2015  . Other mixed anxiety disorders 08/28/2014  . S/P laparoscopic cholecystectomy 07/05/2011  . CERVICAL STRAIN 05/19/2009  . Allergic rhinitis 06/22/2008  . Migraine 06/22/2008  . DEPRESSION, MAJOR, MODERATE 06/15/2008  . PARESTHESIA 06/15/2008  . PULMONARY NODULE 04/17/2007  . MENORRHAGIA 07/02/2006  . NOCTURIA 04/26/2006    Social History   Tobacco Use  . Smoking status: Never Smoker  . Smokeless tobacco: Never Used  Substance Use Topics  . Alcohol use: Yes    Comment: occasionally, 1/month    Current Outpatient Medications:  .  cetirizine-pseudoephedrine (ZYRTEC-D ALLERGY & CONGESTION) 5-120 MG tablet, Take 1 tablet by mouth 2 (two) times daily., Disp: 30 tablet, Rfl: 0 .  linaclotide (LINZESS) 72 MCG capsule, Take 1 capsule (72 mcg total) by mouth daily before breakfast., Disp: 30 capsule, Rfl: 11 .  ondansetron (ZOFRAN) 4 MG tablet, Take 1 tablet (4 mg total) by mouth every 8 (eight) hours as needed for nausea or vomiting., Disp: 10  tablet, Rfl: 0 .  topiramate (TOPAMAX) 100 MG tablet, TAKE 2 TABLETS AT BEDTIME, Disp: 180 tablet, Rfl: 4 .  dihydroergotamine (MIGRANAL) 4 MG/ML nasal spray, Place 1 spray into the nose as needed for migraine. Use in one nostril as directed.  No more than 4 sprays in one hour (Patient not taking: Reported on 07/09/2018), Disp: 2 mL, Rfl: 0 .  Fremanezumab-vfrm  (AJOVY) 225 MG/1.5ML SOSY, Inject 225 mg into the skin every 30 (thirty) days. (Patient not taking: Reported on 07/09/2018), Disp: 1 Syringe, Rfl: 11  Allergies  Allergen Reactions  . Bactrim [Sulfamethoxazole-Trimethoprim] Rash and Other (See Comments)    Pt states she developed a rash on arms, throat soreness, headache and ear pain.     Objective:  Wt 140 lb (63.5 kg)   BMI 25.61 kg/m    VITALS: Per patient if applicable, see vitals. GENERAL: Alert, appears well and in no acute distress. HEENT: Atraumatic, conjunctiva clear, no obvious abnormalities on inspection of external nose and ears. NECK: Normal movements of the head and neck. CARDIOPULMONARY: No increased WOB. Speaking in clear sentences. I:E ratio WNL.  MS: Moves all visible extremities without noticeable abnormality. PSYCH: Pleasant and cooperative, well-groomed. Speech normal rate and rhythm. Affect is appropriate. Insight and judgement are appropriate. Attention is focused, linear, and appropriate.  NEURO: CN grossly intact. Oriented as arrived to appointment on time with no prompting. Moves both UE equally.  SKIN: No obvious lesions, wounds, erythema, or cyanosis noted on face or hands.  Depression screen [facility] 2/9 06/11/2017 02/07/2017 12/20/2015  Decreased Interest 2 2 0  Down, Depressed, Hopeless 1 3 0  PHQ - 2 Score 3 5 0  Altered sleeping 3 3 -  Tired, decreased energy 3 3 -  Change in appetite 1 2 -  Feeling bad or failure about yourself  1 2 -  Trouble concentrating 1 2 -  Moving slowly or fidgety/restless 0 0 -  Suicidal thoughts 0 0 -  PHQ-9 Score 12 17 -  Difficult doing work/chores Somewhat difficult Very difficult -    Assessment and Plan:   [name] was seen today for dysuria.  Diagnoses and all orders for this visit:  Flank pain -     Ambulatory referral to Urology  Nephrolithiasis -     Ambulatory referral to Urology  History of nephrolithiasis  Other microscopic hematuria    . COVID-19  Education: The signs and symptoms of COVID-19 were discussed with the patient and how to seek care for testing if needed. The importance of social distancing was discussed today. . Reviewed expectations re: course of current medical issues. . Discussed self-management of symptoms. . Outlined signs and symptoms indicating need for more acute intervention. . Patient verbalized understanding and all questions were answered. Marland Kitchen Health Maintenance issues including appropriate healthy diet, exercise, and smoking avoidance were discussed with patient. . See orders for this visit as documented in the electronic medical record.  Ruthe Mannan, MD  Records requested if needed. Time spent: 15 minutes, of which >50% was spent in obtaining information about her symptoms, reviewing her previous labs, evaluations, and treatments, counseling her about her condition (please see the discussed topics above), and developing a plan to further investigate it; she had a number of questions which I addressed.

## 2018-07-12 DIAGNOSIS — R110 Nausea: Secondary | ICD-10-CM | POA: Diagnosis not present

## 2018-07-12 DIAGNOSIS — Z87442 Personal history of urinary calculi: Secondary | ICD-10-CM | POA: Diagnosis not present

## 2018-07-12 DIAGNOSIS — R1084 Generalized abdominal pain: Secondary | ICD-10-CM | POA: Diagnosis not present

## 2018-07-12 DIAGNOSIS — R310 Gross hematuria: Secondary | ICD-10-CM | POA: Diagnosis not present

## 2018-07-29 ENCOUNTER — Ambulatory Visit: Payer: BLUE CROSS/BLUE SHIELD | Admitting: Family Medicine

## 2018-07-29 ENCOUNTER — Other Ambulatory Visit (INDEPENDENT_AMBULATORY_CARE_PROVIDER_SITE_OTHER): Payer: BLUE CROSS/BLUE SHIELD

## 2018-07-29 ENCOUNTER — Other Ambulatory Visit: Payer: Self-pay | Admitting: Family Medicine

## 2018-07-29 DIAGNOSIS — R251 Tremor, unspecified: Secondary | ICD-10-CM

## 2018-07-29 NOTE — Progress Notes (Unsigned)
Virtual Visit via Video   Due to the COVID-19 pandemic, this visit was completed with telemedicine (audio/video) technology to reduce patient and provider exposure as well as to preserve personal protective equipment.   I connected with Magdalen Spatzammy Moore Warchol by a video enabled telemedicine application and verified that I am speaking with the correct person using two identifiers. Location patient: Home Location provider: [facility], Office Persons participating in the virtual visit: Ilia Rolm BookbinderMoore Stanhope, Ruthe Mannanalia Iasiah Ozment, MD   I discussed the limitations of evaluation and management by telemedicine and the availability of in person appointments. The patient expressed understanding and agreed to proceed.  Care Team   Patient Care Team: Dianne DunAron, Batya Citron M, MD as PCP - General (Family Medicine)  Subjective:   HPI:  She is concerned about possible hypoglycemia.  ROS   Patient Active Problem List   Diagnosis Date Noted  . History of nephrolithiasis 07/09/2018  . Hematuria 07/09/2018  . Dysfunction of eustachian tube 03/29/2018  . Bruxism 03/29/2018  . Breast pain, right 07/09/2017  . Bacterial infection due to H. pylori 03/19/2017  . Nausea 02/07/2017  . OSA (obstructive sleep apnea) 05/29/2016  . Hypersomnia 04/06/2016  . Lumbar degenerative disc disease 05/07/2015  . Other mixed anxiety disorders 08/28/2014  . S/P laparoscopic cholecystectomy 07/05/2011  . CERVICAL STRAIN 05/19/2009  . Allergic rhinitis 06/22/2008  . Migraine 06/22/2008  . DEPRESSION, MAJOR, MODERATE 06/15/2008  . PARESTHESIA 06/15/2008  . PULMONARY NODULE 04/17/2007  . MENORRHAGIA 07/02/2006  . NOCTURIA 04/26/2006    Social History   Tobacco Use  . Smoking status: Never Smoker  . Smokeless tobacco: Never Used  Substance Use Topics  . Alcohol use: Yes    Comment: occasionally, 1/month    Current Outpatient Medications:  .  dihydroergotamine (MIGRANAL) 4 MG/ML nasal spray, Place 1 spray into the nose as  needed for migraine. Use in one nostril as directed.  No more than 4 sprays in one hour, Disp: 2 mL, Rfl: 0 .  Fremanezumab-vfrm (AJOVY) 225 MG/1.5ML SOSY, Inject 225 mg into the skin every 30 (thirty) days., Disp: 1 Syringe, Rfl: 11 .  linaclotide (LINZESS) 72 MCG capsule, Take 1 capsule (72 mcg total) by mouth daily before breakfast., Disp: 30 capsule, Rfl: 11 .  ondansetron (ZOFRAN) 4 MG tablet, Take 1 tablet (4 mg total) by mouth every 8 (eight) hours as needed for nausea or vomiting., Disp: 10 tablet, Rfl: 0 .  topiramate (TOPAMAX) 100 MG tablet, TAKE 2 TABLETS AT BEDTIME, Disp: 180 tablet, Rfl: 4 .  cetirizine-pseudoephedrine (ZYRTEC-D ALLERGY & CONGESTION) 5-120 MG tablet, Take 1 tablet by mouth 2 (two) times daily. (Patient not taking: Reported on 07/29/2018), Disp: 30 tablet, Rfl: 0  Allergies  Allergen Reactions  . Bactrim [Sulfamethoxazole-Trimethoprim] Rash and Other (See Comments)    Pt states she developed a rash on arms, throat soreness, headache and ear pain.     Objective:   VITALS: Per patient if applicable, see vitals. GENERAL: Alert, appears well and in no acute distress. HEENT: Atraumatic, conjunctiva clear, no obvious abnormalities on inspection of external nose and ears. NECK: Normal movements of the head and neck. CARDIOPULMONARY: No increased WOB. Speaking in clear sentences. I:E ratio WNL.  MS: Moves all visible extremities without noticeable abnormality. PSYCH: Pleasant and cooperative, well-groomed. Speech normal rate and rhythm. Affect is appropriate. Insight and judgement are appropriate. Attention is focused, linear, and appropriate.  NEURO: CN grossly intact. Oriented as arrived to appointment on time with no prompting. Moves both  UE equally.  SKIN: No obvious lesions, wounds, erythema, or cyanosis noted on face or hands.  Depression screen [facility] 2/9 06/11/2017 02/07/2017 12/20/2015  Decreased Interest 2 2 0  Down, Depressed, Hopeless 1 3 0  PHQ - 2 Score 3 5 0   Altered sleeping 3 3 -  Tired, decreased energy 3 3 -  Change in appetite 1 2 -  Feeling bad or failure about yourself  1 2 -  Trouble concentrating 1 2 -  Moving slowly or fidgety/restless 0 0 -  Suicidal thoughts 0 0 -  PHQ-9 Score 12 17 -  Difficult doing work/chores Somewhat difficult Very difficult -    Assessment and Plan:   There are no diagnoses linked to this encounter.  Marland Kitchen COVID-19 Education: The signs and symptoms of COVID-19 were discussed with the patient and how to seek care for testing if needed. The importance of social distancing was discussed today. . Reviewed expectations re: course of current medical issues. . Discussed self-management of symptoms. . Outlined signs and symptoms indicating need for more acute intervention. . Patient verbalized understanding and all questions were answered. Marland Kitchen Health Maintenance issues including appropriate healthy diet, exercise, and smoking avoidance were discussed with patient. . See orders for this visit as documented in the electronic medical record.  Ruthe Mannan, MD  Records requested if needed. Time spent: *** minutes, of which >50% was spent in obtaining information about her symptoms, reviewing her previous labs, evaluations, and treatments, counseling her about her condition (please see the discussed topics above), and developing a plan to further investigate it; she had a number of questions which I addressed.

## 2018-07-30 LAB — CBC WITH DIFFERENTIAL/PLATELET
Absolute Monocytes: 573 cells/uL (ref 200–950)
Basophils Absolute: 58 cells/uL (ref 0–200)
Basophils Relative: 0.7 %
Eosinophils Absolute: 423 cells/uL (ref 15–500)
Eosinophils Relative: 5.1 %
HCT: 43.9 % (ref 35.0–45.0)
Hemoglobin: 15.1 g/dL (ref 11.7–15.5)
Lymphs Abs: 2830 cells/uL (ref 850–3900)
MCH: 32.8 pg (ref 27.0–33.0)
MCHC: 34.4 g/dL (ref 32.0–36.0)
MCV: 95.2 fL (ref 80.0–100.0)
MPV: 10.9 fL (ref 7.5–12.5)
Monocytes Relative: 6.9 %
Neutro Abs: 4416 cells/uL (ref 1500–7800)
Neutrophils Relative %: 53.2 %
Platelets: 210 10*3/uL (ref 140–400)
RBC: 4.61 10*6/uL (ref 3.80–5.10)
RDW: 12.3 % (ref 11.0–15.0)
Total Lymphocyte: 34.1 %
WBC: 8.3 10*3/uL (ref 3.8–10.8)

## 2018-07-30 LAB — COMPREHENSIVE METABOLIC PANEL
AG Ratio: 1.5 (calc) (ref 1.0–2.5)
ALT: 18 U/L (ref 6–29)
AST: 20 U/L (ref 10–35)
Albumin: 4.1 g/dL (ref 3.6–5.1)
Alkaline phosphatase (APISO): 45 U/L (ref 37–153)
BUN: 15 mg/dL (ref 7–25)
CO2: 26 mmol/L (ref 20–32)
Calcium: 10.2 mg/dL (ref 8.6–10.4)
Chloride: 105 mmol/L (ref 98–110)
Creat: 0.59 mg/dL (ref 0.50–1.05)
Globulin: 2.7 g/dL (calc) (ref 1.9–3.7)
Glucose, Bld: 81 mg/dL (ref 65–99)
Potassium: 3.8 mmol/L (ref 3.5–5.3)
Sodium: 138 mmol/L (ref 135–146)
Total Bilirubin: 0.4 mg/dL (ref 0.2–1.2)
Total Protein: 6.8 g/dL (ref 6.1–8.1)

## 2018-07-30 LAB — HEMOGLOBIN A1C
Hgb A1c MFr Bld: 5 % of total Hgb (ref <5.7)
Mean Plasma Glucose: 97 (calc)
eAG (mmol/L): 5.4 (calc)

## 2018-07-30 LAB — T4, FREE: Free T4: 1.2 ng/dL (ref 0.8–1.8)

## 2018-07-30 LAB — TSH: TSH: 1.1 mIU/L

## 2018-07-30 LAB — FERRITIN: Ferritin: 68 ng/mL (ref 16–232)

## 2018-07-31 ENCOUNTER — Ambulatory Visit: Payer: BLUE CROSS/BLUE SHIELD | Admitting: Family Medicine

## 2018-08-01 DIAGNOSIS — R3121 Asymptomatic microscopic hematuria: Secondary | ICD-10-CM | POA: Diagnosis not present

## 2018-08-04 NOTE — Progress Notes (Signed)
Virtual Visit via Video   Due to the COVID-19 pandemic, this visit was completed with telemedicine (audio/video) technology to reduce patient and provider exposure as well as to preserve personal protective equipment.   I connected with [name] by a video enabled telemedicine application and verified that I am speaking with the correct person using two identifiers. Location patient: Home Location provider: [facility], Office Persons participating in the virtual visit: [name], Roberta Mannanalia Demetrias Goodbar, MD   I discussed the limitations of evaluation and management by telemedicine and the availability of in person appointments. The patient expressed understanding and agreed to proceed.  Care Team   Patient Care Team: Dianne DunAron, Brantley Wiley M, MD as PCP - General (Family Medicine)  Subjective:   HPI:   Patient had been experiencing shakiness- last time it happened was 2 Fridays ago. Ate a couple of skittles and chocolate, she felt tremendously better.  Has had several other episodes over the past year that occur when she has not been eating for a while.  She wondered if her blood sugar was off.  She does have a family history of DM. We drew labs including CMET, TSH, FT4, CBC, ferriin and a1c.  Recent Results (from the past 2160 hour(s))  POCT Urinalysis Dipstick     Status: Abnormal   Collection Time: 07/08/18  2:42 PM  Result Value Ref Range   Color, UA yellow    Clarity, UA clear    Glucose, UA Negative Negative   Bilirubin, UA negative    Ketones, UA 1+     Comment: 15 mg/dL   Spec Grav, UA 1.6101.020 9.6041.010 - 1.025   Blood, UA 2+     Comment: 80 Ery/uL   pH, UA 6.0 5.0 - 8.0   Protein, UA Negative Negative   Urobilinogen, UA 0.2 0.2 or 1.0 E.U./dL   Nitrite, UA negative    Leukocytes, UA Negative Negative   Appearance     Odor    Urine Culture     Status: None   Collection Time: 07/08/18  2:42 PM  Result Value Ref Range   MICRO NUMBER: [id number]    SPECIMEN QUALITY:  Adequate    Sample Source URINE    STATUS: FINAL    Result:      Single organism less than 10,000 CFU/mL isolated. These organisms, commonly found on external and internal genitalia, are considered colonizers. No further testing performed.  Hemoglobin A1c     Status: None   Collection Time: 07/29/18  2:23 PM  Result Value Ref Range   Hgb A1c MFr Bld 5.0 <5.7 % of total Hgb    Comment: For the purpose of screening for the presence of diabetes: . <5.7%       Consistent with the absence of diabetes 5.7-6.4%    Consistent with increased risk for diabetes             (prediabetes) > or =6.5%  Consistent with diabetes . This assay result is consistent with a decreased risk of diabetes. . Currently, no consensus exists regarding use of hemoglobin A1c for diagnosis of diabetes in children. . According to American Diabetes Association (ADA) guidelines, hemoglobin A1c <7.0% represents optimal control in non-pregnant diabetic patients. Different metrics may apply to specific patient populations.  Standards of Medical Care in Diabetes(ADA). .    Mean Plasma Glucose 97 (calc)   eAG (mmol/L) 5.4 (calc)  Ferritin     Status: None   Collection Time: 07/29/18  2:23  PM  Result Value Ref Range   Ferritin 68 16 - 232 ng/mL  CBC with Differential/Platelet     Status: None   Collection Time: 07/29/18  2:23 PM  Result Value Ref Range   WBC 8.3 3.8 - 10.8 Thousand/uL   RBC 4.61 3.80 - 5.10 Million/uL   Hemoglobin 15.1 11.7 - 15.5 g/dL   HCT 16.143.9 09.635.0 - 04.545.0 %   MCV 95.2 80.0 - 100.0 fL   MCH 32.8 27.0 - 33.0 pg   MCHC 34.4 32.0 - 36.0 g/dL   RDW 40.912.3 81.111.0 - 91.415.0 %   Platelets 210 140 - 400 Thousand/uL   MPV 10.9 7.5 - 12.5 fL   Neutro Abs 4,416 1,500 - 7,800 cells/uL   Lymphs Abs 2,830 850 - 3,900 cells/uL   Absolute Monocytes 573 200 - 950 cells/uL   Eosinophils Absolute 423 15 - 500 cells/uL   Basophils Absolute 58 0 - 200 cells/uL   Neutrophils Relative % 53.2 %   Total Lymphocyte  34.1 %   Monocytes Relative 6.9 %   Eosinophils Relative 5.1 %   Basophils Relative 0.7 %  T4, free     Status: None   Collection Time: 07/29/18  2:23 PM  Result Value Ref Range   Free T4 1.2 0.8 - 1.8 ng/dL  TSH     Status: None   Collection Time: 07/29/18  2:23 PM  Result Value Ref Range   TSH 1.10 mIU/L    Comment:           Reference Range .           > or = 20 Years  0.40-4.50 .                Pregnancy Ranges           First trimester    0.26-2.66           Second trimester   0.55-2.73           Third trimester    0.43-2.91   Comprehensive metabolic panel     Status: None   Collection Time: 07/29/18  2:23 PM  Result Value Ref Range   Glucose, Bld 81 65 - 99 mg/dL    Comment: .            Fasting reference interval .    BUN 15 7 - 25 mg/dL   Creat 7.820.59 9.560.50 - 2.131.05 mg/dL    Comment: For patients >51 years of age, the reference limit for Creatinine is approximately 13% higher for people identified as African-American. .    BUN/Creatinine Ratio NOT APPLICABLE 6 - 22 (calc)   Sodium 138 135 - 146 mmol/L   Potassium 3.8 3.5 - 5.3 mmol/L   Chloride 105 98 - 110 mmol/L   CO2 26 20 - 32 mmol/L   Calcium 10.2 8.6 - 10.4 mg/dL   Total Protein 6.8 6.1 - 8.1 g/dL   Albumin 4.1 3.6 - 5.1 g/dL   Globulin 2.7 1.9 - 3.7 g/dL (calc)   AG Ratio 1.5 1.0 - 2.5 (calc)   Total Bilirubin 0.4 0.2 - 1.2 mg/dL   Alkaline phosphatase (APISO) 45 37 - 153 U/L   AST 20 10 - 35 U/L   ALT 18 6 - 29 U/L    Review of Systems  Constitutional: Negative.   HENT: Negative.   Eyes: Negative.   Respiratory: Negative.   Cardiovascular: Negative.   Gastrointestinal: Negative.   Genitourinary: Negative.  Musculoskeletal: Negative.   Skin: Negative.   Neurological: Positive for weakness. Negative for tingling, tremors, sensory change, speech change, focal weakness, seizures, loss of consciousness and headaches.       +"shakiness"  Psychiatric/Behavioral: Negative.   All other systems  reviewed and are negative.    Patient Active Problem List   Diagnosis Date Noted  . Shakiness 08/05/2018  . Family history of diabetes mellitus (DM) 08/05/2018  . History of nephrolithiasis 07/09/2018  . Hematuria 07/09/2018  . Dysfunction of eustachian tube 03/29/2018  . Bruxism 03/29/2018  . Breast pain, right 07/09/2017  . Bacterial infection due to H. pylori 03/19/2017  . Nausea 02/07/2017  . OSA (obstructive sleep apnea) 05/29/2016  . Hypersomnia 04/06/2016  . Lumbar degenerative disc disease 05/07/2015  . Other mixed anxiety disorders 08/28/2014  . S/P laparoscopic cholecystectomy 07/05/2011  . CERVICAL STRAIN 05/19/2009  . Allergic rhinitis 06/22/2008  . Migraine 06/22/2008  . DEPRESSION, MAJOR, MODERATE 06/15/2008  . PARESTHESIA 06/15/2008  . PULMONARY NODULE 04/17/2007  . MENORRHAGIA 07/02/2006  . NOCTURIA 04/26/2006    Social History   Tobacco Use  . Smoking status: Never Smoker  . Smokeless tobacco: Never Used  Substance Use Topics  . Alcohol use: Yes    Comment: occasionally, 1/month    Current Outpatient Medications:  .  Fremanezumab-vfrm (AJOVY) 225 MG/1.5ML SOSY, Inject 225 mg into the skin every 30 (thirty) days., Disp: 1 Syringe, Rfl: 11 .  linaclotide (LINZESS) 72 MCG capsule, Take 1 capsule (72 mcg total) by mouth daily before breakfast., Disp: 30 capsule, Rfl: 11 .  ondansetron (ZOFRAN) 4 MG tablet, Take 1 tablet (4 mg total) by mouth every 8 (eight) hours as needed for nausea or vomiting., Disp: 10 tablet, Rfl: 0 .  topiramate (TOPAMAX) 100 MG tablet, TAKE 2 TABLETS AT BEDTIME, Disp: 180 tablet, Rfl: 4 .  cetirizine-pseudoephedrine (ZYRTEC-D ALLERGY & CONGESTION) 5-120 MG tablet, Take 1 tablet by mouth 2 (two) times daily. (Patient not taking: Reported on 07/29/2018), Disp: 30 tablet, Rfl: 0 .  dihydroergotamine (MIGRANAL) 4 MG/ML nasal spray, Place 1 spray into the nose as needed for migraine. Use in one nostril as directed.  No more than 4 sprays in  one hour (Patient not taking: Reported on 08/05/2018), Disp: 2 mL, Rfl: 0  Allergies  Allergen Reactions  . Bactrim [Sulfamethoxazole-Trimethoprim] Rash and Other (See Comments)    Pt states she developed a rash on arms, throat soreness, headache and ear pain.     Objective:  Wt 136 lb (61.7 kg)   BMI 24.87 kg/m   VITALS: Per patient if applicable, see vitals. GENERAL: Alert, appears well and in no acute distress. HEENT: Atraumatic, conjunctiva clear, no obvious abnormalities on inspection of external nose and ears. NECK: Normal movements of the head and neck. CARDIOPULMONARY: No increased WOB. Speaking in clear sentences. I:E ratio WNL.  MS: Moves all visible extremities without noticeable abnormality. PSYCH: Pleasant and cooperative, well-groomed. Speech normal rate and rhythm. Affect is appropriate. Insight and judgement are appropriate. Attention is focused, linear, and appropriate.  NEURO: CN grossly intact. Oriented as arrived to appointment on time with no prompting. Moves both UE equally.  SKIN: No obvious lesions, wounds, erythema, or cyanosis noted on face or hands.  Depression screen [facility] 2/9 06/11/2017 02/07/2017 12/20/2015  Decreased Interest 2 2 0  Down, Depressed, Hopeless 1 3 0  PHQ - 2 Score 3 5 0  Altered sleeping 3 3 -  Tired, decreased energy 3 3 -  Change in  appetite 1 2 -  Feeling bad or failure about yourself  1 2 -  Trouble concentrating 1 2 -  Moving slowly or fidgety/restless 0 0 -  Suicidal thoughts 0 0 -  PHQ-9 Score 12 17 -  Difficult doing work/chores Somewhat difficult Very difficult -    Assessment and Plan:   Mikailah was seen today for follow-up.  Diagnoses and all orders for this visit:  Shakiness  Family history of diabetes mellitus (DM)    . COVID-19 Education: The signs and symptoms of COVID-19 were discussed with the patient and how to seek care for testing if needed. The importance of social distancing was discussed today. . Reviewed  expectations re: course of current medical issues. . Discussed self-management of symptoms. . Outlined signs and symptoms indicating need for more acute intervention. . Patient verbalized understanding and all questions were answered. Marland Kitchen Health Maintenance issues including appropriate healthy diet, exercise, and smoking avoidance were discussed with patient. . See orders for this visit as documented in the electronic medical record.  Arnette Norris, MD  Records requested if needed. Time spent: 15 minutes, of which >50% was spent in obtaining information about her symptoms, reviewing her previous labs, evaluations, and treatments, counseling her about her condition (please see the discussed topics above), and developing a plan to further investigate it; she had a number of questions which I addressed.

## 2018-08-05 ENCOUNTER — Encounter: Payer: Self-pay | Admitting: Family Medicine

## 2018-08-05 ENCOUNTER — Ambulatory Visit (INDEPENDENT_AMBULATORY_CARE_PROVIDER_SITE_OTHER): Payer: BC Managed Care – PPO | Admitting: Family Medicine

## 2018-08-05 VITALS — Wt 136.0 lb

## 2018-08-05 DIAGNOSIS — Z833 Family history of diabetes mellitus: Secondary | ICD-10-CM | POA: Diagnosis not present

## 2018-08-05 DIAGNOSIS — R251 Tremor, unspecified: Secondary | ICD-10-CM | POA: Diagnosis not present

## 2018-08-05 NOTE — Assessment & Plan Note (Signed)
>  15 minutes spent in face to face time with patient, >50% spent in counselling or coordination of care discussing shakiness with patient.  Seems to resolve quickly when she eats something sugary.  Her labs are all very reassuring.  a1c is only 5.0.  I discussed her labs with her and advised that she eat small meals/take snacks to work as she is likely just sensitive to hypoglycemia when she goes long periods of time without eating. The patient indicates understanding of these issues and agrees with the plan. Call or send my chart message prn if these symptoms worsen or fail to improve as anticipated.

## 2018-08-26 ENCOUNTER — Encounter: Payer: Self-pay | Admitting: Family Medicine

## 2018-08-26 ENCOUNTER — Telehealth: Payer: Self-pay | Admitting: Family Medicine

## 2018-08-26 ENCOUNTER — Telehealth: Payer: Self-pay

## 2018-08-26 ENCOUNTER — Ambulatory Visit (INDEPENDENT_AMBULATORY_CARE_PROVIDER_SITE_OTHER): Payer: BC Managed Care – PPO | Admitting: Family Medicine

## 2018-08-26 VITALS — Temp 98.5°F

## 2018-08-26 DIAGNOSIS — Z8619 Personal history of other infectious and parasitic diseases: Secondary | ICD-10-CM | POA: Diagnosis not present

## 2018-08-26 DIAGNOSIS — R110 Nausea: Secondary | ICD-10-CM | POA: Diagnosis not present

## 2018-08-26 DIAGNOSIS — Z8719 Personal history of other diseases of the digestive system: Secondary | ICD-10-CM | POA: Diagnosis not present

## 2018-08-26 NOTE — Progress Notes (Signed)
Virtual Visit via Video   Due to the COVID-19 pandemic, this visit was completed with telemedicine (audio/video) technology to reduce patient and provider exposure as well as to preserve personal protective equipment.   I connected with [name] by a video enabled telemedicine application and verified that I am speaking with the correct person using two identifiers. Location patient: Home Location provider: [facility], Office Persons participating in the virtual visit: [name], Roberta Mannanalia Sherian Valenza, MD   I discussed the limitations of evaluation and management by telemedicine and the availability of in person appointments. The patient expressed understanding and agreed to proceed.  Care Team   Patient Care Team: [name], Roberta Claytor M, MD as PCP - General (Family Medicine)  Subjective:   HPI:   Nausea-chronic issue- h/o intermittent epigastric pain and nausea followed by GI (Dr. Rhea [name])  but this weekend it was constant and actually threw up once on Sunday.  Zofran helps for a few hours.  Dr. Rhea [name] prescribed also reglan but she is not sure why she stopped it.  She also takes linzess for constipation.  Saw Dr. Rhea [name] and had a an endoscopy on 04/06/17- reviewed. Showed duodenal ulcer.  She was placed on protonix but stopped taking it ( she is not sure why).  Also had a CT of abdomen and pelvis done on 04/08/18, which I have also reviewed and showed the following impression:  IMPRESSION: 1. There are several calculi in the right ureter ranging in size from as small as 1 mm to as large as 4 mm without appreciable hydronephrosis. These calculi are seen in the right ureter at the upper to mid sacral levels. No intrarenal calculi identified on either side. No left-sided left ureteral calculus.  2. No evident bowel obstruction. No abscess in the abdomen pelvis. No periappendiceal region inflammation.  3. Gallbladder absent. Mild prominence of the common bile duct is noted  with tapering distally. No biliary duct mass or calculus is appreciable by CT.  4.  Aortoiliac atherosclerosis.  She does have a H pylori in 01/2017 which was treated.  She has not noticed any black stools. Has had some epigastric pain.  No fevers.  Remote h/o lap cholecystectomy.  Review of Systems  Constitutional: Positive for malaise/fatigue and weight loss. Negative for chills and fever.  HENT: Negative.   Respiratory: Negative.   Cardiovascular: Negative.   Gastrointestinal: Positive for abdominal pain, constipation, diarrhea, heartburn, nausea and vomiting. Negative for blood in stool and melena.  Genitourinary: Negative.   Skin: Negative.   Neurological: Negative.   Endo/Heme/Allergies: Negative.   Psychiatric/Behavioral: Negative.   All other systems reviewed and are negative.    Patient Active Problem List   Diagnosis Date Noted  . History of Helicobacter pylori infection 08/26/2018  . Family history of diabetes mellitus (DM) 08/05/2018  . History of nephrolithiasis 07/09/2018  . Hematuria 07/09/2018  . Dysfunction of eustachian tube 03/29/2018  . Bruxism 03/29/2018  . Breast pain, right 07/09/2017  . Bacterial infection due to H. pylori 03/19/2017  . Nausea 02/07/2017  . OSA (obstructive sleep apnea) 05/29/2016  . Hypersomnia 04/06/2016  . Lumbar degenerative disc disease 05/07/2015  . Other mixed anxiety disorders 08/28/2014  . S/P laparoscopic cholecystectomy 07/05/2011  . CERVICAL STRAIN 05/19/2009  . Allergic rhinitis 06/22/2008  . Migraine 06/22/2008  . DEPRESSION, MAJOR, MODERATE 06/15/2008  . PARESTHESIA 06/15/2008  . PULMONARY NODULE 04/17/2007  . MENORRHAGIA 07/02/2006  . NOCTURIA 04/26/2006    Social History   Tobacco  Use  . Smoking status: Never Smoker  . Smokeless tobacco: Never Used  Substance Use Topics  . Alcohol use: Yes    Comment: occasionally, 1/month    Current Outpatient Medications:  .  Fremanezumab-vfrm (AJOVY) 225  MG/1.5ML SOSY, Inject 225 mg into the skin every 30 (thirty) days., Disp: 1 Syringe, Rfl: 11 .  linaclotide (LINZESS) 72 MCG capsule, Take 1 capsule (72 mcg total) by mouth daily before breakfast., Disp: 30 capsule, Rfl: 11 .  ondansetron (ZOFRAN) 4 MG tablet, Take 1 tablet (4 mg total) by mouth every 8 (eight) hours as needed for nausea or vomiting., Disp: 10 tablet, Rfl: 0 .  topiramate (TOPAMAX) 100 MG tablet, TAKE 2 TABLETS AT BEDTIME, Disp: 180 tablet, Rfl: 4  Allergies  Allergen Reactions  . Bactrim [Sulfamethoxazole-Trimethoprim] Rash and Other (See Comments)    Pt states she developed a rash on arms, throat soreness, headache and ear pain.     Objective:  Temp 98.5 F (36.9 C) (Oral)   VITALS: Per patient if applicable, see vitals. GENERAL: Alert, appears well and in no acute distress. HEENT: Atraumatic, conjunctiva clear, no obvious abnormalities on inspection of external nose and ears. NECK: Normal movements of the head and neck. CARDIOPULMONARY: No increased WOB. Speaking in clear sentences. I:E ratio WNL.  MS: Moves all visible extremities without noticeable abnormality. ABD:  Pt can reproduce pain when pressing on epigastric area, no rebound pain. PSYCH: Pleasant and cooperative, well-groomed. Speech normal rate and rhythm. Affect is appropriate. Insight and judgement are appropriate. Attention is focused, linear, and appropriate.  NEURO: CN grossly intact. Oriented as arrived to appointment on time with no prompting. Moves both UE equally.  SKIN: No obvious lesions, wounds, erythema, or cyanosis noted on face or hands.  Depression screen [facility] 2/9 06/11/2017 02/07/2017 12/20/2015  Decreased Interest 2 2 0  Down, Depressed, Hopeless 1 3 0  PHQ - 2 Score 3 5 0  Altered sleeping 3 3 -  Tired, decreased energy 3 3 -  Change in appetite 1 2 -  Feeling bad or failure about yourself  1 2 -  Trouble concentrating 1 2 -  Moving slowly or fidgety/restless 0 0 -  Suicidal thoughts  0 0 -  PHQ-9 Score 12 17 -  Difficult doing work/chores Somewhat difficult Very difficult -    Assessment and Plan:   [name] was seen today for nausea.  Diagnoses and all orders for this visit:  Nausea  History of Helicobacter pylori infection -     Helicobacter pylori special antigen    . COVID-19 Education: The signs and symptoms of COVID-19 were discussed with the patient and how to seek care for testing if needed. The importance of social distancing was discussed today. . Reviewed expectations re: course of current medical issues. . Discussed self-management of symptoms. . Outlined signs and symptoms indicating need for more acute intervention. . Patient verbalized understanding and all questions were answered. Marland Kitchen Health Maintenance issues including appropriate healthy diet, exercise, and smoking avoidance were discussed with patient. . See orders for this visit as documented in the electronic medical record.  Arnette Norris, MD  Records requested if needed. Time spent: 40 minutes, of which >50% was spent in obtaining information about her symptoms, reviewing her previous labs, evaluations, and treatments, counseling her about her condition (please see the discussed topics above), and developing a plan to further investigate it; she had a number of questions which I addressed.

## 2018-08-26 NOTE — Telephone Encounter (Signed)
Questions for Screening COVID-19  Symptom onset: None  Travel or Contacts: None  During this illness, did/does the patient experience any of the following symptoms? Fever >100.60F []   Yes [x]   No []   Unknown Subjective fever (felt feverish) []   Yes [x]   No []   Unknown Chills []   Yes [x]   No []   Unknown Muscle aches (myalgia) []   Yes [x]   No []   Unknown Runny nose (rhinorrhea) []   Yes [x]   No []   Unknown Sore throat []   Yes [x]   No []   Unknown Cough (new onset or worsening of chronic cough) []   Yes [x]   No []   Unknown Shortness of breath (dyspnea) []   Yes [x]   No []   Unknown Nausea or vomiting [x]   Yes []   No []   Unknown Headache []   Yes [x]   No []   Unknown Abdominal pain  []   Yes [x]   No []   Unknown Diarrhea (?3 loose/looser than normal stools/24hr period) []   Yes [x]   No []   Unknown Other, specify:  Patient risk factors: Smoker? []   Current []   Former []   Never If female, currently pregnant? []   Yes []   No  Patient Active Problem List   Diagnosis Date Noted  . History of Helicobacter pylori infection 08/26/2018  . Shakiness 08/05/2018  . Family history of diabetes mellitus (DM) 08/05/2018  . History of nephrolithiasis 07/09/2018  . Hematuria 07/09/2018  . Dysfunction of eustachian tube 03/29/2018  . Bruxism 03/29/2018  . Breast pain, right 07/09/2017  . Bacterial infection due to H. pylori 03/19/2017  . Nausea 02/07/2017  . OSA (obstructive sleep apnea) 05/29/2016  . Hypersomnia 04/06/2016  . Lumbar degenerative disc disease 05/07/2015  . Other mixed anxiety disorders 08/28/2014  . S/P laparoscopic cholecystectomy 07/05/2011  . CERVICAL STRAIN 05/19/2009  . Allergic rhinitis 06/22/2008  . Migraine 06/22/2008  . DEPRESSION, MAJOR, MODERATE 06/15/2008  . PARESTHESIA 06/15/2008  . PULMONARY NODULE 04/17/2007  . MENORRHAGIA 07/02/2006  . NOCTURIA 04/26/2006    Plan:  []   High risk for COVID-19 with red flags go to ED (with CP, SOB, weak/lightheaded, or fever > 101.5).  Call ahead.  []   High risk for COVID-19 but stable. Inform provider and coordinate time for [facility] visit.   []   No red flags but URI signs or symptoms okay for [facility] visit.

## 2018-08-26 NOTE — Telephone Encounter (Signed)
Patient came by with a concer about having trouble breathing. She is thinking that it could come from wearing a mask. She states that at work that if she has a Recruitment consultant note saying that she is allowed to wear a face shield she could wear the face shield instead of the mask. Please advise. [phone].

## 2018-08-26 NOTE — Assessment & Plan Note (Signed)
>  40 minutes spent in face to face time with patient, >50% spent in counselling or coordination of care discussing persistent intermittent nausea in the setting of a patient with known duodenal ulcer, history of h pylori and medication non adherence. I will bring her in for the h pylori stool antigen test.  I advised that she restart protonix AFTER her stool antigen sample is left. And then to call Dr. Hilarie Fredrickson for follow up. The patient indicates understanding of these issues and agrees with the plan.

## 2018-08-26 NOTE — Telephone Encounter (Signed)
Pt brought in stool sample. She advised the sample was "watery, nothing solid"  One of the reject criteria's is watery stool. I informed Selinda Eon F.(Dr Aron's nurse)  this test may not be able to be performed.

## 2018-08-26 NOTE — Telephone Encounter (Signed)
I don't think I can legally approve that.  It would go against the Governor and mayor;s orders.  I'll forward to [facility] to be sure.

## 2018-08-26 NOTE — Telephone Encounter (Signed)
TA-Pt wants a letter stating that she can wear a face shield but not the mask/plz advise/thx dmf

## 2018-08-27 ENCOUNTER — Telehealth: Payer: Self-pay

## 2018-08-27 LAB — HELICOBACTER PYLORI  SPECIAL ANTIGEN
MICRO NUMBER:: 617000
SPECIMEN QUALITY: ADEQUATE

## 2018-08-27 NOTE — Telephone Encounter (Signed)
Copied from [place] [id number]. Topic: General - Other >> Aug 27, 2018  9:53 AM Rainey Pines A wrote: Patient is called to check the status of her getting a note stating that she doesn't have to wear a mask because it restricts her breathing and would like a callback.

## 2018-08-27 NOTE — Telephone Encounter (Signed)
Pt calling to follow up on the request for note to not wear wear a mask, but wear the face shield instead.  Pt states they are ok with a face shield only with a dr note. Pt states she is going to lose her job if she cannot get this note. Pt states she cnnot breathe well with the mask, works at [employer] over a [employer] , and it is hot enough back there as it is.  Pt states her boss got the same note from one of the doctors at [place], and he is allowed to wear shield only.  Please advise

## 2018-08-27 NOTE — Telephone Encounter (Signed)
I have the same understanding as well. Thanks.

## 2018-08-28 NOTE — Telephone Encounter (Signed)
See other phone note

## 2018-08-28 NOTE — Telephone Encounter (Signed)
Edd Arbour, what are your thoughts?  This makes me very uncomfortable as a face shield without a mask isn't truly protective.

## 2018-08-29 NOTE — Telephone Encounter (Signed)
Discussed with pt that this letter cannot be written/thx dmf

## 2018-08-29 NOTE — Telephone Encounter (Signed)
Dr. Deborra Medina - it is to my knowledge that the face shield, in addition to the face mask provides another layer of safety. However, the face shield is primarily used for eye protection.

## 2018-09-04 ENCOUNTER — Other Ambulatory Visit: Payer: BLUE CROSS/BLUE SHIELD

## 2018-09-04 ENCOUNTER — Telehealth: Payer: Self-pay | Admitting: Neurology

## 2018-09-04 NOTE — Telephone Encounter (Signed)
Patient would like a note stating that the face mask  Causes her migraines so she could use a face shield at work

## 2018-09-05 NOTE — Telephone Encounter (Signed)
Note written to be signed.  Will contact patient when ready.

## 2018-09-05 NOTE — Telephone Encounter (Signed)
We can provide a note stating that the face mask triggers her migraines

## 2018-09-06 NOTE — Telephone Encounter (Signed)
Patient aware letter is ready  

## 2018-10-21 ENCOUNTER — Telehealth: Payer: Self-pay | Admitting: Internal Medicine

## 2018-10-25 NOTE — Telephone Encounter (Signed)
Patient never called back, will await further communication from Patient.

## 2018-10-31 ENCOUNTER — Encounter: Payer: Self-pay | Admitting: Physician Assistant

## 2018-10-31 ENCOUNTER — Other Ambulatory Visit: Payer: Self-pay

## 2018-10-31 ENCOUNTER — Ambulatory Visit: Payer: BC Managed Care – PPO | Admitting: Physician Assistant

## 2018-10-31 VITALS — BP 132/84 | HR 85 | Temp 97.0°F | Ht 62.0 in | Wt 127.0 lb

## 2018-10-31 DIAGNOSIS — R1013 Epigastric pain: Secondary | ICD-10-CM

## 2018-10-31 DIAGNOSIS — R110 Nausea: Secondary | ICD-10-CM | POA: Diagnosis not present

## 2018-10-31 DIAGNOSIS — K5909 Other constipation: Secondary | ICD-10-CM

## 2018-10-31 MED ORDER — ONDANSETRON 4 MG PO TBDP: 4.0000 mg | ORAL_TABLET | ORAL | 2 refills | Status: DC | PRN

## 2018-10-31 MED ORDER — LINACLOTIDE 72 MCG PO CAPS: 72.0000 ug | ORAL_CAPSULE | Freq: Every day | ORAL | 11 refills | Status: DC

## 2018-10-31 MED ORDER — PANTOPRAZOLE SODIUM 40 MG PO TBEC: 40.0000 mg | DELAYED_RELEASE_TABLET | Freq: Every day | ORAL | 3 refills | Status: DC

## 2018-10-31 NOTE — Progress Notes (Signed)
Chief Complaint: Nausea and epigastric pain  HPI:    [name] is a 53 year old Caucasian female, known to Dr. Hilarie Fredrickson, who presents to clinic today with a complaint of nausea and epigastric pain.    08/27/2017 patient seen in clinic with chronic constipation.  Had previously been seen for epigastric pains 03/27/2017 at that time and recommended EGD and colonoscopy.  At that time patient doing well and Linzess 72 mcg daily.    04/06/2017 EGD showed 2 cm hiatal hernia, normal mucosa in the entire esophagus, gastritis and duodenal ulcers with duodenitis.  Patient restarted on Pantoprazole 40 mg twice daily.    Today, the patient presents to clinic and explains that for the past couple of months she has been constantly nauseous.  This nausea does not go away when she eats.  She has had one episode of vomiting which actually awoke her from sleep.  Most recently she restarted her Pantoprazole 40 mg, had not been taking this for 4-5 month.  She has been taking it once a day or twice a day over the past couple of weeks and seems to think this is helping but she has run out of this medicine now.  Associated symptoms include epigastric pain.  Patient denies NSAID use.    IBS-C is well controlled on Linzess 72 mcg daily.  Patient does need a refill.    Social history positive for a large amount of stress in her life recently with the selling of her dad's property who passed away 2 years ago, she is now having to find a new house.  Patient is tearful.    Denies fever, chills, weight loss, change in bowel habits or blood in her stool.   Past Medical History:  Diagnosis Date   Anxiety    Chronic headaches    Depression    Gall stones    H. pylori infection    Migraines    Plantar fasciitis, bilateral     Past Surgical History:  Procedure Laterality Date   CHOLECYSTECTOMY  06/09/2011   Procedure: LAPAROSCOPIC CHOLECYSTECTOMY;  Surgeon: Zenovia Jarred, MD;  Location: [place];  Service: General;   Laterality: N/A;   DILATION AND CURETTAGE OF UTERUS     ERCP  06/08/2011   Procedure: ENDOSCOPIC RETROGRADE CHOLANGIOPANCREATOGRAPHY (ERCP);  Surgeon: Beryle Beams, MD;  Location: [facility] ENDOSCOPY;  Service: Endoscopy;  Laterality: N/A;   TONSILLECTOMY      Current Outpatient Medications  Medication Sig Dispense Refill   linaclotide (LINZESS) 72 MCG capsule Take 1 capsule (72 mcg total) by mouth daily before breakfast. 30 capsule 11   No current facility-administered medications for this visit.     Allergies as of 10/31/2018 - Review Complete 10/31/2018  Allergen Reaction Noted   Bactrim [sulfamethoxazole-trimethoprim] Rash and Other (See Comments) 11/04/2015    Family History  Problem Relation Age of Onset   Diabetes Mother    Heart disease Mother        AMI   Heart failure Father    Heart disease Father        CHF with defibrillator.   Hypertension Sister    Polycystic ovary syndrome Daughter    Heart disease Maternal Grandmother 75   Heart disease Maternal Grandfather 80    Social History   Socioeconomic History   Marital status: Legally Separated    Spouse name: Elta Guadeloupe   Number of children: Not on file   Years of education: 12th   Highest education level: Not on  file  Occupational History   Occupation: Merchandiser, retailCook/Attendant    Employer: [employer]   Occupation: Waitress    Comment: [employer]  Social Needs   Financial resource strain: Not on file   Food insecurity    Worry: Not on file    Inability: Not on Occupational hygienistfile   Transportation needs    Medical: Not on file    Non-medical: Not on file  Tobacco Use   Smoking status: Never Smoker   Smokeless tobacco: Never Used  Substance and Sexual Activity   Alcohol use: Yes    Comment: occasionally, 1/month   Drug use: No   Sexual activity: Yes    Birth control/protection: None    Comment: no menstruation in "years."  Lifestyle   Physical activity    Days per week: Not on file    Minutes per session: Not  on file   Stress: Not on file  Relationships   Social connections    Talks on phone: Not on file    Gets together: Not on file    Attends religious service: Not on file    Active member of club or organization: Not on file    Attends meetings of clubs or organizations: Not on file    Relationship status: Not on file   Intimate partner violence    Fear of current or ex partner: Not on file    Emotionally abused: Not on file    Physically abused: Not on file    Forced sexual activity: Not on file  Other Topics Concern   Not on file  Social History Narrative   Marital status: Married      Children: 3 children      Lives: with daughter and husband      Employment: [place] at [employer] [place]      Tobacco; none      Alcohol:  Socially; weekends.      Drugs: none     Exercise: none    Review of Systems:    Constitutional: No weight loss, fever or chills Cardiovascular: No chest pain Respiratory: No SOB  Gastrointestinal: See HPI and otherwise negative   Physical Exam:  Vital signs: BP 132/84    Pulse 85    Temp (!) 97 F (36.1 C) (Temporal)    Ht 5\' 2"  (1.575 m)    Wt 127 lb (57.6 kg)    BMI 23.23 kg/m   Constitutional:   Tearful Pleasant Caucasian female appears to be in NAD, Well developed, Well nourished, alert and cooperative Respiratory: Respirations even and unlabored. Lungs clear to auscultation bilaterally.   No wheezes, crackles, or rhonchi.  Cardiovascular: Normal S1, S2. No MRG. Regular rate and rhythm. No peripheral edema, cyanosis or pallor.  Gastrointestinal:  Soft, nondistended, Mild epigastric ttp, mils RLQ ttp No rebound or guarding. Normal bowel sounds. No appreciable masses or hepatomegaly. Rectal:  Not performed.  Psychiatric:  Demonstrates good judgement and reason without abnormal affect or behaviors.  MOST RECENT LABS AND IMAGING: CBC    Component Value Date/Time   WBC 8.3 07/29/2018 1423   RBC 4.61 07/29/2018 1423   HGB 15.1 07/29/2018 1423    HCT 43.9 07/29/2018 1423   PLT 210 07/29/2018 1423   MCV 95.2 07/29/2018 1423   MCV 90.2 12/20/2015 1230   MCH 32.8 07/29/2018 1423   MCHC 34.4 07/29/2018 1423   RDW 12.3 07/29/2018 1423   LYMPHSABS 2,830 07/29/2018 1423   MONOABS 0.5 04/27/2017 0850   EOSABS 423 07/29/2018  1423   BASOSABS 58 07/29/2018 1423    CMP     Component Value Date/Time   NA 138 07/29/2018 1423   K 3.8 07/29/2018 1423   CL 105 07/29/2018 1423   CO2 26 07/29/2018 1423   GLUCOSE 81 07/29/2018 1423   BUN 15 07/29/2018 1423   CREATININE 0.59 07/29/2018 1423   CALCIUM 10.2 07/29/2018 1423   PROT 6.8 07/29/2018 1423   ALBUMIN 3.8 04/27/2017 0850   AST 20 07/29/2018 1423   ALT 18 07/29/2018 1423   ALKPHOS 44 04/27/2017 0850   BILITOT 0.4 07/29/2018 1423   GFRNONAA >60 11/15/2017 0910   GFRAA >60 11/15/2017 0910    Assessment: 1.  Epigastric pain: Patient with history of duodenal ulcers, pain and nausea are some better after using Pantoprazole for the past couple of weeks; likely related to PUD +/- gastritis +/- anxiety and IBS 2.  Nausea: With above 3.  IBS-C: Well-controlled on Linzess 72 mcg daily  Plan: 1.  Prescribed Zofran 4 mg every 4-6 hours as needed for nausea #60 with 2 refills 2.  Refilled Linzess 72 mcg daily #90 with 3 refills 3.  Prescribed Pantoprazole 40 mg twice daily, 30-60 minutes before breakfast and dinner #60 with 3 refills 4.  Discussed that likely stress and anxiety have played a role in her current symptoms 5.  Patient to call our clinic in 4 weeks if she continues with symptoms. 6.  Did discuss screening colonoscopy today but patient is having some money problems and would not be able to afford at this time, will need to rediscuss in the future.  Hyacinth Meeker, PA-C [facility] 10/31/2018, 3:04 PM  Cc: Dianne Dun, MD

## 2018-10-31 NOTE — Patient Instructions (Addendum)
We have sent the following medications to your pharmacy for you to pick up at your convenience:  Pantoprazole 40 mg twice a day  Zofran 4 mg every 4-6 hours as needed   Linzess   Please call the office with an update of your symptoms in the next one month and ask for Hughie Closs, RN.

## 2018-11-27 NOTE — Progress Notes (Signed)
Addendum: Reviewed and agree with assessment and management plan. Screening colonoscopy is recommended.  Pt to contact us when willing to schedule. Torrey Horseman, Lajuan Lines, MD

## 2018-12-13 NOTE — Telephone Encounter (Signed)
Error

## 2019-02-05 ENCOUNTER — Other Ambulatory Visit: Payer: Self-pay

## 2019-02-05 ENCOUNTER — Telehealth: Payer: Self-pay | Admitting: Physician Assistant

## 2019-02-05 ENCOUNTER — Other Ambulatory Visit: Payer: BC Managed Care – PPO

## 2019-02-05 DIAGNOSIS — R110 Nausea: Secondary | ICD-10-CM

## 2019-02-05 DIAGNOSIS — Z8619 Personal history of other infectious and parasitic diseases: Secondary | ICD-10-CM | POA: Diagnosis not present

## 2019-02-05 MED ORDER — PROMETHAZINE HCL 25 MG PO TABS: 25.0000 mg | ORAL_TABLET | Freq: Four times a day (QID) | ORAL | 2 refills | Status: DC | PRN

## 2019-02-05 NOTE — Telephone Encounter (Signed)
Please order H.pylori ab test. Can also try Phenergan- tell her this can make her tired - 25 mg PO q6 hours #20 refill x2.  Thanks-JLL

## 2019-02-05 NOTE — Telephone Encounter (Signed)
Are you wanting the H Pylori stool test or something different ?

## 2019-02-05 NOTE — Telephone Encounter (Signed)
Pt has been experiencing vomiting since yesterday, she would like some advise. Pls call her.

## 2019-02-05 NOTE — Telephone Encounter (Signed)
H.pylori blood ab test thanks-JLL

## 2019-02-05 NOTE — Telephone Encounter (Signed)
Sent order to patient's pharmacy for phenergan and gave patient instructions for taking. Let her know it could make her sleepy. She said she will take the Zofran while working Phenergan at night. Patient will come into our lab for H Pylori blood test this week

## 2019-02-05 NOTE — Addendum Note (Signed)
Addended by: Isaiah Serge D on: 02/05/2019 03:14 PM   Modules accepted: Orders

## 2019-02-05 NOTE — Telephone Encounter (Signed)
Called patient back and she says her nausea had gotten worse again the last few days. Only had 1 emesis on Friday, but she is constantly nauseated. States it doesn't make a difference if she has eaten or not. No other symptoms of fever or BM changes. She is taking Pantoprazole-40mg  1-2 times a day and Zofran-4mg  3-4 times a day. Says she is losing weight because she can't eat. Please advise.

## 2019-02-06 ENCOUNTER — Other Ambulatory Visit: Payer: Self-pay

## 2019-02-06 ENCOUNTER — Telehealth: Payer: Self-pay | Admitting: Physician Assistant

## 2019-02-06 MED ORDER — AMOXICILLIN 500 MG PO TABS: 1000.0000 mg | ORAL_TABLET | Freq: Two times a day (BID) | ORAL | 0 refills | Status: DC

## 2019-02-06 MED ORDER — PANTOPRAZOLE SODIUM 40 MG PO TBEC: 40.0000 mg | DELAYED_RELEASE_TABLET | Freq: Two times a day (BID) | ORAL | 0 refills | Status: DC

## 2019-02-06 MED ORDER — CLARITHROMYCIN 500 MG PO TABS: 500.0000 mg | ORAL_TABLET | Freq: Two times a day (BID) | ORAL | 0 refills | Status: DC

## 2019-02-06 MED ORDER — METRONIDAZOLE 500 MG PO TABS: 500.0000 mg | ORAL_TABLET | Freq: Two times a day (BID) | ORAL | 0 refills | Status: DC

## 2019-02-06 NOTE — Telephone Encounter (Signed)
See result note.  

## 2019-02-06 NOTE — Telephone Encounter (Signed)
Pt stated that she is returning your call.  °

## 2019-02-07 LAB — H PYLORI, IGM, IGG, IGA AB
H pylori, IgM Abs: <9 units (ref 0.0–8.9)
H. pylori, IgA Abs: <9 units (ref 0.0–8.9)
H. pylori, IgG AbS: 1.15 Index Value — ABNORMAL HIGH (ref 0.00–0.79)

## 2019-02-11 ENCOUNTER — Ambulatory Visit (INDEPENDENT_AMBULATORY_CARE_PROVIDER_SITE_OTHER): Payer: BC Managed Care – PPO | Admitting: Nurse Practitioner

## 2019-02-11 ENCOUNTER — Encounter: Payer: Self-pay | Admitting: Nurse Practitioner

## 2019-02-11 ENCOUNTER — Other Ambulatory Visit: Payer: Self-pay

## 2019-02-11 VITALS — Temp 98.6°F | Ht 62.4 in | Wt 125.0 lb

## 2019-02-11 DIAGNOSIS — R110 Nausea: Secondary | ICD-10-CM

## 2019-02-11 MED ORDER — ONDANSETRON HCL 8 MG PO TABS: 8.0000 mg | ORAL_TABLET | Freq: Three times a day (TID) | ORAL | 1 refills | Status: DC | PRN

## 2019-02-11 NOTE — Patient Instructions (Addendum)
Take zofran 71mins prior to food intake. Contact GI if no improvement with zofran  Work note provided  Administrator, sports for Gastroesophageal Reflux Disease, Adult When you have gastroesophageal reflux disease (GERD), the foods you eat and your eating habits are very important. Choosing the right foods can help ease your discomfort. Think about working with a nutrition specialist (dietitian) to help you make good choices. What are tips for following this plan?  Meals  Choose healthy foods that are low in fat, such as fruits, vegetables, whole grains, low-fat dairy products, and lean meat, fish, and poultry.  Eat small meals often instead of 3 large meals a day. Eat your meals slowly, and in a place where you are relaxed. Avoid bending over or lying down until 2-3 hours after eating.  Avoid eating meals 2-3 hours before bed.  Avoid drinking a lot of liquid with meals.  Cook foods using methods other than frying. Bake, grill, or broil food instead.  Avoid or limit: ? Chocolate. ? Peppermint or spearmint. ? Alcohol. ? Pepper. ? Black and decaffeinated coffee. ? Black and decaffeinated tea. ? Bubbly (carbonated) soft drinks. ? Caffeinated energy drinks and soft drinks.  Limit high-fat foods such as: ? Fatty meat or fried foods. ? Whole milk, cream, butter, or ice cream. ? Nuts and nut butters. ? Pastries, donuts, and sweets made with butter or shortening.  Avoid foods that cause symptoms. These foods may be different for everyone. Common foods that cause symptoms include: ? Tomatoes. ? Oranges, lemons, and limes. ? Peppers. ? Spicy food. ? Onions and garlic. ? Vinegar. Lifestyle  Maintain a healthy weight. Ask your doctor what weight is healthy for you. If you need to lose weight, work with your doctor to do so safely.  Exercise for at least 30 minutes for 5 or more days each week, or as told by your doctor.  Wear loose-fitting clothes.  Do not smoke. If you need help  quitting, ask your doctor.  Sleep with the head of your bed higher than your feet. Use a wedge under the mattress or blocks under the bed frame to raise the head of the bed. Summary  When you have gastroesophageal reflux disease (GERD), food and lifestyle choices are very important in easing your symptoms.  Eat small meals often instead of 3 large meals a day. Eat your meals slowly, and in a place where you are relaxed.  Limit high-fat foods such as fatty meat or fried foods.  Avoid bending over or lying down until 2-3 hours after eating.  Avoid peppermint and spearmint, caffeine, alcohol, and chocolate. This information is not intended to replace advice given to you by your health care provider. Make sure you discuss any questions you have with your health care provider. Document Released: 08/15/2011 Document Revised: 06/06/2018 Document Reviewed: 03/21/2016 Elsevier Patient Education  2020 [employer].

## 2019-02-11 NOTE — Progress Notes (Signed)
Virtual Visit via Video Note  I connected with [name] on 02/11/19 at 10:15 AM EST by a video enabled telemedicine application and verified that I am speaking with the correct person using two identifiers.  Location: Patient: Home Provider:Office Participants: patient and  I discussed the limitations of evaluation and management by telemedicine and the availability of in person appointments. The patient expressed understanding and agreed to proceed.  CC: vomiting, headache, little cough x 2 days. Pt seen by gasto and was dx with H-pylori pt not sure if new meds is causing symptoms.  History of Present Illness: Emesis  This is a new problem. The current episode started yesterday. The problem occurs less than 2 times per day. The problem has been unchanged. The emesis has an appearance of stomach contents. There has been no fever. Associated symptoms include headaches. Pertinent negatives include no abdominal pain, chills, diarrhea, dizziness, fever, myalgias, sweats, URI or weight loss. She has tried increased fluids (phenergan) for the symptoms.  onset after taking oral abx for H-pylori infection. Headache not similar to previous migraine HA. Cough is mild and intermittent, non productive.   Observations/Objective: Physical Exam  Constitutional: She is oriented to person, place, and time.  Pulmonary/Chest: Effort normal.  Neurological: She is alert and oriented to person, place, and time.   Assessment and Plan: [name] was seen today for emesis.  Diagnoses and all orders for this visit:  Nausea -     ondansetron (ZOFRAN) 8 MG tablet; Take 1 tablet (8 mg total) by mouth every 8 (eight) hours as needed for nausea or vomiting.   Follow Up Instructions: See avs   I discussed the assessment and treatment plan with the patient. The patient was provided an opportunity to ask questions and all were answered. The patient agreed with the plan and demonstrated an understanding of  the instructions.   The patient was advised to call back or seek an in-person evaluation if the symptoms worsen or if the condition fails to improve as anticipated.  Wilfred Lacy, NP

## 2019-02-12 ENCOUNTER — Encounter: Payer: Self-pay | Admitting: Nurse Practitioner

## 2019-02-17 ENCOUNTER — Telehealth: Payer: Self-pay | Admitting: Family Medicine

## 2019-02-17 NOTE — Telephone Encounter (Signed)
Pt called stating that she believes she has a yeast infection due to the 3 antibiotics she is on. Pt is requesting to have a pill sent in for her. Please advise.    [address]  [address] [place] [address]  Phone: [phone] Fax: [phone]  Not a 24 hour pharmacy; exact hours not known.

## 2019-02-18 ENCOUNTER — Telehealth: Payer: Self-pay | Admitting: Family Medicine

## 2019-02-18 MED ORDER — FLUCONAZOLE 150 MG PO TABS: 150.0000 mg | ORAL_TABLET | Freq: Once | ORAL | 0 refills | Status: AC

## 2019-02-18 NOTE — Telephone Encounter (Signed)
Pt stated she received 3 abx from GI doctor on 12/10/2020and she starting to develop yeast infection-red and irritate. Pt was wondering if we can send medication to help with yeast injection. Dr. Deborra Medina please advise    Copied from [place] [id number]. Topic: Quick Communication - See Telephone Encounter >> Feb 18, 2019  3:13 PM Loma Boston wrote: CRM for notification. See Telephone encounter for: 02/18/[phone] Pls Fu with pt. Has called [facility] AND STATES THAT SHE IS HAVING ITCHING, REDNESS AND IS SWOLLEN. Pls FU

## 2019-02-18 NOTE — Telephone Encounter (Signed)
Yes absolutely.  DIflucan sent to pharmacy on file.  Please keep [place] updated with your symptoms.

## 2019-02-18 NOTE — Telephone Encounter (Signed)
I called pt and left a message for her to call and inform us of symptoms.  Please see message and advise.  Thank you.

## 2019-02-19 NOTE — Telephone Encounter (Signed)
Left detail message inform the pt.  

## 2019-02-25 ENCOUNTER — Telehealth: Payer: Self-pay | Admitting: Physician Assistant

## 2019-02-26 ENCOUNTER — Telehealth: Payer: Self-pay

## 2019-02-26 ENCOUNTER — Other Ambulatory Visit: Payer: Self-pay

## 2019-02-26 MED ORDER — ONDANSETRON 8 MG PO TBDP: 8.0000 mg | ORAL_TABLET | Freq: Three times a day (TID) | ORAL | 1 refills | Status: DC | PRN

## 2019-02-26 NOTE — Telephone Encounter (Signed)
I reviewed her chart. This appears to be a chronic issue. Treated for H pylori based on serology, although biopsies in the past on her EGD were negative, so unclear if this was truly causing her symptoms. Would recommend H pylori eradication testing with a stool antigen. In order for this to be accurate she will need to hold the protonix for 2 weeks before she submits it. She can take zofran every 8 hours scheduled in the interim, would see if that helps. Would like to get the H pylori stool test back prior to further testing. Would consider a gastric emptying study, vs. Trial of other regimen for dyspepsia, Dr. Hilarie Fredrickson can decide that moving forward.

## 2019-02-26 NOTE — Telephone Encounter (Signed)
Called patient back, and gave Dr. Doyne Keel recommendation.  Asked her to hold her Pantoprazole for 2 weeks and then we would retest to see if the H pylori is gone. Her initial test was the blood test, is that what you want the repeat test to be, or do you want a stool test. Please advise

## 2019-02-26 NOTE — Telephone Encounter (Signed)
Left message for patient to please call back. 

## 2019-02-26 NOTE — Telephone Encounter (Signed)
Patient called back and states, she has finished her antibiotics for the H pylori but she is still having nausea daily and epi-gastric pain. It is not related to eating or not, only 1 emesis on 02/24/19 (after drinking tea). No fever. BMs are normal. She is taking Zofran during the day and Phenergan sometimes at night. Also taking her Pantoprazole. Ellouise Newer PA and Dr. Hilarie Fredrickson are out of the office. As DOD please advise.

## 2019-03-11 ENCOUNTER — Telehealth: Payer: Self-pay

## 2019-03-11 ENCOUNTER — Other Ambulatory Visit: Payer: Self-pay

## 2019-03-11 DIAGNOSIS — Z8619 Personal history of other infectious and parasitic diseases: Secondary | ICD-10-CM

## 2019-03-11 NOTE — Telephone Encounter (Signed)
Called and left message on patient's voice mail (OK per patient) to please come into our lab and get her repeat H pylori blood test done. Also that she should have been off her Protonix for 2 weeks before. Asked to please call if any questions about this.

## 2019-03-11 NOTE — Telephone Encounter (Signed)
-----   Message from Antony Blackbird, RN sent at 02/26/2019  3:03 PM EST ----- Call and ask pt. to come in for repeat H pylori test (blood test ? Instead of stool ?). Check Jennifer's order.

## 2019-04-14 DIAGNOSIS — F3340 Major depressive disorder, recurrent, in remission, unspecified: Secondary | ICD-10-CM | POA: Diagnosis not present

## 2019-04-14 DIAGNOSIS — F411 Generalized anxiety disorder: Secondary | ICD-10-CM | POA: Diagnosis not present

## 2019-04-14 DIAGNOSIS — F4310 Post-traumatic stress disorder, unspecified: Secondary | ICD-10-CM | POA: Diagnosis not present

## 2019-06-09 ENCOUNTER — Encounter: Payer: Self-pay | Admitting: Nurse Practitioner

## 2019-06-09 ENCOUNTER — Telehealth (INDEPENDENT_AMBULATORY_CARE_PROVIDER_SITE_OTHER): Payer: BC Managed Care – PPO | Admitting: Nurse Practitioner

## 2019-06-09 ENCOUNTER — Other Ambulatory Visit: Payer: Self-pay

## 2019-06-09 VITALS — BP 124/87 | HR 88 | Ht 62.4 in | Wt 121.0 lb

## 2019-06-09 DIAGNOSIS — R300 Dysuria: Secondary | ICD-10-CM

## 2019-06-09 MED ORDER — PHENAZOPYRIDINE HCL 100 MG PO TABS: 100.0000 mg | ORAL_TABLET | Freq: Three times a day (TID) | ORAL | 0 refills | Status: DC

## 2019-06-09 MED ORDER — NITROFURANTOIN MONOHYD MACRO 100 MG PO CAPS: 100.0000 mg | ORAL_CAPSULE | Freq: Two times a day (BID) | ORAL | 0 refills | Status: AC

## 2019-06-09 NOTE — Patient Instructions (Signed)
Maintain adequate oral hydration Call office if no improvement in 3-5days.   Hematuria, Adult Hematuria is blood in the urine. Blood may be visible in the urine, or it may be identified with a test. This condition can be caused by infections of the bladder, urethra, kidney, or prostate. Other possible causes include:  Kidney stones.  Cancer of the urinary tract.  Too much calcium in the urine.  Conditions that are passed from parent to child (inherited conditions).  Exercise that requires a lot of energy. Infections can usually be treated with medicine, and a kidney stone usually will pass through your urine. If neither of these is the cause of your hematuria, more tests may be needed to identify the cause of your symptoms. It is very important to tell your health care provider about any blood in your urine, even if it is painless or the blood stops without treatment. Blood in the urine, when it happens and then stops and then happens again, can be a symptom of a very serious condition, including cancer. There is no pain in the initial stages of many urinary cancers. Follow these instructions at home: Medicines  Take over-the-counter and prescription medicines only as told by your health care provider.  If you were prescribed an antibiotic medicine, take it as told by your health care provider. Do not stop taking the antibiotic even if you start to feel better. Eating and drinking  Drink enough fluid to keep your urine clear or pale yellow. It is recommended that you drink 3-4 quarts (2.8-3.8 L) a day. If you have been diagnosed with an infection, it is recommended that you drink cranberry juice in addition to large amounts of water.  Avoid caffeine, tea, and carbonated beverages. These tend to irritate the bladder.  Avoid alcohol because it may irritate the prostate (men). General instructions  If you have been diagnosed with a kidney stone, follow your health care provider's  instructions about straining your urine to catch the stone.  Empty your bladder often. Avoid holding urine for long periods of time.  If you are female: ? After a bowel movement, wipe from front to back and use each piece of toilet paper only once. ? Empty your bladder before and after sex.  Pay attention to any changes in your symptoms. Tell your health care provider about any changes or any new symptoms.  It is your responsibility to get your test results. Ask your health care provider, or the department performing the test, when your results will be ready.  Keep all follow-up visits as told by your health care provider. This is important. Contact a health care provider if:  You develop back pain.  You have a fever.  You have nausea or vomiting.  Your symptoms do not improve after 3 days.  Your symptoms get worse. Get help right away if:  You develop severe vomiting and are unable take medicine without vomiting.  You develop severe pain in your back or abdomen even though you are taking medicine.  You pass a large amount of blood in your urine.  You pass blood clots in your urine.  You feel very weak or like you might faint.  You faint. Summary  Hematuria is blood in the urine. It has many possible causes.  It is very important that you tell your health care provider about any blood in your urine, even if it is painless or the blood stops without treatment.  Take over-the-counter and prescription medicines only  as told by your health care provider.  Drink enough fluid to keep your urine clear or pale yellow. This information is not intended to replace advice given to you by your health care provider. Make sure you discuss any questions you have with your health care provider. Document Revised: 07/10/2018 Document Reviewed: 03/18/2016 Elsevier Patient Education  2020 [employer].

## 2019-06-09 NOTE — Progress Notes (Signed)
Virtual Visit via Video Note  I connected with@ on 06/09/19 at 12:30 PM EDT by a video enabled telemedicine application and verified that I am speaking with the correct person using two identifiers.  Location: Patient:Home Provider: Office Participants: patient and provider   I discussed the limitations of evaluation and management by telemedicine and the availability of in person appointments. I also discussed with the patient that there may be a patient responsible charge related to this service. The patient expressed understanding and agreed to proceed.  CC: pt stated pain with urination and blood-some bright and some not-pt said no periods in years-no back pain or low abd pain just with urination an not aot of urine  History of Present Illness: Urinary Tract Infection  This is a new problem. The current episode started yesterday. The problem occurs every urination. The problem has been unchanged. The quality of the pain is described as burning. There has been no fever. She is not sexually active. There is no history of pyelonephritis. Associated symptoms include frequency and hematuria. Pertinent negatives include no chills, flank pain, hesitancy, nausea, possible pregnancy, sweats, urgency or vomiting. She has tried nothing for the symptoms. There is no history of catheterization, kidney stones, recurrent UTIs, a single kidney, urinary stasis or a urological procedure.  postmenopausal   Reviewed and reconciled medication list. Reviewed medical and surgical hx .  Observations/Objective: Physical Exam  Constitutional: She is oriented to person, place, and time. No distress.  Pulmonary/Chest: Effort normal.  Abdominal: She exhibits no distension.  Neurological: She is alert and oriented to person, place, and time.   Assessment and Plan: Roann was seen today for urinary tract infection.  Diagnoses and all orders for this visit:  Dysuria -     nitrofurantoin,  macrocrystal-monohydrate, (MACROBID) 100 MG capsule; Take 1 capsule (100 mg total) by mouth 2 (two) times daily for 7 days. -     phenazopyridine (PYRIDIUM) 100 MG tablet; Take 1 tablet (100 mg total) by mouth 3 (three) times daily with meals.   Follow Up Instructions: See avs   I discussed the assessment and treatment plan with the patient. The patient was provided an opportunity to ask questions and all were answered. The patient agreed with the plan and demonstrated an understanding of the instructions.   The patient was advised to call back or seek an in-person evaluation if the symptoms worsen or if the condition fails to improve as anticipated.  I provided 10 minutes of non-face-to-face time during this encounter.   Alysia Penna, NP

## 2019-06-10 ENCOUNTER — Other Ambulatory Visit: Payer: Self-pay | Admitting: Gastroenterology

## 2019-06-16 DIAGNOSIS — F329 Major depressive disorder, single episode, unspecified: Secondary | ICD-10-CM | POA: Diagnosis not present

## 2019-06-19 ENCOUNTER — Telehealth: Payer: Self-pay | Admitting: Nurse Practitioner

## 2019-06-19 DIAGNOSIS — R300 Dysuria: Secondary | ICD-10-CM

## 2019-06-19 NOTE — Telephone Encounter (Signed)
Pt's scheduled for lab appt tomorrow at 3 pm.

## 2019-06-19 NOTE — Telephone Encounter (Signed)
Patient called to say her UTI is not any better. I tried to schedule her but due to work, she is unable to come into the office today or tomorrow and neither is she available for a video visit until after 2:45 any day.

## 2019-06-19 NOTE — Telephone Encounter (Signed)
Needs to schedule lab appt for urine collection

## 2019-06-20 ENCOUNTER — Other Ambulatory Visit: Payer: BC Managed Care – PPO

## 2019-06-20 ENCOUNTER — Other Ambulatory Visit: Payer: Self-pay

## 2019-06-20 DIAGNOSIS — N3000 Acute cystitis without hematuria: Secondary | ICD-10-CM

## 2019-06-20 DIAGNOSIS — R300 Dysuria: Secondary | ICD-10-CM | POA: Diagnosis not present

## 2019-06-20 NOTE — Addendum Note (Signed)
Addended by: Varney Biles on: 06/20/2019 02:31 PM   Modules accepted: Orders

## 2019-06-21 LAB — URINE CULTURE
MICRO NUMBER:: 10399606
SPECIMEN QUALITY:: ADEQUATE

## 2019-06-21 LAB — URINALYSIS
Bilirubin Urine: NEGATIVE
Glucose, UA: NEGATIVE
Ketones, ur: NEGATIVE
Leukocytes,Ua: NEGATIVE
Nitrite: NEGATIVE
Protein, ur: NEGATIVE
Specific Gravity, Urine: 1.026 (ref 1.001–1.03)
pH: <=5 (ref 5.0–8.0)

## 2019-06-23 MED ORDER — AMOXICILLIN 500 MG PO TABS: 500.0000 mg | ORAL_TABLET | Freq: Three times a day (TID) | ORAL | 0 refills | Status: AC

## 2019-06-23 NOTE — Addendum Note (Signed)
Addended by: Michaela Corner on: 06/23/2019 04:27 PM   Modules accepted: Orders

## 2019-06-26 ENCOUNTER — Telehealth: Payer: BC Managed Care – PPO | Admitting: Family Medicine

## 2019-06-26 DIAGNOSIS — L304 Erythema intertrigo: Secondary | ICD-10-CM | POA: Diagnosis not present

## 2019-06-26 DIAGNOSIS — L299 Pruritus, unspecified: Secondary | ICD-10-CM | POA: Diagnosis not present

## 2019-07-01 ENCOUNTER — Other Ambulatory Visit: Payer: Self-pay | Admitting: Gastroenterology

## 2019-07-03 ENCOUNTER — Telehealth: Payer: Self-pay | Admitting: Physician Assistant

## 2019-07-03 ENCOUNTER — Other Ambulatory Visit: Payer: Self-pay | Admitting: Gastroenterology

## 2019-07-03 ENCOUNTER — Other Ambulatory Visit: Payer: Self-pay

## 2019-07-03 MED ORDER — ONDANSETRON 8 MG PO TBDP: 8.0000 mg | ORAL_TABLET | Freq: Four times a day (QID) | ORAL | 1 refills | Status: DC | PRN

## 2019-07-03 NOTE — Telephone Encounter (Signed)
Pt just scheduled appt with Victorino Dike for meds rf on 6/4 at 3:00pm. She is requesting some rf of zofran in the meantime, she stated that she ran out of med and she suffers from nausea everyday. Pls send rf to [facility] on [address].

## 2019-07-04 MED ORDER — ONDANSETRON 8 MG PO TBDP: 8.0000 mg | ORAL_TABLET | Freq: Four times a day (QID) | ORAL | 0 refills | Status: DC | PRN

## 2019-07-04 NOTE — Telephone Encounter (Signed)
Sent in refill for Zofran for patient until office visit. No additional refills if patient does not keep appointment.

## 2019-07-08 ENCOUNTER — Telehealth: Payer: Self-pay | Admitting: Nurse Practitioner

## 2019-07-08 NOTE — Telephone Encounter (Signed)
Pt called stating she has took the Pyridium that was given to her on her video visit call on 06/09/19 for 2 weeks and now she thinks she has a yeast infection and wonders if she can have something for that. Pt said last 4 or 5 days shes had some itching.

## 2019-07-08 NOTE — Telephone Encounter (Signed)
Patient states she has been taking antibiotics for 2 weeks for UTI and now thinks she has a yeast infection. Patient is asking for a prescription for yeast infection.

## 2019-07-09 NOTE — Telephone Encounter (Signed)
Left a voicemail on cell phone per DPR an informed her to call the office and make a F2F appointment and to use Monistat OTC until her appointment.

## 2019-07-09 NOTE — Telephone Encounter (Signed)
Pt called back and she stated she was not going to do a F2F appointment that she was seen via video call for something that should have been seen in person an now she needs to be seen an can be a video but now she can't even get meds sent in. Pt stated she wouldn't come in for a F2F and would get the OTC and find a new doctor.

## 2019-07-09 NOTE — Telephone Encounter (Signed)
She needs to schedule F2F office visit. She can use monistat OTC in meantime.

## 2019-07-16 ENCOUNTER — Telehealth: Payer: Self-pay | Admitting: General Practice

## 2019-07-16 DIAGNOSIS — R300 Dysuria: Secondary | ICD-10-CM

## 2019-07-16 NOTE — Telephone Encounter (Signed)
Charlotte please advise.  Pt is asking for a referral to urology. Pt stated she was seen on 06/09/19 for UTI and is still having problems.

## 2019-07-16 NOTE — Telephone Encounter (Signed)
Patient is calling and stated that she was seen on 4/12 for a uti and is still having symptoms. Patient is asking if she can be referred to a specialist. Pls advise. [phone]

## 2019-07-17 NOTE — Telephone Encounter (Signed)
Ok to enter referral.  I will also recommend for her to return to lab for repeat UA

## 2019-07-17 NOTE — Telephone Encounter (Signed)
Left pt a voicemail and sent a My Chart message to let her know that a referral was sent and [name] recommended to come in for urinalysis.

## 2019-07-28 DIAGNOSIS — R109 Unspecified abdominal pain: Secondary | ICD-10-CM | POA: Diagnosis not present

## 2019-07-28 DIAGNOSIS — R350 Frequency of micturition: Secondary | ICD-10-CM | POA: Diagnosis not present

## 2019-07-28 DIAGNOSIS — N12 Tubulo-interstitial nephritis, not specified as acute or chronic: Secondary | ICD-10-CM | POA: Diagnosis not present

## 2019-07-29 ENCOUNTER — Encounter: Payer: Self-pay | Admitting: *Deleted

## 2019-07-30 ENCOUNTER — Emergency Department (HOSPITAL_BASED_OUTPATIENT_CLINIC_OR_DEPARTMENT_OTHER): Payer: BC Managed Care – PPO

## 2019-07-30 ENCOUNTER — Other Ambulatory Visit: Payer: Self-pay

## 2019-07-30 ENCOUNTER — Emergency Department (HOSPITAL_BASED_OUTPATIENT_CLINIC_OR_DEPARTMENT_OTHER)
Admission: EM | Admit: 2019-07-30 | Discharge: 2019-07-30 | Disposition: A | Discharge status: Home / Self Care | Payer: BC Managed Care – PPO | Attending: Emergency Medicine | Admitting: Emergency Medicine

## 2019-07-30 ENCOUNTER — Encounter (HOSPITAL_BASED_OUTPATIENT_CLINIC_OR_DEPARTMENT_OTHER): Payer: Self-pay | Admitting: *Deleted

## 2019-07-30 DIAGNOSIS — R109 Unspecified abdominal pain: Secondary | ICD-10-CM | POA: Diagnosis not present

## 2019-07-30 DIAGNOSIS — R823 Hemoglobinuria: Secondary | ICD-10-CM | POA: Diagnosis not present

## 2019-07-30 DIAGNOSIS — Z79899 Other long term (current) drug therapy: Secondary | ICD-10-CM | POA: Diagnosis not present

## 2019-07-30 DIAGNOSIS — L299 Pruritus, unspecified: Secondary | ICD-10-CM | POA: Diagnosis not present

## 2019-07-30 LAB — CBC WITH DIFFERENTIAL/PLATELET
Abs Immature Granulocytes: 0.02 10*3/uL (ref 0.00–0.07)
Basophils Absolute: 0.1 10*3/uL (ref 0.0–0.1)
Basophils Relative: 1 %
Eosinophils Absolute: 0.1 10*3/uL (ref 0.0–0.5)
Eosinophils Relative: 1 %
HCT: 43.7 % (ref 36.0–46.0)
Hemoglobin: 14.6 g/dL (ref 12.0–15.0)
Immature Granulocytes: 0 %
Lymphocytes Relative: 26 %
Lymphs Abs: 2.2 10*3/uL (ref 0.7–4.0)
MCH: 32.2 pg (ref 26.0–34.0)
MCHC: 33.4 g/dL (ref 30.0–36.0)
MCV: 96.3 fL (ref 80.0–100.0)
Monocytes Absolute: 0.5 10*3/uL (ref 0.1–1.0)
Monocytes Relative: 6 %
Neutro Abs: 5.7 10*3/uL (ref 1.7–7.7)
Neutrophils Relative %: 66 %
Platelets: 232 10*3/uL (ref 150–400)
RBC: 4.54 MIL/uL (ref 3.87–5.11)
RDW: 12.8 % (ref 11.5–15.5)
WBC: 8.6 10*3/uL (ref 4.0–10.5)
nRBC: 0 % (ref 0.0–0.2)

## 2019-07-30 LAB — URINALYSIS, MICROSCOPIC (REFLEX)

## 2019-07-30 LAB — URINALYSIS, ROUTINE W REFLEX MICROSCOPIC
Bilirubin Urine: NEGATIVE
Glucose, UA: NEGATIVE mg/dL
Ketones, ur: NEGATIVE mg/dL
Leukocytes,Ua: NEGATIVE
Nitrite: NEGATIVE
Protein, ur: NEGATIVE mg/dL
Specific Gravity, Urine: >1.03 — ABNORMAL HIGH (ref 1.005–1.030)
pH: 5.5 (ref 5.0–8.0)

## 2019-07-30 LAB — COMPREHENSIVE METABOLIC PANEL
ALT: 17 U/L (ref 0–44)
AST: 20 U/L (ref 15–41)
Albumin: 4.1 g/dL (ref 3.5–5.0)
Alkaline Phosphatase: 52 U/L (ref 38–126)
Anion gap: 11 (ref 5–15)
BUN: 8 mg/dL (ref 6–20)
CO2: 27 mmol/L (ref 22–32)
Calcium: 10 mg/dL (ref 8.9–10.3)
Chloride: 103 mmol/L (ref 98–111)
Creatinine, Ser: 0.64 mg/dL (ref 0.44–1.00)
GFR calc Af Amer: >60 mL/min (ref >60)
GFR calc non Af Amer: >60 mL/min (ref >60)
Glucose, Bld: 80 mg/dL (ref 70–99)
Potassium: 3.3 mmol/L — ABNORMAL LOW (ref 3.5–5.1)
Sodium: 141 mmol/L (ref 135–145)
Total Bilirubin: 0.8 mg/dL (ref 0.3–1.2)
Total Protein: 7 g/dL (ref 6.5–8.1)

## 2019-07-30 NOTE — ED Triage Notes (Signed)
Presents with left flank pain x 1 month, states has been to several urgent care facilities, was prescribed Macrobid and developed a rash, was then prescribed Cipro and had another rash reaction.

## 2019-07-30 NOTE — ED Provider Notes (Signed)
[facility] EMERGENCY DEPARTMENT Provider Note   CSN: [id number] Arrival date & time: 07/30/19  1027     History Chief Complaint  Patient presents with  . Left Flank pain x 1 month    [name] is a 54 y.o. female.  54 year old female with complaint of left flank pain.  Patient states that she had a urinary tract infection (dysuria) started the beginning of April, had a video visit with her provider who sent in a prescription for Macrobid.  Patient states that this antibiotic made her itchy so she contacted the office, discontinued Macrobid and was started on amoxicillin.  Patient then went to urgent care where she was given Bactrim, states this caused her to break out in a rash made her throat feel tight.  Patient returned to urgent care and was found to have hematuria on UA, was started on Cipro.  Patient took 1 dose of the Cipro last night and noticed that her hands were itching so she discontinued the antibiotic and comes to the ER today.  Patient denies dysuria at this point, gross hematuria, nausea, vomiting, fevers.  Patient does have a history of kidney stones.  No other complaints or concerns today.        Past Medical History:  Diagnosis Date  . Anxiety   . Chronic headaches   . Common bile duct stone   . Depression   . Gall stones   . H. pylori infection   . Hiatal hernia   . Migraines   . Plantar fasciitis, bilateral   . Schatzki's ring     Patient Active Problem List   Diagnosis Date Noted  . History of Helicobacter pylori infection 08/26/2018  . History of duodenal ulcer 08/26/2018  . Family history of diabetes mellitus (DM) 08/05/2018  . History of nephrolithiasis 07/09/2018  . Hematuria 07/09/2018  . Dysfunction of eustachian tube 03/29/2018  . Bruxism 03/29/2018  . Breast pain, right 07/09/2017  . Bacterial infection due to H. pylori 03/19/2017  . Nausea 02/07/2017  . OSA (obstructive sleep apnea) 05/29/2016  . Hypersomnia  04/06/2016  . Lumbar degenerative disc disease 05/07/2015  . Other mixed anxiety disorders 08/28/2014  . S/P laparoscopic cholecystectomy 07/05/2011  . CERVICAL STRAIN 05/19/2009  . Allergic rhinitis 06/22/2008  . Migraine 06/22/2008  . DEPRESSION, MAJOR, MODERATE 06/15/2008  . PARESTHESIA 06/15/2008  . PULMONARY NODULE 04/17/2007  . MENORRHAGIA 07/02/2006  . NOCTURIA 04/26/2006    Past Surgical History:  Procedure Laterality Date  . CHOLECYSTECTOMY  06/09/2011   Procedure: LAPAROSCOPIC CHOLECYSTECTOMY;  Surgeon: Zenovia Jarred, MD;  Location: [facility];  Service: General;  Laterality: N/A;  . DILATION AND CURETTAGE OF UTERUS    . ERCP  06/08/2011   Procedure: ENDOSCOPIC RETROGRADE CHOLANGIOPANCREATOGRAPHY (ERCP);  Surgeon: Beryle Beams, MD;  Location: [facility] ENDOSCOPY;  Service: Endoscopy;  Laterality: N/A;  . TONSILLECTOMY       OB History   No obstetric history on file.     Family History  Problem Relation Age of Onset  . Diabetes Mother   . Heart disease Mother        AMI  . Heart failure Father   . Heart disease Father        CHF with defibrillator.  . Hypertension Sister   . Polycystic ovary syndrome Daughter   . Heart disease Maternal Grandmother 31  . Heart disease Maternal Grandfather 78    Social History   Tobacco Use  . Smoking  status: Never Smoker  . Smokeless tobacco: Never Used  Substance Use Topics  . Alcohol use: Yes    Comment: occasionally, 1/month  . Drug use: No    Home Medications Prior to Admission medications   Medication Sig Start Date End Date Taking? Authorizing Provider  ondansetron (ZOFRAN-ODT) 8 MG disintegrating tablet Take 1 tablet (8 mg total) by mouth every 6 (six) hours as needed for nausea or vomiting. 07/04/19   Unk Lightning, PA  phenazopyridine (PYRIDIUM) 100 MG tablet Take 1 tablet (100 mg total) by mouth 3 (three) times daily with meals. 06/09/19   Nche, Bonna Gains, NP  promethazine (PHENERGAN) 25 MG tablet Take 1  tablet (25 mg total) by mouth every 6 (six) hours as needed for nausea or vomiting. May make you sleepy 02/05/19   Unk Lightning, PA    Allergies    Bactrim [sulfamethoxazole-trimethoprim] and Sulfa antibiotics  Review of Systems   Review of Systems  Constitutional: Negative for chills and fever.  Gastrointestinal: Negative for abdominal pain, constipation, diarrhea, nausea and vomiting.  Genitourinary: Positive for flank pain. Negative for dysuria and hematuria.  Musculoskeletal: Positive for back pain.  Skin: Negative for rash and wound.  Allergic/Immunologic: Negative for immunocompromised state.  Neurological: Negative for weakness.  All other systems reviewed and are negative.   Physical Exam Updated Vital Signs BP 108/84 (BP Location: Right Arm)   Pulse 65   Temp 98.2 F (36.8 C) (Oral)   Resp 19   Ht 5\' 2"  (1.575 m)   Wt 54.4 kg   SpO2 100%   BMI 21.95 kg/m   Physical Exam Vitals and nursing note reviewed.  Constitutional:      General: She is not in acute distress.    Appearance: She is well-developed. She is not diaphoretic.  HENT:     Head: Normocephalic and atraumatic.  Cardiovascular:     Rate and Rhythm: Normal rate and regular rhythm.     Pulses: Normal pulses.     Heart sounds: Normal heart sounds.  Pulmonary:     Effort: Pulmonary effort is normal.     Breath sounds: Normal breath sounds.  Abdominal:     Palpations: Abdomen is soft.     Tenderness: There is no abdominal tenderness. There is no right CVA tenderness or left CVA tenderness.  Skin:    General: Skin is warm and dry.     Findings: No erythema or rash.  Neurological:     Mental Status: She is alert and oriented to person, place, and time.  Psychiatric:        Behavior: Behavior normal.     ED Results / Procedures / Treatments   Labs (all labs ordered are listed, but only abnormal results are displayed) Labs Reviewed  URINALYSIS, ROUTINE W REFLEX MICROSCOPIC - Abnormal;  Notable for the following components:      Result Value   Specific Gravity, Urine >1.030 (*)    Hgb urine dipstick LARGE (*)    All other components within normal limits  COMPREHENSIVE METABOLIC PANEL - Abnormal; Notable for the following components:   Potassium 3.3 (*)    All other components within normal limits  URINALYSIS, MICROSCOPIC (REFLEX) - Abnormal; Notable for the following components:   Bacteria, UA FEW (*)    All other components within normal limits  CBC WITH DIFFERENTIAL/PLATELET    EKG None  Radiology Renal  Result Date: 07/30/2019 CLINICAL DATA:  Left flank pain. EXAM: RENAL / URINARY TRACT  ULTRASOUND COMPLETE COMPARISON:  CT abdomen pelvis 07/12/2018 FINDINGS: Right Kidney: Renal measurements: 9.9 x 4.1 x 4.4 cm = volume: 93 mL . Echogenicity within normal limits. No hydronephrosis. There is a small cyst measuring 1.0 x 1.2 cm. Left Kidney: Renal measurements: 10.7 x 4.4 x 4.0 cm = volume: 100 mL. Echogenicity within normal limits. No hydronephrosis. Bladder: Appears normal for degree of bladder distention. Other: None. IMPRESSION: No hydronephrosis.  No sonographic evidence of intrarenal calculi. Electronically Signed   By: Emmaline Kluver M.D.   On: 07/30/2019 13:03    Procedures Procedures (including critical care time)  Medications Ordered in ED Medications - No data to display  ED Course  I have reviewed the triage vital signs and the nursing notes.  Pertinent labs & imaging results that were available during my care of the patient were reviewed by me and considered in my medical decision making (see chart for details).  Clinical Course as of Jul 30 1426  Wed Jul 30, 2019  6320 54 year old female with complaint of left flank pain.  Patient states her symptoms started a month and a half ago with dysuria, has been on multiple antibiotics although has not tolerated them well.  On exam, patient is well-appearing, no abdominal tenderness, no CVA  tenderness. Review of lab work, CBC within normal limits, CMP with very mild hypokalemia with potassium 3.3.  Urinalysis is positive for large hemoglobin. Renal ultrasound is negative for stones and hydronephrosis. Discussed possible musculoskeletal source of her back pain.  Recommend follow-up with PCP, can discontinue the antibiotics at this time.   [LM]  1428 Patient requests work note for 3 days, states that if she does not miss 3 days of work she will have to take points.  Advised patient that work note can be given however if any further paperwork is required regarding FMLA or otherwise she will have to get this from her PCP.  Patient insists that this is not the case of her job.   [LM]    Clinical Course User Index [LM] Alden Hipp   MDM Rules/Calculators/A&P                      Final Clinical Impression(s) / ED Diagnoses Final diagnoses:  Flank pain  Hemoglobinuria    Rx / DC Orders ED Discharge Orders    None       Jeannie Fend, PA-C 07/30/19 1428    Curatolo, Adam, DO 07/30/19 1441

## 2019-07-30 NOTE — Discharge Instructions (Signed)
Your work up today is reassuring. No signs of a urinary tract infection at this time. Recommend stopping antibiotics. Your renal ultrasound is reassuring, no stones in the kidney or signs of an obstructing stone.   Recommend recheck with your doctor. Take Motrin and Tylenol as needed as directed.

## 2019-08-01 ENCOUNTER — Ambulatory Visit: Payer: BC Managed Care – PPO | Admitting: Physician Assistant

## 2019-08-09 ENCOUNTER — Ambulatory Visit: Payer: Self-pay

## 2019-08-18 ENCOUNTER — Telehealth: Payer: Self-pay | Admitting: Physician Assistant

## 2019-08-18 NOTE — Telephone Encounter (Signed)
Left message on machine to call back no sooner appts available.

## 2019-08-19 NOTE — Telephone Encounter (Signed)
Left message on machine to call back message left for the pt to call if she continues to have problems or concerns

## 2019-08-26 ENCOUNTER — Encounter: Payer: Self-pay | Admitting: Physician Assistant

## 2019-08-26 ENCOUNTER — Ambulatory Visit: Payer: BC Managed Care – PPO | Admitting: Physician Assistant

## 2019-08-26 VITALS — BP 102/68 | HR 90 | Ht 62.0 in | Wt 118.2 lb

## 2019-08-26 DIAGNOSIS — Z1211 Encounter for screening for malignant neoplasm of colon: Secondary | ICD-10-CM

## 2019-08-26 DIAGNOSIS — R1013 Epigastric pain: Secondary | ICD-10-CM | POA: Diagnosis not present

## 2019-08-26 DIAGNOSIS — R110 Nausea: Secondary | ICD-10-CM | POA: Diagnosis not present

## 2019-08-26 DIAGNOSIS — Z1212 Encounter for screening for malignant neoplasm of rectum: Secondary | ICD-10-CM | POA: Diagnosis not present

## 2019-08-26 MED ORDER — ONDANSETRON HCL 4 MG PO TABS
4.0000 mg | ORAL_TABLET | Freq: Four times a day (QID) | ORAL | 11 refills | Status: DC | PRN
Start: 2019-08-26 — End: 2020-02-09

## 2019-08-26 MED ORDER — PANTOPRAZOLE SODIUM 40 MG PO TBEC: 40.0000 mg | DELAYED_RELEASE_TABLET | Freq: Two times a day (BID) | ORAL | 3 refills | Status: DC

## 2019-08-26 NOTE — Patient Instructions (Addendum)
If you are age 52 or older, your body mass index should be between 23-30. Your Body mass index is 21.63 kg/m. If this is out of the aforementioned range listed, please consider follow up with your Primary Care Provider.  If you are age 47 or younger, your body mass index should be between 19-25. Your Body mass index is 21.63 kg/m. If this is out of the aformentioned range listed, please consider follow up with your Primary Care Provider.   We have sent the following medications to your pharmacy for you to pick up at your convenience: Pantoprazole 40 mg twice daily 30-60 minutes before breakfast and dinner. Zofran 4 mg every 4-6 hours as needed for nausea.  It has been recommended to you by your physician that you have a(n) colonoscopy completed. Per your request, we did not schedule the procedure(s) today. Please contact our office at [phone] should you decide to have the procedure completed. You will be scheduled for a pre-visit and procedure at that time.

## 2019-08-26 NOTE — Progress Notes (Signed)
Chief Complaint: Follow-up nausea, abdominal pain and constipation  HPI:    [name] is a 54 year old female with a past medical history as listed below, known to Dr. Rhea Belton, who presents to clinic today for follow-up of her nausea, abdominal pain and constipation and refill of her medicines.    10/31/2018 patient seen in clinic and described nausea with epigastric pain.  She had a previous EGD 04/06/2017 with normal mucosa in the entire esophagus, gastritis and duodenal ulcers with duodenitis, restarted on pantoprazole 40 twice a day.  Also had a history of IBS-C which is well controlled on Linzess 72 mcg daily.  At that time prescribe Zofran 4 mg every 4-6 hours and refilled Linzess 72 mcg daily and prescribed pantoprazole 40 twice daily.  She was advised to schedule a screening colonoscopy when she had the money.    07/30/2019 patient seen in the ED for flank pain for a month.  Described a UTI starting beginning of April.  There was no sign of UTI at that time.  As discussed pain is likely musculoskeletal.  CBC normal.  Potassium minimally decreased at 3.3.  Renal ultrasound normal.    Today, the patient presents to clinic and explains that over the past couple of months she has had more epigastric pain.  Tells me that she has not been taking her Pantoprazole because "sometimes I just get tired of taking medicine".      Continues with chronic nausea and uses Zofran 4 mg every 4-6 hours as needed, typically uses 2 a day but occasionally will need more than that.  She has run out of this medication as well.    Tells me that she had a couple of weeks of loose stool but it is now back to formed.  She is not needing her Linzess anymore and is having daily bowel movements.    Social history positive for her father having passed 3 years ago now, patient is still tearful in the room when talking about this.    Denies fever, chills, weight loss, blood in her stool or symptoms that awaken her from sleep.  Past  Medical History:  Diagnosis Date  . Anxiety   . Chronic headaches   . Common bile duct stone   . Depression   . Gall stones   . H. pylori infection   . Hiatal hernia   . Migraines   . Plantar fasciitis, bilateral   . Schatzki's ring     Past Surgical History:  Procedure Laterality Date  . CHOLECYSTECTOMY  06/09/2011   Procedure: LAPAROSCOPIC CHOLECYSTECTOMY;  Surgeon: Liz Malady, MD;  Location: [facility] OR;  Service: General;  Laterality: N/A;  . DILATION AND CURETTAGE OF UTERUS    . ERCP  06/08/2011   Procedure: ENDOSCOPIC RETROGRADE CHOLANGIOPANCREATOGRAPHY (ERCP);  Surgeon: Theda Belfast, MD;  Location: [facility] ENDOSCOPY;  Service: Endoscopy;  Laterality: N/A;  . TONSILLECTOMY      Current Outpatient Medications  Medication Sig Dispense Refill  . ondansetron (ZOFRAN-ODT) 8 MG disintegrating tablet Take 1 tablet (8 mg total) by mouth every 6 (six) hours as needed for nausea or vomiting. 60 tablet 0  . phenazopyridine (PYRIDIUM) 100 MG tablet Take 1 tablet (100 mg total) by mouth 3 (three) times daily with meals. 9 tablet 0  . promethazine (PHENERGAN) 25 MG tablet Take 1 tablet (25 mg total) by mouth every 6 (six) hours as needed for nausea or vomiting. May make you sleepy 20 tablet 2   No current  facility-administered medications for this visit.    Allergies as of 08/26/2019 - Review Complete 07/30/2019  Allergen Reaction Noted  . Bactrim [sulfamethoxazole-trimethoprim] Rash and Other (See Comments) 11/04/2015  . Sulfa antibiotics Rash 07/30/2019    Family History  Problem Relation Age of Onset  . Diabetes Mother   . Heart disease Mother        AMI  . Heart failure Father   . Heart disease Father        CHF with defibrillator.  . Hypertension Sister   . Polycystic ovary syndrome Daughter   . Heart disease Maternal Grandmother 50  . Heart disease Maternal Grandfather 11    Social History   Socioeconomic History  . Marital status: Legally Separated    Spouse name:  Loraine Leriche  . Number of children: Not on file  . Years of education: 12th  . Highest education level: Not on file  Occupational History  . Occupation: Merchandiser, retail: [employer]  . Occupation: Waitress    Comment: [employer]  Tobacco Use  . Smoking status: Never Smoker  . Smokeless tobacco: Never Used  Vaping Use  . Vaping Use: Never used  Substance and Sexual Activity  . Alcohol use: Yes    Comment: occasionally, 1/month  . Drug use: No  . Sexual activity: Yes    Birth control/protection: None    Comment: no menstruation in "years."  Other Topics Concern  . Not on file  Social History Narrative   Marital status: Married      Children: 3 children      Lives: with daughter and husband      Employment: works at [employer] in [place]      Tobacco; none      Alcohol:  Socially; weekends.      Drugs: none     Exercise: none   Social Determinants of Health   Financial Resource Strain:   . Difficulty of Paying Living Expenses:   Food Insecurity:   . Worried About Programme researcher, broadcasting/film/video in the Last Year:   . Barista in the Last Year:   Transportation Needs:   . Freight forwarder (Medical):   Marland Kitchen Lack of Transportation (Non-Medical):   Physical Activity:   . Days of Exercise per Week:   . Minutes of Exercise per Session:   Stress:   . Feeling of Stress :   Social Connections:   . Frequency of Communication with Friends and Family:   . Frequency of Social Gatherings with Friends and Family:   . Attends Religious Services:   . Active Member of Clubs or Organizations:   . Attends Banker Meetings:   Marland Kitchen Marital Status:   Intimate Partner Violence:   . Fear of Current or Ex-Partner:   . Emotionally Abused:   Marland Kitchen Physically Abused:   . Sexually Abused:     Review of Systems:    Constitutional: No weight loss, fever or chills Cardiovascular: No chest pain Respiratory: No SOB  Gastrointestinal: See HPI and otherwise negative   Physical Exam:  Vital  signs: BP 102/68 (BP Location: Right Arm, Patient Position: Sitting, Cuff Size: Normal)   Pulse 90   Ht 5\' 2"  (1.575 m)   Wt 118 lb 4 oz (53.6 kg)   BMI 21.63 kg/m   Constitutional:   Pleasant Caucasian female appears to be in NAD, Well developed, Well nourished, alert and cooperative Respiratory: Respirations even and unlabored. Lungs clear to auscultation bilaterally.  No wheezes, crackles, or rhonchi.  Cardiovascular: Normal S1, S2. No MRG. Regular rate and rhythm. No peripheral edema, cyanosis or pallor.  Gastrointestinal:  Soft, nondistended, nontender. No rebound or guarding. Normal bowel sounds. No appreciable masses or hepatomegaly. Psychiatric:  Demonstrates good judgement and reason without abnormal affect or behaviors.  RELEVANT LABS AND IMAGING: CBC    Component Value Date/Time   WBC 8.6 07/30/2019 1106   RBC 4.54 07/30/2019 1106   HGB 14.6 07/30/2019 1106   HCT 43.7 07/30/2019 1106   PLT 232 07/30/2019 1106   MCV 96.3 07/30/2019 1106   MCV 90.2 12/20/2015 1230   MCH 32.2 07/30/2019 1106   MCHC 33.4 07/30/2019 1106   RDW 12.8 07/30/2019 1106   LYMPHSABS 2.2 07/30/2019 1106   MONOABS 0.5 07/30/2019 1106   EOSABS 0.1 07/30/2019 1106   BASOSABS 0.1 07/30/2019 1106    CMP     Component Value Date/Time   NA 141 07/30/2019 1106   K 3.3 (L) 07/30/2019 1106   CL 103 07/30/2019 1106   CO2 27 07/30/2019 1106   GLUCOSE 80 07/30/2019 1106   BUN 8 07/30/2019 1106   CREATININE 0.64 07/30/2019 1106   CREATININE 0.59 07/29/2018 1423   CALCIUM 10.0 07/30/2019 1106   PROT 7.0 07/30/2019 1106   ALBUMIN 4.1 07/30/2019 1106   AST 20 07/30/2019 1106   ALT 17 07/30/2019 1106   ALKPHOS 52 07/30/2019 1106   BILITOT 0.8 07/30/2019 1106   GFRNONAA >60 07/30/2019 1106   GFRAA >60 07/30/2019 1106    Assessment: 1.  Epigastric pain: Likely related to known gastritis and duodenitis, patient has been off of her medicine 2.  Chronic nausea: With above 3.  Screening for  colorectal cancer: Patient is 49 and never had screening for colon cancer  Plan: 1.  Scheduled patient for screening colonoscopy in the [facility] with Dr. Rhea Belton.  Did discuss risks, benefits, limitations and alternatives and the patient agrees to proceed. 2.  Discussed with the patient that she needs to stay on her Pantoprazole, especially given her history of duodenal ulcers, likely this is contributing to her chronic nausea.  Refilled her Pantoprazole 40 mg twice daily, 30 to 60 minutes for breakfast and dinner #180 with 3 refills. 3.  Refilled Zofran 4 mg every 4-6 hours as needed for nausea #60 with 11 refills. 4.  Patient to follow in clinic per recommendations from Dr. Rhea Belton after time of procedure.  Hyacinth Meeker, PA-C [facility] 08/26/2019, 3:03 PM  Cc: Dianne Dun, MD

## 2019-08-27 ENCOUNTER — Telehealth: Payer: Self-pay | Admitting: Physician Assistant

## 2019-08-27 MED ORDER — ONDANSETRON 4 MG PO TBDP
4.0000 mg | ORAL_TABLET | Freq: Four times a day (QID) | ORAL | 11 refills | Status: DC | PRN
Start: 2019-08-27 — End: 2020-02-09

## 2019-08-27 NOTE — Telephone Encounter (Signed)
Sent script into pharmacy for Zofran ODT.

## 2019-09-07 DIAGNOSIS — M545 Low back pain: Secondary | ICD-10-CM | POA: Diagnosis not present

## 2019-09-09 NOTE — Progress Notes (Signed)
Addendum: Reviewed and agree with assessment and management plan. Joselynn Amoroso M, MD  

## 2019-09-29 DIAGNOSIS — F339 Major depressive disorder, recurrent, unspecified: Secondary | ICD-10-CM | POA: Diagnosis not present

## 2019-11-11 DIAGNOSIS — Z20822 Contact with and (suspected) exposure to covid-19: Secondary | ICD-10-CM | POA: Diagnosis not present

## 2019-11-11 DIAGNOSIS — R519 Headache, unspecified: Secondary | ICD-10-CM | POA: Diagnosis not present

## 2019-11-11 DIAGNOSIS — J029 Acute pharyngitis, unspecified: Secondary | ICD-10-CM | POA: Diagnosis not present

## 2019-11-11 DIAGNOSIS — R05 Cough: Secondary | ICD-10-CM | POA: Diagnosis not present

## 2019-11-14 DIAGNOSIS — K219 Gastro-esophageal reflux disease without esophagitis: Secondary | ICD-10-CM | POA: Diagnosis not present

## 2019-11-14 DIAGNOSIS — F419 Anxiety disorder, unspecified: Secondary | ICD-10-CM | POA: Diagnosis not present

## 2019-11-14 DIAGNOSIS — Z9049 Acquired absence of other specified parts of digestive tract: Secondary | ICD-10-CM | POA: Diagnosis not present

## 2019-11-14 DIAGNOSIS — G43009 Migraine without aura, not intractable, without status migrainosus: Secondary | ICD-10-CM | POA: Diagnosis not present

## 2019-12-10 ENCOUNTER — Telehealth: Payer: Self-pay | Admitting: Physician Assistant

## 2019-12-10 NOTE — Telephone Encounter (Signed)
Pt called stating that her ulcers have been bothering her. She has been experiencing "real bad pain" all week and it is getting worse. She would like to be seen asap. Pls leave a detailed msg because pt is at work and cannot used her phone.

## 2019-12-10 NOTE — Telephone Encounter (Signed)
Pt scheduled to see Amy Esterwood PA 12/15/19@10am . Left message for pt regarding appt date and time.

## 2019-12-15 ENCOUNTER — Ambulatory Visit: Payer: BC Managed Care – PPO | Admitting: Physician Assistant

## 2019-12-24 DIAGNOSIS — F419 Anxiety disorder, unspecified: Secondary | ICD-10-CM | POA: Diagnosis not present

## 2019-12-24 DIAGNOSIS — Z8711 Personal history of peptic ulcer disease: Secondary | ICD-10-CM | POA: Diagnosis not present

## 2019-12-24 DIAGNOSIS — Z131 Encounter for screening for diabetes mellitus: Secondary | ICD-10-CM | POA: Diagnosis not present

## 2019-12-24 DIAGNOSIS — K219 Gastro-esophageal reflux disease without esophagitis: Secondary | ICD-10-CM | POA: Diagnosis not present

## 2019-12-24 DIAGNOSIS — R202 Paresthesia of skin: Secondary | ICD-10-CM | POA: Diagnosis not present

## 2020-01-14 DIAGNOSIS — I950 Idiopathic hypotension: Secondary | ICD-10-CM | POA: Diagnosis not present

## 2020-01-14 DIAGNOSIS — H8110 Benign paroxysmal vertigo, unspecified ear: Secondary | ICD-10-CM | POA: Diagnosis not present

## 2020-01-15 DIAGNOSIS — K219 Gastro-esophageal reflux disease without esophagitis: Secondary | ICD-10-CM | POA: Diagnosis not present

## 2020-01-15 DIAGNOSIS — H538 Other visual disturbances: Secondary | ICD-10-CM | POA: Diagnosis not present

## 2020-01-15 DIAGNOSIS — Z8669 Personal history of other diseases of the nervous system and sense organs: Secondary | ICD-10-CM | POA: Diagnosis not present

## 2020-01-15 DIAGNOSIS — Z882 Allergy status to sulfonamides status: Secondary | ICD-10-CM | POA: Diagnosis not present

## 2020-01-15 DIAGNOSIS — Z79899 Other long term (current) drug therapy: Secondary | ICD-10-CM | POA: Diagnosis not present

## 2020-01-15 DIAGNOSIS — R42 Dizziness and giddiness: Secondary | ICD-10-CM | POA: Diagnosis not present

## 2020-01-15 DIAGNOSIS — I498 Other specified cardiac arrhythmias: Secondary | ICD-10-CM | POA: Diagnosis not present

## 2020-01-15 DIAGNOSIS — R110 Nausea: Secondary | ICD-10-CM | POA: Diagnosis not present

## 2020-01-15 DIAGNOSIS — F419 Anxiety disorder, unspecified: Secondary | ICD-10-CM | POA: Diagnosis not present

## 2020-01-15 DIAGNOSIS — F329 Major depressive disorder, single episode, unspecified: Secondary | ICD-10-CM | POA: Diagnosis not present

## 2020-01-19 ENCOUNTER — Telehealth: Payer: Self-pay | Admitting: Neurology

## 2020-01-19 NOTE — Telephone Encounter (Signed)
Patient called and requested an appointment for "dizziness that has been going on for awhile now." Advised patient she may need a new referral for this problem since she sees Dr. Everlena Cooper for headaches. Patient last seen 11/02/17.  Patient requests a call back from a nurse.

## 2020-01-19 NOTE — Telephone Encounter (Signed)
Pt advised she needs  A referral for new symptom or ask the front desk not sure since it has been over two years. Pt states she will find a provider in [place].

## 2020-02-09 ENCOUNTER — Ambulatory Visit: Payer: BC Managed Care – PPO | Admitting: Physician Assistant

## 2020-02-09 ENCOUNTER — Encounter: Payer: Self-pay | Admitting: Physician Assistant

## 2020-02-09 VITALS — BP 102/72 | HR 82 | Ht 62.0 in | Wt 114.2 lb

## 2020-02-09 DIAGNOSIS — R112 Nausea with vomiting, unspecified: Secondary | ICD-10-CM | POA: Diagnosis not present

## 2020-02-09 DIAGNOSIS — K5909 Other constipation: Secondary | ICD-10-CM

## 2020-02-09 DIAGNOSIS — Z1211 Encounter for screening for malignant neoplasm of colon: Secondary | ICD-10-CM

## 2020-02-09 DIAGNOSIS — R1013 Epigastric pain: Secondary | ICD-10-CM | POA: Diagnosis not present

## 2020-02-09 DIAGNOSIS — K219 Gastro-esophageal reflux disease without esophagitis: Secondary | ICD-10-CM

## 2020-02-09 DIAGNOSIS — Z1212 Encounter for screening for malignant neoplasm of rectum: Secondary | ICD-10-CM

## 2020-02-09 MED ORDER — ONDANSETRON 8 MG PO TBDP
8.0000 mg | ORAL_TABLET | Freq: Three times a day (TID) | ORAL | 3 refills | Status: DC | PRN
Start: 2020-02-09 — End: 2020-06-08

## 2020-02-09 MED ORDER — PLENVU 140 G PO SOLR
ORAL | 0 refills | Status: DC
Start: 2020-02-09 — End: 2020-02-09

## 2020-02-09 MED ORDER — PANTOPRAZOLE SODIUM 40 MG PO TBEC: 40.0000 mg | DELAYED_RELEASE_TABLET | Freq: Two times a day (BID) | ORAL | 3 refills | Status: DC

## 2020-02-09 NOTE — Progress Notes (Signed)
Chief Complaint: "Problems with ulcers"  HPI:     [name] is a 54 year old female with a past medical history as listed below, known to Dr. Dr. Rhea Belton, who returns to clinic today for complaint of "problems with my ulcers".    04/06/2017 EGD with normal mucosa in the entire esophagus, gastritis and duodenal ulcers with duodenitis, restarted on pantoprazole 40 twice a day.    10/31/2018 patient seen in clinic for nausea and epigastric pain.  She was restarted on pantoprazole 40 mg twice a day and her Linzess 72 mcg is refilled for IBS-C.  Also given Zofran 4 mg.    08/26/2019 patient seen in clinic and described more epigastric pain.  She had not been taking her pantoprazole because "sometimes I does get tired of medicine".  She continues chronic nausea and use Zofran 4 mg every 4-6 hours as needed, typically 2 a day.  She was not eating her Linzess anymore and was having daily bowel movements.  At that time schedule the patient for screening colonoscopy and told her to stay on pantoprazole 40 twice a day.  Patient never did her screening colonoscopy.    12/24/2019 patient seen by PCP for GERD.  They discussed her recent significant abdominal pain.  She had an H. pylori breath test.  Continued on pantoprazole 40 mg twice a day.  She was also started empirically on amoxicillin and clarithromycin.  Her H. pylori breath test was negative.    01/15/2020 CBC and CMP normal.  Urine tox screen showed positive for cannabis.    Today, the patient presents clinic and tells me that she has had worsened nausea, vomiting and epigastric pain over the past few months.  Apparently her PCP treated with her for possible H. pylori back in October and patient tells me that she took 3 or 4 days of these antibiotics before being told that her H. pylori test was negative, tells me that symptoms actually seem somewhat better after taking this antibiotic for a few days.  She continues to blame her symptoms on "my ulcers".  She  continues Pantoprazole 40 mg twice a day.    Patient requires Zofran 8 mg 1-2 times a day chronically for her nausea.  Did discuss that her recent urine tox screen was positive for marijuana.  Patient tells me she started this in the past year and has had this nausea longer than that.  Symptoms have not worsened since she has been using.    Patient's constipation controlled with as needed Linzess.    Denies fever, chills, blood in her stool or weight loss.  Past Medical History:  Diagnosis Date  . Anxiety   . Chronic headaches   . Common bile duct stone   . Depression   . Gall stones   . H. pylori infection   . Hiatal hernia   . Migraines   . Plantar fasciitis, bilateral   . Schatzki's ring     Past Surgical History:  Procedure Laterality Date  . CHOLECYSTECTOMY  06/09/2011   Procedure: LAPAROSCOPIC CHOLECYSTECTOMY;  Surgeon: Liz Malady, MD;  Location: [facility] OR;  Service: General;  Laterality: N/A;  . DILATION AND CURETTAGE OF UTERUS    . ERCP  06/08/2011   Procedure: ENDOSCOPIC RETROGRADE CHOLANGIOPANCREATOGRAPHY (ERCP);  Surgeon: Theda Belfast, MD;  Location: [facility] ENDOSCOPY;  Service: Endoscopy;  Laterality: N/A;  . TONSILLECTOMY      Current Outpatient Medications  Medication Sig Dispense Refill  . linaclotide (LINZESS) 72 MCG capsule  Take 72 mcg by mouth daily before breakfast.    . ondansetron (ZOFRAN ODT) 4 MG disintegrating tablet Take 1 tablet (4 mg total) by mouth every 6 (six) hours as needed for nausea or vomiting. 60 tablet 11  . ondansetron (ZOFRAN) 4 MG tablet Take 1 tablet (4 mg total) by mouth every 6 (six) hours as needed for nausea or vomiting. 60 tablet 11  . pantoprazole (PROTONIX) 40 MG tablet Take 1 tablet (40 mg total) by mouth 2 (two) times daily before a meal. 180 tablet 3  . promethazine (PHENERGAN) 25 MG tablet Take 1 tablet (25 mg total) by mouth every 6 (six) hours as needed for nausea or vomiting. May make you sleepy 20 tablet 2   No current  facility-administered medications for this visit.    Allergies as of 02/09/2020 - Review Complete 08/26/2019  Allergen Reaction Noted  . Bactrim [sulfamethoxazole-trimethoprim] Rash and Other (See Comments) 11/04/2015  . Sulfa antibiotics Rash 07/30/2019    Family History  Problem Relation Age of Onset  . Diabetes Mother   . Heart disease Mother        AMI  . Heart failure Father   . Heart disease Father        CHF with defibrillator.  . Hypertension Sister   . Polycystic ovary syndrome Daughter   . Heart disease Maternal Grandmother 50  . Heart disease Maternal Grandfather 81    Social History   Socioeconomic History  . Marital status: Legally Separated    Spouse name: Loraine Leriche  . Number of children: Not on file  . Years of education: 12th  . Highest education level: Not on file  Occupational History  . Occupation: Merchandiser, retail: [employer]  . Occupation: Waitress    Comment: [employer]  Tobacco Use  . Smoking status: Never Smoker  . Smokeless tobacco: Never Used  Vaping Use  . Vaping Use: Never used  Substance and Sexual Activity  . Alcohol use: Yes    Comment: occasionally, 1/month  . Drug use: No  . Sexual activity: Yes    Birth control/protection: None    Comment: no menstruation in "years."  Other Topics Concern  . Not on file  Social History Narrative   Marital status: Married      Children: 3 children      Lives: with daughter and husband      Employment: works at [employer] in [place]      Tobacco; none      Alcohol:  Socially; weekends.      Drugs: none     Exercise: none   Social Determinants of Health   Financial Resource Strain: Not on file  Food Insecurity: Not on file  Transportation Needs: Not on file  Physical Activity: Not on file  Stress: Not on file  Social Connections: Not on file  Intimate Partner Violence: Not on file    Review of Systems:    Constitutional: No weight loss, fever or chills Cardiovascular: No chest  pain Respiratory: No SOB Gastrointestinal: See HPI and otherwise negative   Physical Exam:  Vital signs: BP 102/72   Pulse 82   Ht 5\' 2"  (1.575 m)   Wt 114 lb 3.2 oz (51.8 kg)   SpO2 99%   BMI 20.89 kg/m   Constitutional:   Pleasant Caucasian female appears to be in NAD, Well developed, Well nourished, alert and cooperative Respiratory: Respirations even and unlabored. Lungs clear to auscultation bilaterally.   No wheezes, crackles,  or rhonchi.  Cardiovascular: Normal S1, S2. No MRG. Regular rate and rhythm. No peripheral edema, cyanosis or pallor.  Gastrointestinal:  Soft, nondistended, mild right sided and left sided ttp, No rebound or guarding. Normal bowel sounds. No appreciable masses or hepatomegaly. Rectal:  Not performed.  Psychiatric: Demonstrates good judgement and reason without abnormal affect or behaviors.  RELEVANT LABS AND IMAGING: CBC    Component Value Date/Time   WBC 8.6 07/30/2019 1106   RBC 4.54 07/30/2019 1106   HGB 14.6 07/30/2019 1106   HCT 43.7 07/30/2019 1106   PLT 232 07/30/2019 1106   MCV 96.3 07/30/2019 1106   MCV 90.2 12/20/2015 1230   MCH 32.2 07/30/2019 1106   MCHC 33.4 07/30/2019 1106   RDW 12.8 07/30/2019 1106   LYMPHSABS 2.2 07/30/2019 1106   MONOABS 0.5 07/30/2019 1106   EOSABS 0.1 07/30/2019 1106   BASOSABS 0.1 07/30/2019 1106    CMP     Component Value Date/Time   NA 141 07/30/2019 1106   K 3.3 (L) 07/30/2019 1106   CL 103 07/30/2019 1106   CO2 27 07/30/2019 1106   GLUCOSE 80 07/30/2019 1106   BUN 8 07/30/2019 1106   CREATININE 0.64 07/30/2019 1106   CREATININE 0.59 07/29/2018 1423   CALCIUM 10.0 07/30/2019 1106   PROT 7.0 07/30/2019 1106   ALBUMIN 4.1 07/30/2019 1106   AST 20 07/30/2019 1106   ALT 17 07/30/2019 1106   ALKPHOS 52 07/30/2019 1106   BILITOT 0.8 07/30/2019 1106   GFRNONAA >60 07/30/2019 1106   GFRAA >60 07/30/2019 1106    Assessment: 1.  Screening for colorectal cancer: Patient has never had screening  colonoscopy 2.  Epigastric pain: Worse over the past 2 months per patient, history of duodenal ulcers in the past, recently tested negative for H. pylori but the breath test in October; consider gastritis versus other 3.  GERD 4.  Nausea and vomiting: Chronic for the patient 5.  History of duodenal ulcers  Plan: 1.  Recommend patient repeat diagnostic EGD at this time due to ongoing symptoms and history of duodenal ulcers.  We will also add a screening colonoscopy.  This is to be performed by Dr. Rhea Belton.  Did discuss that his schedule is out into February for a double, but these symptoms are chronic for the patient, so this will be okay for her.  Patient given a handout stating detailed risks of the procedure and she agrees to proceed. 2.  Continue Pantoprazole 40 mg twice daily, 30-60 minutes before breakfast and dinner #60 with 5 refills. 3.  Continue Zofran 8 mg ODT every 4-6 hours as needed for nausea #60 with 3 refills. 4.  Patient to follow in clinic per recommendations from Dr. Rhea Belton after time of procedures.  Roberta Meeker, PA-C [facility] 02/09/2020, 3:37 PM  Cc: Anne Ng, NP   Addendum: 812/14/21  After I was done seeing this patient and left the room at time of scheduling her procedures she declined a colonoscopy stating that she did not think her stomach could handle the bowel prep.  At this time proceeding with just an EGD.  It is still recommended that she have a colonoscopy for colon cancer screening.  Roberta Meeker, PA-C

## 2020-02-09 NOTE — Progress Notes (Signed)
Addendum: Reviewed and agree with assessment and management plan. Phillippe Orlick M, MD  

## 2020-02-09 NOTE — Patient Instructions (Addendum)
If you are age 52 or older, your body mass index should be between 23-30. Your Body mass index is 20.89 kg/m. If this is out of the aforementioned range listed, please consider follow up with your Primary Care Provider.  If you are age 45 or younger, your body mass index should be between 19-25. Your Body mass index is 20.89 kg/m. If this is out of the aformentioned range listed, please consider follow up with your Primary Care Provider.   You have been scheduled for an endoscopy . Please follow the written instructions given to you at your visit today.  If you use inhalers (even only as needed), please bring them with you on the day of your procedure.   It has been recommended to you by your physician that you have a(n) Colonoscopy completed. Per your request, we did not schedule the procedure(s) today. Please contact our office at [phone] should you decide to have the procedure completed. You will be scheduled for a pre-visit and procedure at that time.   Thank you for choosing me and [facility].  Hyacinth Meeker, PA-C

## 2020-03-05 ENCOUNTER — Other Ambulatory Visit: Payer: Self-pay | Admitting: Physician Assistant

## 2020-03-05 DIAGNOSIS — Z8619 Personal history of other infectious and parasitic diseases: Secondary | ICD-10-CM

## 2020-04-05 ENCOUNTER — Ambulatory Visit (AMBULATORY_SURGERY_CENTER): Payer: BC Managed Care – PPO | Admitting: Internal Medicine

## 2020-04-05 ENCOUNTER — Encounter: Payer: Self-pay | Admitting: Internal Medicine

## 2020-04-05 ENCOUNTER — Other Ambulatory Visit: Payer: Self-pay

## 2020-04-05 VITALS — BP 133/67 | HR 86 | Temp 97.0°F | Resp 19 | Ht 62.0 in | Wt 114.0 lb

## 2020-04-05 DIAGNOSIS — K222 Esophageal obstruction: Secondary | ICD-10-CM | POA: Diagnosis not present

## 2020-04-05 DIAGNOSIS — K259 Gastric ulcer, unspecified as acute or chronic, without hemorrhage or perforation: Secondary | ICD-10-CM | POA: Diagnosis not present

## 2020-04-05 DIAGNOSIS — K2970 Gastritis, unspecified, without bleeding: Secondary | ICD-10-CM

## 2020-04-05 DIAGNOSIS — K2950 Unspecified chronic gastritis without bleeding: Secondary | ICD-10-CM | POA: Diagnosis not present

## 2020-04-05 DIAGNOSIS — K3189 Other diseases of stomach and duodenum: Secondary | ICD-10-CM | POA: Diagnosis not present

## 2020-04-05 DIAGNOSIS — K219 Gastro-esophageal reflux disease without esophagitis: Secondary | ICD-10-CM | POA: Diagnosis not present

## 2020-04-05 DIAGNOSIS — R1013 Epigastric pain: Secondary | ICD-10-CM

## 2020-04-05 MED ORDER — SODIUM CHLORIDE 0.9 % IV SOLN
500.0000 mL | Freq: Once | INTRAVENOUS | Status: DC
Start: 2020-04-05 — End: 2020-04-05

## 2020-04-05 MED ORDER — METOCLOPRAMIDE HCL 10 MG PO TABS
10.0000 mg | ORAL_TABLET | Freq: Three times a day (TID) | ORAL | 2 refills | Status: DC
Start: 2020-04-05 — End: 2020-05-31

## 2020-04-05 NOTE — Progress Notes (Signed)
Called to room to assist during endoscopic procedure.  Patient ID and intended procedure confirmed with present staff. Received instructions for my participation in the procedure from the performing physician.  

## 2020-04-05 NOTE — Patient Instructions (Signed)
YOU HAD AN ENDOSCOPIC PROCEDURE TODAY AT THE [facility]:   Refer to the procedure report that was given to you for any specific questions about what was found during the examination.  If the procedure report does not answer your questions, please call your gastroenterologist to clarify.  If you requested that your care partner not be given the details of your procedure findings, then the procedure report has been included in a sealed envelope for you to review at your convenience later.  YOU SHOULD EXPECT: Some feelings of bloating in the abdomen. Passage of more gas than usual.  Walking can help get rid of the air that was put into your GI tract during the procedure and reduce the bloating. If you had a lower endoscopy (such as a colonoscopy or flexible sigmoidoscopy) you may notice spotting of blood in your stool or on the toilet paper. If you underwent a bowel prep for your procedure, you may not have a normal bowel movement for a few days.  Please Note:  You might notice some irritation and congestion in your nose or some drainage.  This is from the oxygen used during your procedure.  There is no need for concern and it should clear up in a day or so.  SYMPTOMS TO REPORT IMMEDIATELY:    Following upper endoscopy (EGD)  Vomiting of blood or coffee ground material  New chest pain or pain under the shoulder blades  Painful or persistently difficult swallowing  New shortness of breath  Fever of 100F or higher  Black, tarry-looking stools  For urgent or emergent issues, a gastroenterologist can be reached at any hour by calling [phone]. Do not use MyChart messaging for urgent concerns.    DIET:  We do recommend a small meal at first, but then you may proceed to your regular diet.  Drink plenty of fluids but you should avoid alcoholic beverages for 24 hours.  ACTIVITY:  You should plan to take it easy for the rest of today and you should NOT DRIVE or use heavy machinery  until tomorrow (because of the sedation medicines used during the test).    FOLLOW UP: Our staff will call the number listed on your records 48-72 hours following your procedure to check on you and address any questions or concerns that you may have regarding the information given to you following your procedure. If we do not reach you, we will leave a message.  We will attempt to reach you two times.  During this call, we will ask if you have developed any symptoms of COVID 19. If you develop any symptoms (ie: fever, flu-like symptoms, shortness of breath, cough etc.) before then, please call [phone].  If you test positive for Covid 19 in the 2 weeks post procedure, please call and report this information to us.    If any biopsies were taken you will be contacted by phone or by letter within the next 1-3 weeks.  Please call us at [phone] if you have not heard about the biopsies in 3 weeks.    SIGNATURES/CONFIDENTIALITY: You and/or your care partner have signed paperwork which will be entered into your electronic medical record.  These signatures attest to the fact that that the information above on your After Visit Summary has been reviewed and is understood.  Full responsibility of the confidentiality of this discharge information lies with you and/or your care-partner. 

## 2020-04-05 NOTE — Progress Notes (Signed)
Vs by CW 

## 2020-04-05 NOTE — Progress Notes (Signed)
A/ox3, pleased with MAC, report to RN 

## 2020-04-05 NOTE — Op Note (Signed)
[facility] Patient Name: [name] Procedure Date: 04/05/2020 9:17 AM MRN: [id number] Endoscopist: Beverley Fiedler , MD Age: 55 Referring MD:  Date of Birth: [birth year] Gender: Female Account #: [id number] Procedure:                Upper GI endoscopy Indications:              Epigastric abdominal pain, Gastro-esophageal reflux                            disease, Nausea Medicines:                Monitored Anesthesia Care Procedure:                Pre-Anesthesia Assessment:                           - Prior to the procedure, a History and Physical                            was performed, and patient medications and                            allergies were reviewed. The patient's tolerance of                            previous anesthesia was also reviewed. The risks                            and benefits of the procedure and the sedation                            options and risks were discussed with the patient.                            All questions were answered, and informed consent                            was obtained. Prior Anticoagulants: The patient has                            taken no previous anticoagulant or antiplatelet                            agents. ASA Grade Assessment: II - A patient with                            mild systemic disease. After reviewing the risks                            and benefits, the patient was deemed in                            satisfactory condition to undergo the procedure.  After obtaining informed consent, the endoscope was                            passed under direct vision. Throughout the                            procedure, the patient's blood pressure, pulse, and                            oxygen saturations were monitored continuously. The                            Endoscope was introduced through the mouth, and                            advanced to the second part of duodenum.  The upper                            GI endoscopy was accomplished without difficulty.                            The patient tolerated the procedure well. Scope In: Scope Out: Findings:                 A non-obstructing Schatzki ring was found at the                            gastroesophageal junction. The Z-line is at 34 cm                            from the incisors.                           A 3 cm hiatal hernia was present (located 34-37 cm                            from the incisors).                           A large amount of food (residue) was found in the                            gastric body. This is consistent with gastroparesis.                           Mild inflammation characterized by congestion                            (edema) and erythema was found in the gastric body                            and in the gastric antrum. Biopsies were taken with  a cold forceps for histology and Helicobacter                            pylori testing.                           The examined duodenum was normal. Complications:            No immediate complications. Estimated Blood Loss:     Estimated blood loss was minimal. Impression:               - Non-obstructing Schatzki ring.                           - 3 cm hiatal hernia.                           - A large amount of food (residue) in the stomach.                            This is consistent with gastroparesis and likely                            explains recent symptoms.                           - Gastritis. Biopsied.                           - Normal examined duodenum. Recommendation:           - Patient has a contact number available for                            emergencies. The signs and symptoms of potential                            delayed complications were discussed with the                            patient. Return to normal activities tomorrow.                            Written  discharge instructions were provided to the                            patient.                           - Gastroparesis diet.                           - Continue present medications.                           - Add metoclopramide 10 mg TID-AC and HS.                           -  Office follow-up with me or APP in 6 weeks.                           - Await pathology results. Beverley Fiedler, MD 04/05/2020 9:49:22 AM This report has been signed electronically.

## 2020-04-07 ENCOUNTER — Telehealth: Payer: Self-pay | Admitting: *Deleted

## 2020-04-07 ENCOUNTER — Telehealth: Payer: Self-pay

## 2020-04-07 NOTE — Telephone Encounter (Signed)
Left message on answering machine. 

## 2020-04-07 NOTE — Telephone Encounter (Signed)
  Follow up Call-  Call back number 04/05/2020  Post procedure Call Back phone  # [phone]  Permission to leave phone message Yes  Some recent data might be hidden     No answer at 2nd attempt follow up phone call.  Left message on voicemail.

## 2020-04-09 ENCOUNTER — Encounter: Payer: Self-pay | Admitting: Internal Medicine

## 2020-05-21 ENCOUNTER — Encounter: Payer: Self-pay | Admitting: *Deleted

## 2020-05-27 ENCOUNTER — Telehealth: Payer: Self-pay | Admitting: *Deleted

## 2020-05-27 ENCOUNTER — Encounter: Payer: Self-pay | Admitting: Internal Medicine

## 2020-05-27 ENCOUNTER — Ambulatory Visit: Payer: BC Managed Care – PPO | Admitting: Internal Medicine

## 2020-05-27 VITALS — BP 119/70 | HR 88 | Ht 62.0 in | Wt 116.2 lb

## 2020-05-27 DIAGNOSIS — Z1211 Encounter for screening for malignant neoplasm of colon: Secondary | ICD-10-CM | POA: Diagnosis not present

## 2020-05-27 DIAGNOSIS — K5909 Other constipation: Secondary | ICD-10-CM | POA: Diagnosis not present

## 2020-05-27 DIAGNOSIS — Z8619 Personal history of other infectious and parasitic diseases: Secondary | ICD-10-CM

## 2020-05-27 DIAGNOSIS — K3184 Gastroparesis: Secondary | ICD-10-CM | POA: Diagnosis not present

## 2020-05-27 DIAGNOSIS — Z1212 Encounter for screening for malignant neoplasm of rectum: Secondary | ICD-10-CM

## 2020-05-27 MED ORDER — PANTOPRAZOLE SODIUM 40 MG PO TBEC: 40.0000 mg | DELAYED_RELEASE_TABLET | Freq: Every day | ORAL | 3 refills | Status: DC

## 2020-05-27 NOTE — Patient Instructions (Addendum)
Please follow up with Dr Rhea Belton in  4-6 months.  Please follow the gastroparesis diet we have given you today.  Please decrease your pantoprazole to once daily dosing.  Continue Reglan, linzess, and zofran at current dosages.  If you are age 53 or younger, your body mass index should be between 19-25. Your Body mass index is 21.25 kg/m. If this is out of the aformentioned range listed, please consider follow up with your Primary Care Provider.   Due to recent changes in healthcare laws, you may see the results of your imaging and laboratory studies on MyChart before your provider has had a chance to review them.  We understand that in some cases there may be results that are confusing or concerning to you. Not all laboratory results come back in the same time frame and the provider may be waiting for multiple results in order to interpret others.  Please give [place] 48 hours in order for your provider to thoroughly review all the results before contacting the office for clarification of your results.    Gastroparesis  Gastroparesis is a condition in which food takes longer than normal to empty from the stomach. This condition is also known as delayed gastric emptying. It is usually a long-term (chronic) condition. There is no cure, but there are treatments and things that you can do at home to help relieve symptoms. Treating the underlying condition that causes gastroparesis can also help relieve symptoms. What are the causes? In many cases, the cause of this condition is not known. Possible causes include:  A hormone (endocrine) disorder, such as hypothyroidism or diabetes.  A nervous system disease, such as Parkinson's disease or multiple sclerosis.  Cancer, infection, or surgery that affects the stomach or vagus nerve. The vagus nerve runs from your chest, through your neck, and to the lower part of your brain.  A connective tissue disorder, such as scleroderma.  Certain medicines. What  increases the risk? You are more likely to develop this condition if:  You have certain disorders or diseases. These may include: ? An endocrine disorder. ? An eating disorder. ? Amyloidosis. ? Scleroderma. ? Parkinson's disease. ? Multiple sclerosis. ? Cancer or infection of the stomach or the vagus nerve.  You have had surgery on your stomach or vagus nerve.  You take certain medicines.  You are female. What are the signs or symptoms? Symptoms of this condition include:  Feeling full after eating very little or a loss of appetite.  Nausea, vomiting, or heartburn.  Bloating of your abdomen.  Inconsistent blood sugar (glucose) levels on blood tests.  Unexplained weight loss.  Acid from the stomach coming up into the esophagus (gastroesophageal reflux).  Sudden tightening (spasm) of the stomach, which can be painful. Symptoms may come and go. Some people may not notice any symptoms. How is this diagnosed? This condition is diagnosed with tests, such as:  Tests that check how long it takes food to move through the stomach and intestines. These tests include: ? Upper gastrointestinal (GI) series. For this test, you drink a liquid that shows up well on X-rays, and then X-rays are taken of your intestines. ? Gastric emptying scintigraphy. For this test, you eat food that contains a small amount of radioactive material, and then scans are taken. ? Wireless capsule GI monitoring system. For this test, you swallow a pill (capsule) that records information about how foods and fluid move through your stomach.  Gastric manometry. For this test, a tube is  passed down your throat and into your stomach to measure electrical and muscular activity.  Endoscopy. For this test, a long, thin tube with a camera and light on the end is passed down your throat and into your stomach to check for problems in your stomach lining.  Ultrasound. This test uses sound waves to create images of the  inside of your body. This can help rule out gallbladder disease or pancreatitis as a cause of your symptoms. How is this treated? There is no cure for this condition, but treatment and home care may relieve symptoms. Treatment may include:  Treating the underlying cause.  Managing your symptoms by making changes to your diet and exercise habits.  Taking medicines to control nausea and vomiting and to stimulate stomach muscles.  Getting food through a feeding tube in the hospital. This may be done in severe cases.  Having surgery to insert a device called a gastric electrical stimulator into your body. This device helps improve stomach emptying and control nausea and vomiting. Follow these instructions at home:  Take over-the-counter and prescription medicines only as told by your health care provider.  Follow instructions from your health care provider about eating or drinking restrictions. Your health care provider may recommend that you: ? Eat smaller meals more often. ? Eat low-fat foods. ? Eat low-fiber forms of high-fiber foods. For example, eat cooked vegetables instead of raw vegetables. ? Have only liquid foods instead of solid foods. Liquid foods are easier to digest.  Drink enough fluid to keep your urine pale yellow.  Exercise as often as told by your health care provider.  Keep all follow-up visits. This is important. Contact a health care provider if you:  Notice that your symptoms do not improve with treatment.  Have new symptoms. Get help right away if you:  Have severe pain in your abdomen that does not improve with treatment.  Have nausea that is severe or does not go away.  Vomit every time you drink fluids. Summary  Gastroparesis is a long-term (chronic) condition in which food takes longer than normal to empty from the stomach.  Symptoms include nausea, vomiting, heartburn, bloating of your abdomen, and loss of appetite.  Eating smaller portions,  low-fat foods, and low-fiber forms of high-fiber foods may help you manage your symptoms.  Get help right away if you have severe pain in your abdomen. This information is not intended to replace advice given to you by your health care provider. Make sure you discuss any questions you have with your health care provider. Document Revised: 06/23/2019 Document Reviewed: 06/23/2019 Elsevier Patient Education  2021 [employer].

## 2020-05-27 NOTE — Telephone Encounter (Signed)
-----   Message from Beverley Fiedler, MD sent at 05/27/2020  1:30 PM EDT ----- Patient needs colonoscopy for screening.  It was recommended when she last saw Hyacinth Meeker to be done on the same day as upper endoscopy.  I cannot find out why we did not do it.  We please let her know that I recommend screening colonoscopy and schedule if she is willing. JMP

## 2020-05-27 NOTE — Progress Notes (Signed)
Subjective:    Patient ID: [name], female    DOB: 16-Jul-1965, 55 y.o.   MRN: [id number]  HPI [name] is a 55 year old female with a history of GERD, prior H. pylori infection treated with antibiotics, recent diagnosis of gastroparesis, hiatal hernia who is seen for follow-up.  She is here alone today.  I performed an upper endoscopy on 04/05/2020 to evaluate epigastric abdominal pain, early fullness, weight loss, nausea and GERD.  This revealed nonobstructing Schatzki's ring and a 3 cm hiatal hernia.  There was a large amount of retained food in the stomach consistent with gastroparesis.  There was mild inflammation in the gastric body and antrum which was biopsied and examined duodenum was normal.  Biopsies from the stomach showed mild chronic gastritis.  There was focal IM in the antrum but without dysplasia.  Stains negative for H. Pylori.  After her upper endoscopy we started her on metoclopramide 10 mg 3 times daily before meals and at bedtime.  She reports that this medication has been helpful.  She also has reviewed and tried to follow a gastroparesis diet.  Foods like rice and bread do better for her.  She has not used metoclopramide on a daily basis but averages taking this maybe twice per day.  She has good and bad days.  There is been a couple days since her endoscopy where she has had vomiting but this is significantly less than before.  Her upper abdominal pain is better.  She is using metoprolol twice daily.  She using Linzess 72 mcg a few days a week to help with bowel movements.  It works well when she takes it and she will often have abnormal bowel movement the day after taking it even without redosing.  No blood in stool or melena.   Review of Systems As per HPI, otherwise negative  Current Medications, Allergies, Past Medical History, Past Surgical History, Family History and Social History were reviewed in [employer] record.      Objective:   Physical Exam BP 119/70   Pulse 88   Ht 5\' 2"  (1.575 m)   Wt 116 lb 3.2 oz (52.7 kg)   SpO2 98%   BMI 21.25 kg/m  Gen: awake, alert, NAD HEENT: anicteric  Ext: no c/c/e Neuro: nonfocal      Assessment & Plan:  55 year old female with a history of GERD, prior H. pylori infection treated with antibiotics, recent diagnosis of gastroparesis, hiatal hernia who is seen for follow-up.  1.  Idiopathic gastroparesis --we discussed gastroparesis again today in detail including the pathophysiology and treatment for gastroparesis.  We discussed metoclopramide therapy and we also discussed the potential for neurologic complications including tardive dyskinesia.  She should use the lowest effective dose, take medication holiday and follow gastroparesis diet.  There is no definitive etiology for her gastroparesis and we discussed how it may not be a lifelong problem. --Gastroparesis diet again reviewed today in detail --Continue metoclopramide 5 to 10 mg 3 times daily before meals and at bedtime --Okay to supplement with protein shakes/Ensure when she is not eating full meals --She can use Zofran 8 mg every 8 hours as needed for nausea.  2.  History of H. Pylori --H. pylori negative by recent biopsy indicating previous successful eradication.  She did have focal intestinal metaplasia which we can discuss further in the future.  We could consider repeat EGD in the future for gastric mapping.  No family history of stomach cancer.  3.  Chronic constipation --Linzess is helping her significantly and she is not needing this on a daily basis.  We will continue current dose --Linzess 72 mcg daily  4.  Colon cancer screening --she has never had colonoscopy and colonoscopy is recommended for screening.  This needs to be arranged.  30 minutes total spent today including patient facing time, coordination of care, reviewing medical history/procedures/pertinent radiology studies, and documentation  of the encounter.

## 2020-05-27 NOTE — Telephone Encounter (Signed)
I have contacted patient to advise of recommendation for colonoscopy. However, patient states "yall have tried to get me to schedule one a few times but Ive backed out on yall." I did attempt to have her schedule colonoscopy and reminded her that she does need this procedure so we can look for any colon polyps that may later turn into colon cancer or for colon cancers. She declines at this time. Patient is advised to call us back should she change her mind.

## 2020-05-31 ENCOUNTER — Telehealth: Payer: Self-pay | Admitting: Internal Medicine

## 2020-05-31 MED ORDER — METOCLOPRAMIDE HCL 10 MG PO TABS: 10.0000 mg | ORAL_TABLET | Freq: Three times a day (TID) | ORAL | 2 refills | Status: DC

## 2020-05-31 MED ORDER — PANTOPRAZOLE SODIUM 40 MG PO TBEC: 40.0000 mg | DELAYED_RELEASE_TABLET | Freq: Every day | ORAL | 1 refills | Status: DC

## 2020-05-31 MED ORDER — LINACLOTIDE 72 MCG PO CAPS: 72.0000 ug | ORAL_CAPSULE | Freq: Every day | ORAL | 1 refills | Status: DC

## 2020-05-31 NOTE — Telephone Encounter (Signed)
Patient requesting refill on Pantoprazole, Reglan and Linzess

## 2020-05-31 NOTE — Telephone Encounter (Signed)
Rxs sent

## 2020-06-08 ENCOUNTER — Other Ambulatory Visit: Payer: Self-pay | Admitting: Physician Assistant

## 2020-07-27 ENCOUNTER — Other Ambulatory Visit: Payer: Self-pay | Admitting: Physician Assistant

## 2020-08-23 ENCOUNTER — Other Ambulatory Visit: Payer: Self-pay | Admitting: Physician Assistant

## 2020-08-24 ENCOUNTER — Telehealth: Payer: Self-pay | Admitting: Internal Medicine

## 2020-08-24 NOTE — Telephone Encounter (Signed)
Inbound call from patient. States can zofran get refills so she does not have to call every month.

## 2020-08-24 NOTE — Telephone Encounter (Signed)
Patient was given one refill today by Hyacinth Meeker, PA-C's CMA. However, for additional refills, she will need to schedule follow up office visit as we are already scheduling into late August. J.Lemmon, PA-C requested patient to have follow up 4-6 months after her last visit 05/27/20.

## 2020-08-31 NOTE — Telephone Encounter (Signed)
Left vm to return call.    

## 2020-09-19 ENCOUNTER — Other Ambulatory Visit: Payer: Self-pay | Admitting: Physician Assistant

## 2020-09-22 ENCOUNTER — Other Ambulatory Visit: Payer: Self-pay

## 2020-09-22 ENCOUNTER — Telehealth: Payer: Self-pay | Admitting: Internal Medicine

## 2020-09-22 MED ORDER — ONDANSETRON 8 MG PO TBDP: 8.0000 mg | ORAL_TABLET | Freq: Three times a day (TID) | ORAL | 0 refills | Status: DC | PRN

## 2020-09-22 NOTE — Telephone Encounter (Signed)
I have sent patient enough medication to get her through until her appointment 09/29/20.

## 2020-09-22 NOTE — Telephone Encounter (Signed)
As per note on 08/24/20, "However, for additional refills, she will need to schedule follow up office visit as we are already scheduling into late August. J.Lemmon, PA-C requested patient to have follow up 4-6 months after her last visit 05/27/20.".   Looks like patient was contacted to schedule appointment but she never called back. Unfortunately, no medication refills to be given until she comes for a visit. Thx

## 2020-09-27 DIAGNOSIS — K219 Gastro-esophageal reflux disease without esophagitis: Secondary | ICD-10-CM | POA: Insufficient documentation

## 2020-09-28 NOTE — Progress Notes (Signed)
09/29/2020 [name] [id number] [birth year]   Chief Complaint: Refill Zofran   History of Present Illness:   [name] is a 55 year old female with a past medical history of GERD, duodenal ulcers, positive H. pylori IgG antibody in 2018, hiatal hernia and gastroparesis.  Past cholecystectomy. She presents to our office today to refill her Zofran prescription.   She was last seen in office by Dr. Rhea Belton on 05/27/2020 for gastroparesis follow-up.  At that time, she was prescribed Metoclopramide 5 to 10 mg 3 times daily before every meal and nightly and she was counseled regarding the risks of it if dyskinesia associated with Metoclopramide.  She reported taking the Metoclopramide for few days then decided not to take it as she was concerned about the potential risk of tardive dyskinesia.  Since that time, she takes Zofran most days.  She stated she sometimes "takes Zofran like candy".  She has increased stress and anxiety at work and has worsening nausea on the days she works.  She takes Zofran ODT 8 mg 1 tab 3-4 times daily 3 days weekly and up to 6 tabs daily once or twice weekly.  When she is off work on the weekends, she takes 1 or 2 doses of Zofran or sometimes doesn't need to take any.  She intermittently vomits.  She vomited Tuesday 7/26 around 10:30 PM, vomited up partially digested food.  The next day she vomited nonbloody bilious emesis.  No further vomiting since that time.  No history of hematemesis. She underwent an EGD 04/05/2020 which identified a 3 cm hiatal hernia, a nonobstructing Schatzki's ring and a large amount of food residue was in the stomach consistent with gastroparesis.  She reports participating in an online gastroparesis support group.  She eats 2 meals daily.  She smokes marijuana daily for at least the past year.  She has chronic constipation for which she takes Linzess 5 days weekly.  She often skips the Linzess on the weekends when she is home.  She typically  passes a brown soft to loose bowel movement daily.  She intermittently sees bright red blood on the toilet tissue which last occurred approximately 6 months ago. At the time of her last office visit with Dr. Rhea Belton a screening colonoscopy was recommended but she declined to schedule this procedure.  Her weight today is 110 pounds which she stated was stable for at least the past 6 months.  She complains of having intermittent dizziness for the past 6 months and fatigue for the past 6 to 12 months.  She complains of having generalized itchiness and sweats when feels hot.  Her last sickle exam with her PCP was more than 1 year ago.  CBC Latest Ref Rng & Units 09/29/2020 07/30/2019 07/29/2018  WBC 4.0 - 10.5 K/uL 7.9 8.6 8.3  Hemoglobin 12.0 - 15.0 g/dL 16.6 06.3 01.6  Hematocrit 36.0 - 46.0 % 43.8 43.7 43.9  Platelets 150.0 - 400.0 K/uL 249.0 232 210    CMP Latest Ref Rng & Units 09/29/2020 07/30/2019 07/29/2018  Glucose 70 - 99 mg/dL 82 80 81  BUN 6 - 23 mg/dL 7 8 15   Creatinine 0.40 - 1.20 mg/dL 0.10 9.32  Sodium 135 - 145 mEq/L 141 141 138  Potassium 3.5 - 5.1 mEq/L 4.0 3.3(L) 3.8  Chloride 96 - 112 mEq/L 104 103 105  CO2 19 - 32 mEq/L 29 27 26   Calcium 8.4 - 10.5 mg/dL 3.55 73.2  Total Protein 6.0 -  8.3 g/dL 7.2 7.0 6.8  Total Bilirubin 0.2 - 1.2 mg/dL 0.3 0.8 0.4  Alkaline Phos 39 - 117 U/L 69 52 -  AST 0 - 37 U/L 13 20 20   ALT 0 - 35 U/L 8 17 18      EGD 04/05/2020: - Non-obstructing Schatzki ring. - 3 cm hiatal hernia. - A large amount of food (residue) in the stomach. This is consistent with gastroparesis and likely explains recent symptoms. - Gastritis. Biopsied. - Normal examined duodenum. Surgical [P], gastric - GASTRIC ANTRAL AND OXYNTIC MUCOSA WITH MILD CHRONIC GASTRITIS. - INTESTINAL METAPLASIA IS PRESENT FOCALLY IN ANTRUM, NEGATIVE FOR DYSPLASIA. - WARTHIN-STARRY STAIN IS NEGATIVE FOR HELICOBACTER PYLORI.  EGD 04/06/2017: - Normal mucosa was found in the entire  esophagus. - 2 cm hiatal hernia. - Gastritis. Biopsied. - Duodenal ulcers with duodenitis. - Normal second portion of the duodenum. - CHRONIC GASTRITIS. - NEGATIVE FOR HELICOBACTER PYLORI. - NO INTESTINAL METAPLASIA, DYSPLASIA, OR MALIGNANCY.  Current Outpatient Medications on File Prior to Visit  Medication Sig Dispense Refill   linaclotide (LINZESS) 72 MCG capsule Take 1 capsule (72 mcg total) by mouth daily before breakfast. 90 capsule 1   pantoprazole (PROTONIX) 40 MG tablet Take 1 tablet (40 mg total) by mouth daily. 180 tablet 1   No current facility-administered medications on file prior to visit.   Allergies  Allergen Reactions   Bactrim [Sulfamethoxazole-Trimethoprim] Rash and Other (See Comments)    Pt states she developed a rash on arms, throat soreness, headache and ear pain.    Sulfa Antibiotics Rash    Current Medications, Allergies, Past Medical History, Past Surgical History, Family History and Social History were reviewed in 06/03/2020 record.  Review of Systems:   Constitutional: Negative for fever, sweats, chills or weight loss.  Respiratory: Negative for shortness of breath.   Cardiovascular: Negative for chest pain, palpitations and leg swelling.  Gastrointestinal: See HPI.  Musculoskeletal: Negative for back pain or muscle aches.  Neurological: Negative for dizziness, headaches or paresthesias.   Physical Exam: BP 100/64   Pulse 66   Ht 5\' 2"  (1.575 m)   Wt 110 lb (49.9 kg)   SpO2 98%   BMI 20.12 kg/m  General: 55 year old female briefly tearful in NAD.  Head: Normocephalic and atraumatic. Eyes: No scleral icterus. Conjunctiva pink . Ears: Normal auditory acuity. Mouth: Dentition intact. No ulcers or lesions.  Lungs: Clear throughout to auscultation. Heart: Regular rate and rhythm, no murmur. Abdomen: Soft, nondistended. Mild epigastric tenderness without rebound or guarding. No masses or hepatomegaly. Normal bowel sounds x  4 quadrants.  Rectal: Deferred.  Musculoskeletal: Symmetrical with no gross deformities. Extremities: No edema. Neurological: Alert oriented x 4. No focal deficits.  Psychological: Alert and cooperative. Normal mood and affect  Assessment and Recommendations:  55 year old female with idiopathic gastroparesis.  EGD 04/05/2020 showed a large amount of food residual in the stomach consistent with gastroparesis.  Chronic marijuana use. -Zofran 8mg  ODT one tab dissolve on tongue Q 8 hours as needed, patient counseled not to take more than 3 Zofran tablets in 24 hours -FDgard 2 tab po bid -Patient wishes to avoid Metoclopramide -Recommended 3-4 small snacks sized meals -I discussed with [name] that chronic marijuana use may be worsening her N/V symptoms. Recommended reducing marijuana use with eventual goal of complete cessation -Gastroparesis diet handout -Nutritionist referral  GERD. EGD 04/05/2020 chronic mild gastritis without evidence of Helicobacter pylori, antral biopsies positive for intestinal metaplasia without dysplasia, a 3  cm hiatal hernia and a nonobstructive Schatzki's ring.  No known family history of gastric cancer. -Continue pantoprazole 40 mg daily -Eventual repeat EGD to reassess antral intestinal metaplasia, timing to be verified by Dr. Rhea Belton  Chronic constipation -Continue Linzess 72 mcg daily as needed  Screening colonoscopy. Intermittent rectal bleeding.  -I discussed the colonoscopy procedure/sedation in full detail. Colonoscopy benefits and risks discussed including risk with sedation, risk of bleeding, perforation and infection.  She wishes to schedule an early morning colonoscopy.  Anxiety/heightened stress level -Recommend follow up/further evaluation/treatment with PCP  Fatigue/dizziness -Follow-up with PCP, schedule annual physical exam

## 2020-09-29 ENCOUNTER — Other Ambulatory Visit (INDEPENDENT_AMBULATORY_CARE_PROVIDER_SITE_OTHER): Payer: BC Managed Care – PPO

## 2020-09-29 ENCOUNTER — Ambulatory Visit: Payer: BC Managed Care – PPO | Admitting: Nurse Practitioner

## 2020-09-29 ENCOUNTER — Encounter: Payer: Self-pay | Admitting: Nurse Practitioner

## 2020-09-29 VITALS — BP 100/64 | HR 66 | Ht 62.0 in | Wt 110.0 lb

## 2020-09-29 DIAGNOSIS — K3184 Gastroparesis: Secondary | ICD-10-CM | POA: Diagnosis not present

## 2020-09-29 DIAGNOSIS — Z1212 Encounter for screening for malignant neoplasm of rectum: Secondary | ICD-10-CM

## 2020-09-29 DIAGNOSIS — R112 Nausea with vomiting, unspecified: Secondary | ICD-10-CM | POA: Diagnosis not present

## 2020-09-29 DIAGNOSIS — F411 Generalized anxiety disorder: Secondary | ICD-10-CM

## 2020-09-29 DIAGNOSIS — Z1211 Encounter for screening for malignant neoplasm of colon: Secondary | ICD-10-CM | POA: Diagnosis not present

## 2020-09-29 LAB — CBC WITH DIFFERENTIAL/PLATELET
Basophils Absolute: 0.1 10*3/uL (ref 0.0–0.1)
Basophils Relative: 1 % (ref 0.0–3.0)
Eosinophils Absolute: 0.3 10*3/uL (ref 0.0–0.7)
Eosinophils Relative: 3.4 % (ref 0.0–5.0)
HCT: 43.8 % (ref 36.0–46.0)
Hemoglobin: 14.8 g/dL (ref 12.0–15.0)
Lymphocytes Relative: 27.7 % (ref 12.0–46.0)
Lymphs Abs: 2.2 10*3/uL (ref 0.7–4.0)
MCHC: 33.9 g/dL (ref 30.0–36.0)
MCV: 97.4 fl (ref 78.0–100.0)
Monocytes Absolute: 0.6 10*3/uL (ref 0.1–1.0)
Monocytes Relative: 7.6 % (ref 3.0–12.0)
Neutro Abs: 4.8 10*3/uL (ref 1.4–7.7)
Neutrophils Relative %: 60.3 % (ref 43.0–77.0)
Platelets: 249 10*3/uL (ref 150.0–400.0)
RBC: 4.5 Mil/uL (ref 3.87–5.11)
RDW: 13.1 % (ref 11.5–15.5)
WBC: 7.9 10*3/uL (ref 4.0–10.5)

## 2020-09-29 LAB — COMPREHENSIVE METABOLIC PANEL
ALT: 8 U/L (ref 0–35)
AST: 13 U/L (ref 0–37)
Albumin: 4.1 g/dL (ref 3.5–5.2)
Alkaline Phosphatase: 69 U/L (ref 39–117)
BUN: 7 mg/dL (ref 6–23)
CO2: 29 mEq/L (ref 19–32)
Calcium: 10.1 mg/dL (ref 8.4–10.5)
Chloride: 104 mEq/L (ref 96–112)
Creatinine, Ser: 0.63 mg/dL (ref 0.40–1.20)
GFR: 101.41 mL/min (ref >60.00)
Glucose, Bld: 82 mg/dL (ref 70–99)
Potassium: 4 mEq/L (ref 3.5–5.1)
Sodium: 141 mEq/L (ref 135–145)
Total Bilirubin: 0.3 mg/dL (ref 0.2–1.2)
Total Protein: 7.2 g/dL (ref 6.0–8.3)

## 2020-09-29 LAB — TSH: TSH: 0.76 u[IU]/mL (ref 0.35–5.50)

## 2020-09-29 MED ORDER — ONDANSETRON 8 MG PO TBDP: 8.0000 mg | ORAL_TABLET | Freq: Three times a day (TID) | ORAL | 1 refills | Status: DC | PRN

## 2020-09-29 MED ORDER — SUTAB 1479-225-188 MG PO TABS: 1.0000 | ORAL_TABLET | ORAL | 0 refills | Status: DC

## 2020-09-29 NOTE — Patient Instructions (Addendum)
PROCEDURES: You have been scheduled for a colonoscopy. Please follow the written instructions given to you at your visit today. Please pick up your prep supplies at the pharmacy within the next 1-3 days. If you use inhalers (even only as needed), please bring them with you on the day of your procedure.  LABS:  Lab work has been ordered for you today. Our lab is located in the basement. Press "B" on the elevator. The lab is located at the first door on the left as you exit the elevator.  HEALTHCARE LAWS AND MY CHART RESULTS: Due to recent changes in healthcare laws, you may see the results of your imaging and laboratory studies on MyChart before your provider has had a chance to review them.   We understand that in some cases there may be results that are confusing or concerning to you. Not all laboratory results come back in the same time frame and the provider may be waiting for multiple results in order to interpret others.  Please give [place] 48 hours in order for your provider to thoroughly review all the results before contacting the office for clarification of your results.   MEDICATION: We have sent the following medication to your pharmacy for you to pick up at your convenience: Ondansetron (ZOFRAN ODT)   OVER THE COUNTER MEDICATION Please purchase the following medications over the counter and take as directed:  Fdgard, 2 tablets twice a day for nausea ( we have given you some samples today to try)  We place placed a referral to [facility] to [facility] Norm Parcel, RD) Where:[facility] Address:[address] [place] [address] Phone:[phone] Expires:09/29/2021   You should hear from their office in the next couple weeks to schedule an appointment. If you do not hear from them please call them at [phone].  RECOMMENDATIONS: Try to reduce marijuana use with goal of weaning off. Try eating 4 snack size meals  daily. Follow up with your primary care provider for anxiety treatment and annual physical.  It was great seeing you today! Thank you for entrusting me with your care and choosing [facility].  Arnaldo Natal, CRNP  The [facility] providers would like to encourage you to use [facility] to communicate with providers for non-urgent requests or questions.  Due to long hold times on the telephone, sending your provider a message by [facility] may be faster and more efficient way to get a response. Please allow 48 business hours for a response.  Please remember that this is for non-urgent requests/questions.  If you are age 56 or older, your body mass index should be between 23-30. Your Body mass index is 20.12 kg/m. If this is out of the aforementioned range listed, please consider follow up with your Primary Care Provider.  If you are age 86 or younger, your body mass index should be between 19-25. Your Body mass index is 20.12 kg/m. If this is out of the aformentioned range listed, please consider follow up with your Primary Care Provider.

## 2020-10-03 NOTE — Progress Notes (Signed)
Addendum: Reviewed and agree with assessment and management plan. Calaya Gildner M, MD  

## 2020-11-17 ENCOUNTER — Ambulatory Visit: Payer: BC Managed Care – PPO | Admitting: Dietician

## 2020-11-23 DIAGNOSIS — Z8711 Personal history of peptic ulcer disease: Secondary | ICD-10-CM | POA: Diagnosis not present

## 2020-11-23 DIAGNOSIS — K219 Gastro-esophageal reflux disease without esophagitis: Secondary | ICD-10-CM | POA: Diagnosis not present

## 2020-11-23 DIAGNOSIS — R42 Dizziness and giddiness: Secondary | ICD-10-CM | POA: Diagnosis not present

## 2020-11-23 DIAGNOSIS — K3184 Gastroparesis: Secondary | ICD-10-CM | POA: Diagnosis not present

## 2020-11-23 DIAGNOSIS — Z1231 Encounter for screening mammogram for malignant neoplasm of breast: Secondary | ICD-10-CM | POA: Diagnosis not present

## 2020-11-29 ENCOUNTER — Encounter: Payer: BC Managed Care – PPO | Admitting: Internal Medicine

## 2020-12-21 ENCOUNTER — Encounter: Payer: BC Managed Care – PPO | Attending: Nurse Practitioner | Admitting: Dietician

## 2021-02-27 HISTORY — PX: ABLATION: SHX5711

## 2021-02-28 ENCOUNTER — Other Ambulatory Visit: Payer: Self-pay | Admitting: Nurse Practitioner

## 2021-05-04 ENCOUNTER — Other Ambulatory Visit: Payer: Self-pay | Admitting: Nurse Practitioner

## 2021-09-26 ENCOUNTER — Other Ambulatory Visit: Payer: Self-pay | Admitting: Nurse Practitioner

## 2021-12-17 ENCOUNTER — Other Ambulatory Visit: Payer: Self-pay | Admitting: Nurse Practitioner

## 2021-12-19 NOTE — Telephone Encounter (Signed)
Patient needs an appointment for future refills. 

## 2021-12-20 ENCOUNTER — Other Ambulatory Visit: Payer: Self-pay | Admitting: Nurse Practitioner

## 2021-12-22 ENCOUNTER — Telehealth: Payer: Self-pay | Admitting: Nurse Practitioner

## 2021-12-22 MED ORDER — ONDANSETRON 8 MG PO TBDP: 8.0000 mg | ORAL_TABLET | Freq: Three times a day (TID) | ORAL | 0 refills | Status: DC | PRN

## 2021-12-22 NOTE — Telephone Encounter (Signed)
DD, ok to refill Ondansetron ODT 8mg  tab dissolve one tab one tongue Q 8 hours as needed. # 30, no refills. Further refills can be provided at the the time of her follow up appt 01/30/2022. THX

## 2021-12-22 NOTE — Telephone Encounter (Signed)
Patient is requesting refill on Zofran. Has appt with APP 12/4. Please advise.

## 2021-12-27 NOTE — Telephone Encounter (Signed)
Error

## 2021-12-27 NOTE — Telephone Encounter (Signed)
This is a duplicate request. New RX was sent on 12/22/21. Pt already has an appt scheduled with Vicie Mutters, PA-C on Monday, 01/30/22 at 3 pm.

## 2022-01-24 NOTE — Progress Notes (Signed)
01/30/2022 [name] [id number] 06-Nov-1965  Referring provider: Flossie Buffy, NP Primary GI doctor: Dr. Hilarie [name]  ASSESSMENT AND PLAN:   Nausea vomiting and patient with history of EGD 2022 with 3 cm hiatal hernia, stomach contents likely gastroparesis.  Patient also smokes marijuana daily. Refilled Zofran to take up to 3 times a day. Patient not taking Protonix refilled instructed to take once daily for possible hiatal hernia Instructed to quit smoking marijuana, can take up to 9-12 months of cessation.  Can consider amitriptyline Handout given to the patient Check cortisol, lipase  Chronic constipation Possible component of global paresis Continue Linzess 72 mcg take daily. Can consider Motegrity.  Screening for colorectal cancer Patient has never had, is tearful in the room, very anxious in general. This could also be contributing to her symptoms, discussed discussing with primary care. Currently getting evaluated with stress echo mid-January for chest pain, if this is negative would suggest getting colonoscopy with repeat endoscopy to evaluate further.  Anxiety state Likely contributing to her IBS with constipation/nausea and vomiting, suggest talking with primary care about SSRI/SNRI for possible symptoms, and to help patient stop marijuana.  Abdominal pain, unspecified abdominal location, worse epigastric. Some worsening abdominal pain, yet again this could be from Cannabinoid hyperemesis syndrome last CT was 2020, while waiting on stress echo results, will repeat CT abdomen pelvis with contrast for abdominal pain, nausea, vomiting.  Can evaluate previous hiatal hernia, look for duodenitis, obstruction, pancreatitis.  ETC.  Follow-up 6 to 8 weeks, should have stress echo results at that time. Consider endoscopic evaluation with screening colonoscopy which patient has not had and repeat endoscopy for symptoms.  History of Present Illness:  56 y.o. female   with a past medical history of GERD, duodenal ulcers, positive H. pylori IgG antibody in 2018, hiatal hernia and gastroparesis.  Past cholecystectomy.   and others listed below, returns to clinic today for evaluation of nausea.  04/05/2020 EGD which identified a 3 cm hiatal hernia, a nonobstructing Schatzki's ring and a large amount of food residue was in the stomach consistent with gastroparesis  09/29/2020 office visit with [name] for nausea and vomiting Patient has history of chronic constipation takes Linzess 5 days a week.  02/2021 s/p ablation for pSVT 01/10/2022 ER visit for chest pain saw cardiology at [facility],  getting stress echo to rule out ischemia. Scheduled Jan 18th.   She states she canceled a lot of things due to losing insurance, needs to reschedule OSA.  She has nausea daily, takes zofran at least 2 x a day. Had vomiting this morning due to running out of her zofran.  She takes linzess q 5 day, has taken last several days.  She has hot flashes with itching all over.  She joined support group for gastroparesis, she wants to know why she has never had GES. Had EGD with large amount of food.  She is gaining weight.  She is having epigastric discomfort and feels food get stuck mid esophagus.  She states as long as she takes the linzess she can have movement.   Smokes marijuana daily for the past year. Patient was offered screening colonoscopy but declined. Wt Readings from Last 8 Encounters:  01/30/22 122 lb 6.4 oz (55.5 kg)  09/29/20 110 lb (49.9 kg)  05/27/20 116 lb 3.2 oz (52.7 kg)  04/05/20 114 lb (51.7 kg)  02/09/20 114 lb 3.2 oz (51.8 kg)  08/26/19 118 lb 4 oz (53.6 kg)  07/30/19 120 lb (  54.4 kg)  06/09/19 121 lb (54.9 kg)    She  reports that she has never smoked. She has never used smokeless tobacco. She reports current alcohol use. She reports current drug use. Drug: Marijuana. Her family history includes Bone cancer in her maternal uncle; Diabetes in her  mother; Heart disease in her father and mother; Heart disease (age of onset: 34) in her maternal grandfather and maternal grandmother; Heart failure in her father; Hypertension in her sister; Lung cancer in her maternal uncle; Polycystic ovary syndrome in her daughter.   Current Medications:        Current Outpatient Medications (Other):    linaclotide (LINZESS) 72 MCG capsule, Take 1 capsule (72 mcg total) by mouth daily before breakfast.   ondansetron (ZOFRAN-ODT) 8 MG disintegrating tablet, Take 1 tablet (8 mg total) by mouth every 8 (eight) hours as needed for nausea or vomiting. **PLEASE CALL OFFICE TO SCHEDULE FOLLOW UP APPOINTMENT   pantoprazole (PROTONIX) 40 MG tablet, Take 1 tablet (40 mg total) by mouth daily.   Sodium Sulfate-Mag Sulfate-KCl (SUTAB) [phone] MG TABS, Take 1 kit by mouth as directed. MANUFACTURER CODES BIN: [id number] PCN: CN GROUP: [id number] MEMBER ID: [id number];[employer] AS SECONDARY INSURANCE ;NO PRIOR AUTHORIZATION (Patient not taking: Reported on 01/30/2022)  Surgical History:  She  has a past surgical history that includes Dilation and curettage of uterus; Tonsillectomy; ERCP (06/08/2011); and Cholecystectomy (06/09/2011).  Current Medications, Allergies, Past Medical History, Past Surgical History, Family History and Social History were reviewed in [employer] record.  Physical Exam: BP 130/88   Pulse 100   Ht _0  (1.575 m)   Wt 122 lb 6.4 oz (55.5 kg)   SpO2 98%   BMI 22.39 kg/m  General:   Very anxious, tearful, no acute distress Heart : Regular rate and rhythm; no murmurs Pulm: Clear anteriorly; no wheezing Abdomen:  Soft, Non-distended AB, Sluggish bowel sounds. mild tenderness in the epigastrium. Without guarding and Without rebound, No organomegaly appreciated. Rectal: Not evaluated Extremities:  without  edema. Neurologic:  Alert and  oriented x4;  No focal deficits.  Psych:  Cooperative. Normal mood and  affect.   Roberta Crofts, PA-C 01/30/22

## 2022-01-30 ENCOUNTER — Encounter: Payer: Self-pay | Admitting: Physician Assistant

## 2022-01-30 ENCOUNTER — Other Ambulatory Visit (INDEPENDENT_AMBULATORY_CARE_PROVIDER_SITE_OTHER): Payer: Medicaid Other

## 2022-01-30 ENCOUNTER — Ambulatory Visit: Payer: Medicaid Other | Admitting: Physician Assistant

## 2022-01-30 VITALS — BP 130/88 | HR 100 | Ht 62.0 in | Wt 122.4 lb

## 2022-01-30 DIAGNOSIS — K5909 Other constipation: Secondary | ICD-10-CM | POA: Diagnosis not present

## 2022-01-30 DIAGNOSIS — K2990 Gastroduodenitis, unspecified, without bleeding: Secondary | ICD-10-CM

## 2022-01-30 DIAGNOSIS — R112 Nausea with vomiting, unspecified: Secondary | ICD-10-CM

## 2022-01-30 DIAGNOSIS — F411 Generalized anxiety disorder: Secondary | ICD-10-CM

## 2022-01-30 DIAGNOSIS — K2970 Gastritis, unspecified, without bleeding: Secondary | ICD-10-CM | POA: Diagnosis not present

## 2022-01-30 DIAGNOSIS — R109 Unspecified abdominal pain: Secondary | ICD-10-CM | POA: Diagnosis not present

## 2022-01-30 DIAGNOSIS — Z1212 Encounter for screening for malignant neoplasm of rectum: Secondary | ICD-10-CM

## 2022-01-30 DIAGNOSIS — R079 Chest pain, unspecified: Secondary | ICD-10-CM

## 2022-01-30 DIAGNOSIS — K3184 Gastroparesis: Secondary | ICD-10-CM

## 2022-01-30 DIAGNOSIS — Z1211 Encounter for screening for malignant neoplasm of colon: Secondary | ICD-10-CM

## 2022-01-30 LAB — CBC WITH DIFFERENTIAL/PLATELET
Basophils Absolute: 0.1 10*3/uL (ref 0.0–0.1)
Basophils Relative: 1 % (ref 0.0–3.0)
Eosinophils Absolute: 0.4 10*3/uL (ref 0.0–0.7)
Eosinophils Relative: 4.6 % (ref 0.0–5.0)
HCT: 45.2 % (ref 36.0–46.0)
Hemoglobin: 15.3 g/dL — ABNORMAL HIGH (ref 12.0–15.0)
Lymphocytes Relative: 29.7 % (ref 12.0–46.0)
Lymphs Abs: 2.7 10*3/uL (ref 0.7–4.0)
MCHC: 33.9 g/dL (ref 30.0–36.0)
MCV: 95.5 fl (ref 78.0–100.0)
Monocytes Absolute: 0.8 10*3/uL (ref 0.1–1.0)
Monocytes Relative: 8.5 % (ref 3.0–12.0)
Neutro Abs: 5 10*3/uL (ref 1.4–7.7)
Neutrophils Relative %: 56.2 % (ref 43.0–77.0)
Platelets: 253 10*3/uL (ref 150.0–400.0)
RBC: 4.73 Mil/uL (ref 3.87–5.11)
RDW: 13.1 % (ref 11.5–15.5)
WBC: 8.9 10*3/uL (ref 4.0–10.5)

## 2022-01-30 LAB — COMPREHENSIVE METABOLIC PANEL
ALT: 10 U/L (ref 0–35)
AST: 13 U/L (ref 0–37)
Albumin: 4.3 g/dL (ref 3.5–5.2)
Alkaline Phosphatase: 72 U/L (ref 39–117)
BUN: 8 mg/dL (ref 6–23)
CO2: 30 mEq/L (ref 19–32)
Calcium: 10.3 mg/dL (ref 8.4–10.5)
Chloride: 105 mEq/L (ref 96–112)
Creatinine, Ser: 0.65 mg/dL (ref 0.40–1.20)
GFR: 99.71 mL/min (ref >60.00)
Glucose, Bld: 89 mg/dL (ref 70–99)
Potassium: 4.6 mEq/L (ref 3.5–5.1)
Sodium: 139 mEq/L (ref 135–145)
Total Bilirubin: 0.4 mg/dL (ref 0.2–1.2)
Total Protein: 7.3 g/dL (ref 6.0–8.3)

## 2022-01-30 LAB — CORTISOL: Cortisol, Plasma: 4 ug/dL

## 2022-01-30 LAB — HIGH SENSITIVITY CRP: CRP, High Sensitivity: 3.04 mg/L (ref 0.000–5.000)

## 2022-01-30 LAB — LIPASE: Lipase: 29 U/L (ref 11.0–59.0)

## 2022-01-30 LAB — SEDIMENTATION RATE: Sed Rate: 16 mm/hr (ref 0–30)

## 2022-01-30 MED ORDER — PANTOPRAZOLE SODIUM 40 MG PO TBEC: 40.0000 mg | DELAYED_RELEASE_TABLET | Freq: Every day | ORAL | 1 refills | Status: DC

## 2022-01-30 MED ORDER — ONDANSETRON 8 MG PO TBDP: 8.0000 mg | ORAL_TABLET | Freq: Three times a day (TID) | ORAL | 0 refills | Status: DC | PRN

## 2022-01-30 MED ORDER — LINACLOTIDE 72 MCG PO CAPS: 72.0000 ug | ORAL_CAPSULE | Freq: Every day | ORAL | 1 refills | Status: DC

## 2022-01-30 NOTE — Patient Instructions (Addendum)
Your provider has requested that you go to the basement level for lab work before leaving today. Press "B" on the elevator. The lab is located at the first door on the left as you exit the elevator. ___________________________________________________________  [name] have been scheduled for a CT scan of the abdomen and pelvis at [facility], 1st floor Radiology. You are scheduled on Monday 02/06/22 at 6:30 pm. You should arrive 30 minutes prior to your appointment time for registration.     Please follow the written instructions below on the day of your exam:   1) Do not eat anything after 2:30 pm (4 hours prior to your test)   You may take any medications as prescribed with a small amount of water, if necessary. If you take any of the following medications: METFORMIN, GLUCOPHAGE, [name], AVANDAMET, RIOMET, FORTAMET, [place] MET, JANUMET, GLUMETZA or METAGLIP, you MAY be asked to HOLD this medication 48 hours AFTER the exam.   If you have any questions regarding your exam or if you need to reschedule, you may call [facility] at [phone] between the hours of 8:00 am and 5:00 pm, Monday-Friday.   _________________________________________________________________     Please take your proton pump inhibitor medication, protonix once a day FOR hiatal hernia  Please take this medication 30 minutes to 1 hour before meals- this makes it more effective.  Avoid spicy and acidic foods Avoid fatty foods Limit your intake of coffee, tea, alcohol, and carbonated drinks Work to maintain a healthy weight Keep the head of the bed elevated at least 3 inches with blocks or a wedge pillow if you are having any nighttime symptoms Stay upright for 2 hours after eating Avoid meals and snacks three to four hours before bedtime   If your stress echo is normal, can schedule for EGD/colon.   ___________________________  Cannabinoid Hyperemesis Syndrome  What is cannabinoid hyperemesis  syndrome?  Cannabinoid hyperemesis syndrome (CHS) is a condition that leads to repeated and severe bouts of vomiting. It is rare and only occurs in daily long-term users of marijuana.  Marijuana has several active substances. These include THC and related chemicals. These substances bind to molecules found in the brain. That causes the drug "high" and other effects that users feel.  Your digestive tract also has a number of molecules that bind to [facility] and related substances. So marijuana also affects the digestive tract. For example, the drug can change the time it takes the stomach to empty. It also affects the esophageal sphincter. That's the tight band of muscle that opens and closes to let food from the esophagus into the stomach. Long-term marijuana use can change the way the affected molecules respond and lead to the symptoms of CHS.  Marijuana is the most widely used recreational drug in the [place] adults are the most frequent users. A small number of these people develop CHS. It often only happens in people who have regularly used marijuana. Often CHS affects those who use the drug at least once a day.  What causes cannabinoid hyperemesis syndrome?  Marijuana has very complex effects on the body. Experts are still trying to learn exactly how it causes CHS in some people.  In the brain, marijuana often has the opposite effect of CHS. It helps prevent nausea and vomiting. The drug is also good at stopping such symptoms in people having chemotherapy.  But in the digestive tract, marijuana seems to have the opposite effect. It actually makes you more likely to  have nausea and vomiting. With the first use of marijuana, the signals from the brain may be more important. That may lead to anti-nausea effects at first. But with repeated use of marijuana, certain receptors in the brain may stop responding to the drug in the same way. That may cause the repeated bouts of vomiting found in people with  CHS.  It still isn't clear why some heavy marijuana users get the syndrome, but others don't.  What are the symptoms of cannabinoid hyperemesis syndrome?  People with CHS suffer from repeated bouts of vomiting. In between these episodes are times without any symptoms. Healthcare providers often divide these symptoms into 3 stages: the prodromal phase, the hyperemetic phase, and the recovery phase.  Prodromal phase. During this phase, the main symptoms are often early morning nausea and belly (abdominal) pain. Some people also develop a fear of vomiting. Most people keep normal eating patterns during this time. Some people use more marijuana because they think it will help stop the nausea. This phase may last for months or years.  Hyperemetic phase. Symptoms during this time may include:  Ongoing nausea  Repeated episodes of vomiting  Belly pain  Decreased food intake and weight loss  Symptoms of fluid loss (dehydration)  During this phase, vomiting is often intense and overwhelming. Many people take a lot of hot showers during the day. They find that doing so eases their nausea. (That may be because of how the hot temperature affects a part of the brain called the hypothalamus. This part of the brain effects both temperature regulation and vomiting.) People often first seek medical care during this phase.  The hyperemetic phase may continue until the person completely stops using marijuana. Then the recovery phase starts.  Recovery phase. During this time, symptoms go away. Normal eating is possible again. This phase can last days or months. Symptoms often come back if the person tries marijuana again.  How is cannabinoid hyperemesis syndrome diagnosed?  Many health problems can cause repeated vomiting. To make a diagnosis, your healthcare provider will ask you about your symptoms and your past health. He or she will also do a physical exam, including an exam of your belly. Often this  diagnosis can be made from history alone, however.  Your healthcare provider may also need more tests to rule out other causes of the vomiting.  CHS was only recently discovered. So some healthcare providers may not know about it. As a result, they may not spot it for many years. They often confuse CHS with cyclical vomiting disorder. That is a health problem that causes similar symptoms. A specialist trained in diseases of the digestive tract (gastroenterologist) might make the diagnosis.  You may have CHS if you have all of these:  Long-term weekly and daily marijuana use  Belly pain  Severe, repeated nausea and vomiting  You feel better after taking a hot shower  There is no single test that confirms this diagnosis. Only improvement after quitting marijuana confirms the diagnosis.  How is cannabinoid hyperemesis syndrome treated?  To fully get better, you need to stop using marijuana all together. If you stop using marijuana, your symptoms should not come back.  Symptoms often ease after a day or 2 unless marijuana is used before this time.  In a small sample of people with CHS, rubbing capsaicin cream on the belly helped decrease pain and nausea. The chemicals in the cream have the same effect as a hot shower.  Quitting marijuana  may lead to other health benefits, such as:  Better lung function  Improved memory and thinking skills  Better sleep  Key points about cannabinoid hyperemesis syndrome  CHS is a condition that leads to repeated and severe bouts of vomiting. It results from long-term use of marijuana.  Most people self-treat using hot showers to help reduce their symptoms.  Some people with CHS may not be diagnosed for several years. Admitting to your healthcare provider that you use marijuana daily can speed up the diagnosis.  Symptoms start to go away within a day or 2 after stopping marijuana use.  Symptoms can come back if you use marijuana  again   Gastroparesis Please do small frequent meals like 4-6 meals a day.  Eat and drink liquids at separate times.  Avoid high fiber foods, cook your vegetables, avoid high fat food.  Suggest spreading protein throughout the day (greek yogurt, glucerna, soft meat, milk, eggs) Choose soft foods that you can mash with a fork When you are more symptomatic, change to pureed foods foods and liquids.  Consider reading "Living well with Gastroparesis" by Lambert Keto Gastroparesis is a condition in which food takes longer than normal to empty from the stomach. This condition is also known as delayed gastric emptying. It is usually a long-term (chronic) condition. There is no cure, but there are treatments and things that you can do at home to help relieve symptoms. Treating the underlying condition that causes gastroparesis can also help relieve symptoms What are the causes? In many cases, the cause of this condition is not known. Possible causes include: A hormone (endocrine) disorder, such as hypothyroidism or diabetes. A nervous system disease, such as Parkinson's disease or multiple sclerosis. Cancer, infection, or surgery that affects the stomach or vagus nerve. The vagus nerve runs from your chest, through your neck, and to the lower part of your brain. A connective tissue disorder, such as scleroderma. Certain medicines. What increases the risk? You are more likely to develop this condition if: You have certain disorders or diseases. These may include: An endocrine disorder. An eating disorder. Amyloidosis. Scleroderma. Parkinson's disease. Multiple sclerosis. Cancer or infection of the stomach or the vagus nerve. You have had surgery on your stomach or vagus nerve. You take certain medicines. You are female. What are the signs or symptoms? Symptoms of this condition include: Feeling full after eating very little or a loss of appetite. Nausea, vomiting, or  heartburn. Bloating of your abdomen. Inconsistent blood sugar (glucose) levels on blood tests. Unexplained weight loss. Acid from the stomach coming up into the esophagus (gastroesophageal reflux). Sudden tightening (spasm) of the stomach, which can be painful. Symptoms may come and go. Some people may not notice any symptoms. How is this diagnosed? This condition is diagnosed with tests, such as: Tests that check how long it takes food to move through the stomach and intestines. These tests include: Upper gastrointestinal (GI) series. For this test, you drink a liquid that shows up well on X-rays, and then X-rays are taken of your intestines. Gastric emptying scintigraphy. For this test, you eat food that contains a small amount of radioactive material, and then scans are taken. Wireless capsule GI monitoring system. For this test, you swallow a pill (capsule) that records information about how foods and fluid move through your stomach. Gastric manometry. For this test, a tube is passed down your throat and into your stomach to measure electrical and muscular activity. Endoscopy. For this test, a long, thin  tube with a camera and light on the end is passed down your throat and into your stomach to check for problems in your stomach lining. Ultrasound. This test uses sound waves to create images of the inside of your body. This can help rule out gallbladder disease or pancreatitis as a cause of your symptoms. How is this treated? There is no cure for this condition, but treatment and home care may relieve symptoms. Treatment may include: Treating the underlying cause. Managing your symptoms by making changes to your diet and exercise habits. Taking medicines to control nausea and vomiting and to stimulate stomach muscles. Getting food through a feeding tube in the hospital. This may be done in severe cases. Having surgery to insert a device called a gastric electrical stimulator into your body.  This device helps improve stomach emptying and control nausea and vomiting. Follow these instructions at home: Take over-the-counter and prescription medicines only as told by your health care provider. Follow instructions from your health care provider about eating or drinking restrictions. Your health care provider may recommend that you: Eat smaller meals more often. Eat low-fat foods. Eat low-fiber forms of high-fiber foods. For example, eat cooked vegetables instead of raw vegetables. Have only liquid foods instead of solid foods. Liquid foods are easier to digest. Drink enough fluid to keep your urine pale yellow. Exercise as often as told by your health care provider. Keep all follow-up visits. This is important. Contact a health care provider if you: Notice that your symptoms do not improve with treatment. Have new symptoms. Get help right away if you: Have severe pain in your abdomen that does not improve with treatment. Have nausea that is severe or does not go away. Vomit every time you drink fluids. Summary Gastroparesis is a long-term (chronic) condition in which food takes longer than normal to empty from the stomach. Symptoms include nausea, vomiting, heartburn, bloating of your abdomen, and loss of appetite. Eating smaller portions, low-fat foods, and low-fiber forms of high-fiber foods may help you manage your symptoms. Get help right away if you have severe pain in your abdomen. This information is not intended to replace advice given to you by your health care provider. Make sure you discuss any questions you have with your health care provider. Document Revised: 06/23/2019 Document Reviewed: 06/23/2019 Elsevier Patient Education  2021 [employer].

## 2022-01-31 ENCOUNTER — Telehealth: Payer: Self-pay | Admitting: Physician Assistant

## 2022-01-31 DIAGNOSIS — R112 Nausea with vomiting, unspecified: Secondary | ICD-10-CM

## 2022-01-31 MED ORDER — ONDANSETRON 8 MG PO TBDP: 8.0000 mg | ORAL_TABLET | Freq: Three times a day (TID) | ORAL | 2 refills | Status: DC | PRN

## 2022-01-31 NOTE — Telephone Encounter (Signed)
Patient states she was given only 30 tablets of Zofran but was told to take it three times a day if needed. Informed patient I will send a 90 tablets and 2 refills. Patient verbalized understanding.

## 2022-01-31 NOTE — Telephone Encounter (Signed)
Inbound call from patient requesting a call to discuss the medications she was prescribed yesterday.

## 2022-02-01 NOTE — Progress Notes (Signed)
Addendum: Reviewed and agree with assessment and management plan. Roneisha Stern M, MD  

## 2022-02-06 ENCOUNTER — Ambulatory Visit (HOSPITAL_COMMUNITY)
Admission: RE | Admit: 2022-02-06 | Discharge: 2022-02-06 | Disposition: A | Discharge status: Home / Self Care | Payer: Medicaid Other | Source: Ambulatory Visit | Attending: Physician Assistant | Admitting: Physician Assistant

## 2022-02-06 DIAGNOSIS — R112 Nausea with vomiting, unspecified: Secondary | ICD-10-CM | POA: Diagnosis not present

## 2022-02-06 DIAGNOSIS — R109 Unspecified abdominal pain: Secondary | ICD-10-CM | POA: Insufficient documentation

## 2022-02-06 MED ORDER — IOHEXOL 300 MG/ML  SOLN
100.0000 mL | Freq: Once | INTRAMUSCULAR | Status: AC | PRN
  Administered 2022-02-06: 100 mL via INTRAVENOUS

## 2022-02-23 ENCOUNTER — Telehealth: Payer: Self-pay | Admitting: Physician Assistant

## 2022-02-23 ENCOUNTER — Other Ambulatory Visit: Payer: Self-pay

## 2022-02-23 MED ORDER — BISACODYL 5 MG PO TBEC: DELAYED_RELEASE_TABLET | ORAL | 0 refills | Status: DC

## 2022-02-23 MED ORDER — POLYETHYLENE GLYCOL 3350 17 G PO PACK: PACK | ORAL | 0 refills | Status: DC

## 2022-02-23 NOTE — Telephone Encounter (Signed)
Patient called, stated she was given Linzess at her last visit. Patient states she started taking two capsules a day and she has seen no improvement. Patient is requesting a change in her medication. Please advise. Thank you.

## 2022-02-23 NOTE — Telephone Encounter (Signed)
Pt aware of recommendations. States she is on medicaid and really cannot afford OTC meds. Requested script be sent to pharmacy. Let her know they will be sent in but do not know if they will be covered.

## 2022-02-23 NOTE — Telephone Encounter (Signed)
She needs to do a MiraLAx purge - 4 dulcolax po, wait 1 hour then drink 6 doses of MiraLax over 1-2 hours  Then resume Linzess tomorrow  Also - ondansetron can lead to constipation so reduce use of that as possible

## 2022-02-23 NOTE — Telephone Encounter (Signed)
[name] was given linzess and started doubling this dose and states it has not been working for her. Requesting something different for constipation. Please advise as DOD.

## 2022-03-06 ENCOUNTER — Telehealth: Payer: Self-pay | Admitting: Physician Assistant

## 2022-03-06 NOTE — Telephone Encounter (Signed)
Patient called in with complaints of dull, upper abdominal pain under both sides of her ribs that radiates to her back & nausea w/o vomiting that started early this morning. States she woke up & her pain was a 10/10, however has improved some. She's taking pantoprazole QD & zofran PRN. Bowel movements normal. Tolerating diet okay. She was advised to follow up with our office after her stress echo on 03/16/22. OV scheduled for 03/28/22 at 9:00 am with [place], [place]. Pt would like further recommendations for her symptoms until she can be seen for her appointment.

## 2022-03-06 NOTE — Telephone Encounter (Signed)
She was very recently seen by Vicie Mutters, PA-C At this time I do not have any additional recommendations, would proceed with echocardiogram and follow-up as scheduled

## 2022-03-06 NOTE — Telephone Encounter (Signed)
Spoke with patient regarding MD recommendations. Pt verbalized all understanding. 

## 2022-03-06 NOTE — Telephone Encounter (Signed)
Patient called stated she woke up this morning with increased abdominal pain. Patient denied request to schedule appointment with a PA for the end of January. Requesting a nurse to call her. Please advise.

## 2022-03-28 ENCOUNTER — Ambulatory Visit: Payer: Medicaid Other | Admitting: Physician Assistant

## 2022-05-31 NOTE — Progress Notes (Unsigned)
05/31/2022 [name] [id number] Sep 09, 1965  Referring provider: Kennith Maes, PA-C Primary GI doctor: Dr. Rhea Belton  ASSESSMENT AND PLAN:   Nausea vomiting and patient with history of EGD 2022 with 3 cm hiatal hernia, stomach contents likely gastroparesis.  Patient also smokes marijuana daily. Refilled Zofran to take up to 3 times a day. Patient not taking Protonix refilled instructed to take once daily for possible hiatal hernia Instructed to quit smoking marijuana, can take up to 9-12 months of cessation.  Can consider amitriptyline Handout given to the patient Check cortisol, lipase  Chronic constipation Possible component of global paresis Continue Linzess 72 mcg take daily. Can consider Motegrity.  Screening for colorectal cancer Patient has never had, is tearful in the room, very anxious in general. This could also be contributing to her symptoms, discussed discussing with primary care. Currently getting evaluated with stress echo mid-January for chest pain, if this is negative would suggest getting colonoscopy with repeat endoscopy to evaluate further.  Anxiety state Likely contributing to her IBS with constipation/nausea and vomiting, suggest talking with primary care about SSRI/SNRI for possible symptoms, and to help patient stop marijuana.  Abdominal pain, unspecified abdominal location, worse epigastric. Some worsening abdominal pain, yet again this could be from Cannabinoid hyperemesis syndrome last CT was 2020, while waiting on stress echo results, will repeat CT abdomen pelvis with contrast for abdominal pain, nausea, vomiting.  Can evaluate previous hiatal hernia, look for duodenitis, obstruction, pancreatitis.  ETC.  Follow-up 6 to 8 weeks, should have stress echo results at that time. Consider endoscopic evaluation with screening colonoscopy which patient has not had and repeat endoscopy for symptoms.  History of Present Illness:  57 y.o. female  with a  past medical history of GERD, duodenal ulcers, positive H. pylori IgG antibody in 2018, hiatal hernia and gastroparesis.  Past cholecystectomy.   and others listed below, returns to clinic today for evaluation of nausea.  04/05/2020 EGD which identified a 3 cm hiatal hernia, a nonobstructing Schatzki's ring and a large amount of food residue was in the stomach consistent with gastroparesis  09/29/2020 office visit with [name] for nausea and vomiting Patient has history of chronic constipation takes Linzess 5 days a week.  02/2021 s/p ablation for pSVT 01/10/2022 ER visit for chest pain saw cardiology at [facility],  getting stress echo to rule out ischemia. Scheduled Jan 18th.  01/30/2022 office visit with myself for multitude of symptoms, patient was getting stress echocardiogram so got CT and here for follow-up for possible EGD and colonoscopy. At that visit also discussed marijuana cessation and discussing with PCP about anxiety.  Started on Linzess 72, considered Motegrity. 02/06/2022 CT abdomen pelvis with contrast showed 5 mm right lower pulmonary nodule aortic atherosclerosis otherwise unremarkable.  Normal liver, normal pancreas normal spleen.  She states she canceled a lot of things due to losing insurance, needs to reschedule OSA.  She has nausea daily, takes zofran at least 2 x a day. Had vomiting this morning due to running out of her zofran.  She takes linzess q 5 day, has taken last several days.  She has hot flashes with itching all over.  She joined support group for gastroparesis, she wants to know why she has never had GES. Had EGD with large amount of food.  She is gaining weight.  She is having epigastric discomfort and feels food get stuck mid esophagus.  She states as long as she takes the linzess she can have movement.  Smokes marijuana daily for the past year. Patient was offered screening colonoscopy but declined. Wt Readings from Last 8 Encounters:  01/30/22 122 lb  6.4 oz (55.5 kg)  09/29/20 110 lb (49.9 kg)  05/27/20 116 lb 3.2 oz (52.7 kg)  04/05/20 114 lb (51.7 kg)  02/09/20 114 lb 3.2 oz (51.8 kg)  08/26/19 118 lb 4 oz (53.6 kg)  07/30/19 120 lb (54.4 kg)  06/09/19 121 lb (54.9 kg)    She  reports that she has never smoked. She has never used smokeless tobacco. She reports current alcohol use. She reports current drug use. Drug: Marijuana. Her family history includes Bone cancer in her maternal uncle; Diabetes in her mother; Heart disease in her father and mother; Heart disease (age of onset: 22) in her maternal grandfather and maternal grandmother; Heart failure in her father; Hypertension in her sister; Lung cancer in her maternal uncle; Polycystic ovary syndrome in her daughter.   Current Medications:        Current Outpatient Medications (Other):    bisacodyl (DULCOLAX) 5 MG EC tablet, Take 4 tablets this evening   polyethylene glycol (MIRALAX MIX-IN PAX) 17 g packet, Take 6 doses of miralax over 1-2 hours after taking dulcolax and waiting an hour   linaclotide (LINZESS) 72 MCG capsule, Take 1 capsule (72 mcg total) by mouth daily before breakfast.   ondansetron (ZOFRAN-ODT) 8 MG disintegrating tablet, Take 1 tablet (8 mg total) by mouth every 8 (eight) hours as needed for nausea or vomiting.   pantoprazole (PROTONIX) 40 MG tablet, Take 1 tablet (40 mg total) by mouth daily.   Sodium Sulfate-Mag Sulfate-KCl (SUTAB) [phone] MG TABS, Take 1 kit by mouth as directed. MANUFACTURER CODES BIN: [id number] PCN: CN GROUP: [id number] MEMBER ID: [id number];[employer] AS SECONDARY INSURANCE ;NO PRIOR AUTHORIZATION (Patient not taking: Reported on 01/30/2022)  Surgical History:  She  has a past surgical history that includes Dilation and curettage of uterus; Tonsillectomy; ERCP (06/08/2011); and Cholecystectomy (06/09/2011).  Current Medications, Allergies, Past Medical History, Past Surgical History, Family History and Social History were reviewed in  [employer] [facility] record.  Physical Exam: There were no vitals taken for this visit. General:   Very anxious, tearful, no acute distress Heart : Regular rate and rhythm; no murmurs Pulm: Clear anteriorly; no wheezing Abdomen:  Soft, Non-distended AB, Sluggish bowel sounds. mild tenderness in the epigastrium. Without guarding and Without rebound, No organomegaly appreciated. Rectal: Not evaluated Extremities:  without  edema. Neurologic:  Alert and  oriented x4;  No focal deficits.  Psych:  Cooperative. Normal mood and affect.   Doree Albee, PA-C 05/31/22

## 2022-06-07 ENCOUNTER — Ambulatory Visit (INDEPENDENT_AMBULATORY_CARE_PROVIDER_SITE_OTHER): Payer: Medicaid Other | Admitting: Physician Assistant

## 2022-06-07 ENCOUNTER — Encounter: Payer: Self-pay | Admitting: Physician Assistant

## 2022-06-07 VITALS — BP 110/70 | HR 77 | Ht 62.0 in | Wt 137.2 lb

## 2022-06-07 DIAGNOSIS — K5909 Other constipation: Secondary | ICD-10-CM | POA: Diagnosis not present

## 2022-06-07 DIAGNOSIS — Z1211 Encounter for screening for malignant neoplasm of colon: Secondary | ICD-10-CM

## 2022-06-07 DIAGNOSIS — R109 Unspecified abdominal pain: Secondary | ICD-10-CM

## 2022-06-07 DIAGNOSIS — K3184 Gastroparesis: Secondary | ICD-10-CM | POA: Diagnosis not present

## 2022-06-07 DIAGNOSIS — R112 Nausea with vomiting, unspecified: Secondary | ICD-10-CM | POA: Diagnosis not present

## 2022-06-07 DIAGNOSIS — Z1212 Encounter for screening for malignant neoplasm of rectum: Secondary | ICD-10-CM

## 2022-06-07 NOTE — Patient Instructions (Signed)
Gastroparesis Please do small frequent meals like 4-6 meals a day.  Eat and drink liquids at separate times.  Avoid high fiber foods, cook your vegetables, avoid high fat food.  Suggest spreading protein throughout the day (greek yogurt, glucerna, soft meat, milk, eggs) Choose soft foods that you can mash with a fork When you are more symptomatic, change to pureed foods foods and liquids.  Consider reading "Living well with Gastroparesis" by Crystal Zaborrowski Gastroparesis is a condition in which food takes longer than normal to empty from the stomach. This condition is also known as delayed gastric emptying. It is usually a long-term (chronic) condition. There is no cure, but there are treatments and things that you can do at home to help relieve symptoms. Treating the underlying condition that causes gastroparesis can also help relieve symptoms What are the causes? In many cases, the cause of this condition is not known. Possible causes include: A hormone (endocrine) disorder, such as hypothyroidism or diabetes. A nervous system disease, such as Parkinson's disease or multiple sclerosis. Cancer, infection, or surgery that affects the stomach or vagus nerve. The vagus nerve runs from your chest, through your neck, and to the lower part of your brain. A connective tissue disorder, such as scleroderma. Certain medicines. What increases the risk? You are more likely to develop this condition if: You have certain disorders or diseases. These may include: An endocrine disorder. An eating disorder. Amyloidosis. Scleroderma. Parkinson's disease. Multiple sclerosis. Cancer or infection of the stomach or the vagus nerve. You have had surgery on your stomach or vagus nerve. You take certain medicines. You are female. What are the signs or symptoms? Symptoms of this condition include: Feeling full after eating very little or a loss of appetite. Nausea, vomiting, or heartburn. Bloating of  your abdomen. Inconsistent blood sugar (glucose) levels on blood tests. Unexplained weight loss. Acid from the stomach coming up into the esophagus (gastroesophageal reflux). Sudden tightening (spasm) of the stomach, which can be painful. Symptoms may come and go. Some people may not notice any symptoms. How is this diagnosed? This condition is diagnosed with tests, such as: Tests that check how long it takes food to move through the stomach and intestines. These tests include: Upper gastrointestinal (GI) series. For this test, you drink a liquid that shows up well on X-rays, and then X-rays are taken of your intestines. Gastric emptying scintigraphy. For this test, you eat food that contains a small amount of radioactive material, and then scans are taken. Wireless capsule GI monitoring system. For this test, you swallow a pill (capsule) that records information about how foods and fluid move through your stomach. Gastric manometry. For this test, a tube is passed down your throat and into your stomach to measure electrical and muscular activity. Endoscopy. For this test, a long, thin tube with a camera and light on the end is passed down your throat and into your stomach to check for problems in your stomach lining. Ultrasound. This test uses sound waves to create images of the inside of your body. This can help rule out gallbladder disease or pancreatitis as a cause of your symptoms. How is this treated? There is no cure for this condition, but treatment and home care may relieve symptoms. Treatment may include: Treating the underlying cause. Managing your symptoms by making changes to your diet and exercise habits. Taking medicines to control nausea and vomiting and to stimulate stomach muscles. Getting food through a feeding tube in the hospital. This   may be done in severe cases. Having surgery to insert a device called a gastric electrical stimulator into your body. This device helps  improve stomach emptying and control nausea and vomiting. Follow these instructions at home: Take over-the-counter and prescription medicines only as told by your health care provider. Follow instructions from your health care provider about eating or drinking restrictions. Your health care provider may recommend that you: Eat smaller meals more often. Eat low-fat foods. Eat low-fiber forms of high-fiber foods. For example, eat cooked vegetables instead of raw vegetables. Have only liquid foods instead of solid foods. Liquid foods are easier to digest. Drink enough fluid to keep your urine pale yellow. Exercise as often as told by your health care provider. Keep all follow-up visits. This is important. Contact a health care provider if you: Notice that your symptoms do not improve with treatment. Have new symptoms. Get help right away if you: Have severe pain in your abdomen that does not improve with treatment. Have nausea that is severe or does not go away. Vomit every time you drink fluids. Summary Gastroparesis is a long-term (chronic) condition in which food takes longer than normal to empty from the stomach. Symptoms include nausea, vomiting, heartburn, bloating of your abdomen, and loss of appetite. Eating smaller portions, low-fat foods, and low-fiber forms of high-fiber foods may help you manage your symptoms. Get help right away if you have severe pain in your abdomen. This information is not intended to replace advice given to you by your health care provider. Make sure you discuss any questions you have with your health care provider. Document Revised: 06/23/2019 Document Reviewed: 06/23/2019 Elsevier Patient Education  2021 Elsevier Inc.  

## 2022-06-12 NOTE — Progress Notes (Signed)
Addendum: Reviewed and agree with assessment and management plan. Evarose Altland M, MD  

## 2022-06-23 ENCOUNTER — Other Ambulatory Visit: Payer: Self-pay

## 2022-06-23 ENCOUNTER — Telehealth: Payer: Self-pay | Admitting: Physician Assistant

## 2022-06-23 DIAGNOSIS — K2970 Gastritis, unspecified, without bleeding: Secondary | ICD-10-CM

## 2022-06-23 DIAGNOSIS — R112 Nausea with vomiting, unspecified: Secondary | ICD-10-CM

## 2022-06-23 DIAGNOSIS — K5909 Other constipation: Secondary | ICD-10-CM

## 2022-06-23 MED ORDER — PANTOPRAZOLE SODIUM 40 MG PO TBEC: 40.0000 mg | DELAYED_RELEASE_TABLET | Freq: Every day | ORAL | 1 refills | Status: DC

## 2022-06-23 MED ORDER — LINACLOTIDE 145 MCG PO CAPS: 145.0000 ug | ORAL_CAPSULE | Freq: Every day | ORAL | 0 refills | Status: DC

## 2022-06-23 NOTE — Telephone Encounter (Signed)
At the last office visit you said she could increase her dose of Linzess from to 145mg . Patient is ready to try the higher dose. Ok to send a Rx for her ?

## 2022-06-23 NOTE — Telephone Encounter (Signed)
Refills provided for her pantoprazole. She still has refills on the Zofran so those have been denied for refills. Patient is aware of refills and new Rx for the Linzess .

## 2022-06-23 NOTE — Telephone Encounter (Signed)
Patient is calling wishing to up the dose of her Linzess, also wanting a refill on her Pantoprazole and her Zofran. Please advise

## 2022-08-07 ENCOUNTER — Telehealth: Payer: Self-pay | Admitting: Physician Assistant

## 2022-08-07 NOTE — Telephone Encounter (Signed)
Pt states the linzess is not working well, last regular BM was 3 days ago.  States she did pass a small amt of stool but not her normal. Pt is currently taking Linzess daily. Please advise.

## 2022-08-07 NOTE — Telephone Encounter (Signed)
Sent message for patient to increase linzess to 2 pills of the 145 to make 290 and let us know how she does. If she does well, can send in script, if she does not have enough can leave samples of the 290 for her to pick.  If she does not do well with this can consider motegrity or isbrella samples.

## 2022-08-07 NOTE — Telephone Encounter (Signed)
Noted  

## 2022-08-07 NOTE — Telephone Encounter (Signed)
PT requesting to speak to nurse. Linzess is not working well for her and has excessive gas. Please advise.

## 2022-08-28 ENCOUNTER — Ambulatory Visit (AMBULATORY_SURGERY_CENTER): Payer: Medicaid Other

## 2022-08-28 VITALS — Ht 62.0 in | Wt 145.0 lb

## 2022-08-28 DIAGNOSIS — R109 Unspecified abdominal pain: Secondary | ICD-10-CM

## 2022-08-28 DIAGNOSIS — Z1211 Encounter for screening for malignant neoplasm of colon: Secondary | ICD-10-CM

## 2022-08-28 DIAGNOSIS — K5909 Other constipation: Secondary | ICD-10-CM

## 2022-08-28 DIAGNOSIS — Z1212 Encounter for screening for malignant neoplasm of rectum: Secondary | ICD-10-CM

## 2022-08-28 MED ORDER — SUTAB 1479-225-188 MG PO TABS: 12.0000 | ORAL_TABLET | ORAL | 0 refills | Status: DC

## 2022-08-28 NOTE — Progress Notes (Signed)
No egg or soy allergy known to patient  No issues known to pt with past sedation with any surgeries or procedures Patient denies ever being told they had issues or difficulty with intubation  No FH of Malignant Hyperthermia Pt is not on diet pills Pt is not on  home 02  Pt is not on blood thinners  Pt has issues with constipation and takes Linzess No A fib or A flutter Have any cardiac testing pending--no Pt instructed to use Singlecare.com or GoodRx for a price reduction on prep  Patient's chart reviewed by Cathlyn Parsons CNRA prior to previsit and patient appropriate for the [facility].  Previsit completed and red dot placed by patient's name on their procedure day (on provider's schedule).   Can ambulate independently

## 2022-08-29 ENCOUNTER — Encounter: Payer: Self-pay | Admitting: Internal Medicine

## 2022-08-30 ENCOUNTER — Telehealth: Payer: Self-pay

## 2022-08-30 NOTE — Telephone Encounter (Signed)
PT is calling about update on prep medication. [name] will not be covered by insurance. Please advise.

## 2022-08-30 NOTE — Telephone Encounter (Signed)
Okay, proceed with EGD only Would recommend Cologuard for the patient and this can be ordered and sent to her if she is agreeable

## 2022-08-30 NOTE — Telephone Encounter (Signed)
Per patient request Sutab sent to pharmacy.  Spoke with patient regarding Sutab prep. Patient indicated she could tried to pick up prescription, but cost was prohibitive, as she is unemployed.  Offered to call in Suprep or Goyletly and pay $4 co pay but patient indicated she was unable to drink the volume of fluid required for bowel prep.  Offered a free sample of Plenvu and went over instructions and explained in detail that it was less volume than the alternate preps, but patient verbalized inability to drink the 32oz required for this prep.  Patient is requesting to proceed with EGD but cancel colonoscopy.  Please advise.

## 2022-09-05 ENCOUNTER — Other Ambulatory Visit: Payer: Self-pay | Admitting: *Deleted

## 2022-09-05 DIAGNOSIS — Z1211 Encounter for screening for malignant neoplasm of colon: Secondary | ICD-10-CM

## 2022-09-05 NOTE — Telephone Encounter (Signed)
[name] Please proceed with order for Cologuard for screening

## 2022-09-05 NOTE — Telephone Encounter (Signed)
Patient would like to have the colorguard ordered per Dr. Rhea Belton. And will have EGD on 7/10.

## 2022-09-05 NOTE — Telephone Encounter (Signed)
Called patient to inform of Cologuard screening being mailed to home. Patient is also schedule for Endoscopic procedure on tomorrow. Patient understood and agrees.

## 2022-09-06 ENCOUNTER — Ambulatory Visit: Payer: Medicaid Other | Admitting: Internal Medicine

## 2022-09-06 ENCOUNTER — Encounter: Payer: Self-pay | Admitting: Internal Medicine

## 2022-09-06 VITALS — BP 110/68 | HR 85 | Temp 97.7°F | Resp 14 | Ht 62.0 in | Wt 145.0 lb

## 2022-09-06 DIAGNOSIS — K222 Esophageal obstruction: Secondary | ICD-10-CM

## 2022-09-06 DIAGNOSIS — K2970 Gastritis, unspecified, without bleeding: Secondary | ICD-10-CM | POA: Diagnosis not present

## 2022-09-06 DIAGNOSIS — K449 Diaphragmatic hernia without obstruction or gangrene: Secondary | ICD-10-CM | POA: Diagnosis not present

## 2022-09-06 DIAGNOSIS — K5909 Other constipation: Secondary | ICD-10-CM

## 2022-09-06 DIAGNOSIS — R110 Nausea: Secondary | ICD-10-CM

## 2022-09-06 DIAGNOSIS — R1319 Other dysphagia: Secondary | ICD-10-CM

## 2022-09-06 DIAGNOSIS — R1013 Epigastric pain: Secondary | ICD-10-CM

## 2022-09-06 MED ORDER — LINACLOTIDE 290 MCG PO CAPS
290.0000 ug | ORAL_CAPSULE | Freq: Every day | ORAL | 3 refills | Status: DC
Start: 2022-09-06 — End: 2023-05-08

## 2022-09-06 MED ORDER — SODIUM CHLORIDE 0.9 % IV SOLN
500.0000 mL | Freq: Once | INTRAVENOUS | Status: AC
Start: 2022-09-06 — End: ?

## 2022-09-06 NOTE — Op Note (Signed)
[facility] Patient Name: [name] Procedure Date: 09/06/2022 10:33 AM MRN: [id number] Endoscopist: Beverley Fiedler , MD, [id number] Age: 57 Referring MD:  Date of Birth: Jul 20, 1965 Gender: Female Account #: [id number] Procedure:                Upper GI endoscopy Indications:              Epigastric abdominal pain, Dysphagia, Nausea Medicines:                Monitored Anesthesia Care Procedure:                Pre-Anesthesia Assessment:                           - Prior to the procedure, a History and Physical                            was performed, and patient medications and                            allergies were reviewed. The patient's tolerance of                            previous anesthesia was also reviewed. The risks                            and benefits of the procedure and the sedation                            options and risks were discussed with the patient.                            All questions were answered, and informed consent                            was obtained. Prior Anticoagulants: The patient has                            taken no anticoagulant or antiplatelet agents. ASA                            Grade Assessment: III - A patient with severe                            systemic disease. After reviewing the risks and                            benefits, the patient was deemed in satisfactory                            condition to undergo the procedure.                           After obtaining informed consent, the endoscope was  passed under direct vision. Throughout the                            procedure, the patient's blood pressure, pulse, and                            oxygen saturations were monitored continuously. The                            GIF [id number] #[id number] was introduced through the                            mouth, and advanced to the second part of duodenum.                            The  upper GI endoscopy was accomplished without                            difficulty. The patient tolerated the procedure                            well. Scope In: Scope Out: Findings:                 A low-grade of narrowing Schatzki ring was found at                            the gastroesophageal junction. A TTS dilator was                            passed through the scope. Dilation with an 18-19-20                            mm balloon dilator was performed to 20 mm. The                            dilation site was examined and showed no change and                            thus cold forceps were used to disrupt the ring in                            4 quadrants.                           A 3 cm hiatal hernia was present.                           Patchy mild inflammation characterized by erythema                            was found in the cardia and in the gastric fundus.  Biopsies were taken with a cold forceps for                            histology and Helicobacter pylori testing.                           The examined duodenum was normal. Complications:            No immediate complications. Estimated Blood Loss:     Estimated blood loss was minimal. Impression:               - Low-grade of narrowing Schatzki ring. Dilated to                            20 mm with balloon and ring disruption with cold                            forceps.                           - 3 cm hiatal hernia.                           - Mild gastritis. Biopsied to exclude H. Pylori                            given nausea and epigastric pain.                           - Normal examined duodenum. Recommendation:           - Patient has a contact number available for                            emergencies. The signs and symptoms of potential                            delayed complications were discussed with the                            patient. Return to normal activities  tomorrow.                            Written discharge instructions were provided to the                            patient.                           - Resume previous diet.                           - Continue present medications.                           - Await pathology results. Beverley Fiedler, MD 09/06/2022 10:53:55 AM This report has been signed electronically.

## 2022-09-06 NOTE — Progress Notes (Signed)
Called to room to assist during endoscopic procedure.  Patient ID and intended procedure confirmed with present staff. Received instructions for my participation in the procedure from the performing physician.  

## 2022-09-06 NOTE — Progress Notes (Signed)
Pt's states no medical or surgical changes since previsit or office visit. 

## 2022-09-06 NOTE — Patient Instructions (Signed)
Resume previous diet and continue present medications - pick up prescription for Linzess 290 mg daily that was sent to [facility] off of [address] Await pathology results from biopsies taken today!  YOU HAD AN ENDOSCOPIC PROCEDURE TODAY AT THE [facility]:   Refer to the procedure report that was given to you for any specific questions about what was found during the examination.  If the procedure report does not answer your questions, please call your gastroenterologist to clarify.  If you requested that your care partner not be given the details of your procedure findings, then the procedure report has been included in a sealed envelope for you to review at your convenience later.  YOU SHOULD EXPECT: Some feelings of bloating in the abdomen. Passage of more gas than usual.  Walking can help get rid of the air that was put into your GI tract during the procedure and reduce the bloating. If you had a lower endoscopy (such as a colonoscopy or flexible sigmoidoscopy) you may notice spotting of blood in your stool or on the toilet paper. If you underwent a bowel prep for your procedure, you may not have a normal bowel movement for a few days.  Please Note:  You might notice some irritation and congestion in your nose or some drainage.  This is from the oxygen used during your procedure.  There is no need for concern and it should clear up in a day or so.  SYMPTOMS TO REPORT IMMEDIATELY:  Following upper endoscopy (EGD)  Vomiting of blood or coffee ground material  New chest pain or pain under the shoulder blades  Painful or persistently difficult swallowing  New shortness of breath  Fever of 100F or higher  Black, tarry-looking stools  For urgent or emergent issues, a gastroenterologist can be reached at any hour by calling [phone]. Do not use MyChart messaging for urgent concerns.    DIET:  We do recommend a small meal at first, but then you may proceed to your  regular diet.  Drink plenty of fluids but you should avoid alcoholic beverages for 24 hours.  ACTIVITY:  You should plan to take it easy for the rest of today and you should NOT DRIVE or use heavy machinery until tomorrow (because of the sedation medicines used during the test).    FOLLOW UP: Our staff will call the number listed on your records the next business day following your procedure.  We will call around 7:15- 8:00 am to check on you and address any questions or concerns that you may have regarding the information given to you following your procedure. If we do not reach you, we will leave a message.     If any biopsies were taken you will be contacted by phone or by letter within the next 1-3 weeks.  Please call us at [phone] if you have not heard about the biopsies in 3 weeks.    SIGNATURES/CONFIDENTIALITY: You and/or your care partner have signed paperwork which will be entered into your electronic medical record.  These signatures attest to the fact that that the information above on your After Visit Summary has been reviewed and is understood.  Full responsibility of the confidentiality of this discharge information lies with you and/or your care-partner.

## 2022-09-06 NOTE — Progress Notes (Signed)
GASTROENTEROLOGY PROCEDURE H&P NOTE   Primary Care Physician: Kennith Maes, PA-C    Reason for Procedure:  Nausea, mild dysphagia and postprandial epigastric pain  Plan:    EGD and possible dilation  Patient is appropriate for endoscopic procedure(s) in the ambulatory ([facility]) setting.  The nature of the procedure, as well as the risks, benefits, and alternatives were carefully and thoroughly reviewed with the patient. Ample time for discussion and questions allowed. The patient understood, was satisfied, and agreed to proceed.     HPI: [name] is a 57 y.o. female who presents for EGD.  Medical history as below.  No recent chest pain or shortness of breath.  No abdominal pain today.  Past Medical History:  Diagnosis Date   Anxiety    Chronic headaches    Common bile duct stone    Depression    Gall stones    Gastritis    GERD (gastroesophageal reflux disease)    H. pylori infection    Hiatal hernia    Migraines    Plantar fasciitis, bilateral    Schatzki's ring    Sleep apnea    SVT (supraventricular tachycardia)     Past Surgical History:  Procedure Laterality Date   ABLATION  02/2021   CHOLECYSTECTOMY  06/09/2011   Procedure: LAPAROSCOPIC CHOLECYSTECTOMY;  Surgeon: Liz Malady, MD;  Location: [facility] OR;  Service: General;  Laterality: N/A;   DILATION AND CURETTAGE OF UTERUS     ERCP  06/08/2011   Procedure: ENDOSCOPIC RETROGRADE CHOLANGIOPANCREATOGRAPHY (ERCP);  Surgeon: Theda Belfast, MD;  Location: [facility] ENDOSCOPY;  Service: Endoscopy;  Laterality: N/A;   TONSILLECTOMY      Prior to Admission medications   Medication Sig Start Date End Date Taking? Authorizing Provider  aspirin EC 325 MG tablet Take 325 mg by mouth once. 03/08/22  Yes [provider]  diltiazem (CARDIZEM LA) 120 MG 24 hr tablet Take 120 mg by mouth daily. 03/02/22  Yes [provider]  linaclotide Karlene Einstein) 145 MCG CAPS capsule Take 1 capsule (145 mcg total) by mouth  daily before breakfast. 06/23/22  Yes Doree Albee, PA-C  Multiple Vitamin (MULTIVITAMIN ADULT PO) Take by mouth.   Yes [provider]  ondansetron (ZOFRAN-ODT) 8 MG disintegrating tablet Take 1 tablet (8 mg total) by mouth every 8 (eight) hours as needed for nausea or vomiting. 01/31/22  Yes Quentin Mulling R, PA-C  pantoprazole (PROTONIX) 40 MG tablet Take 1 tablet (40 mg total) by mouth daily. 06/23/22  Yes Danis, Andreas Blower, MD  acidophilus (RISAQUAD) CAPS capsule Take by mouth daily.    [provider]  BERBERINE CHLORIDE PO Take by mouth.    [provider]  Sodium Sulfate-Mag Sulfate-KCl (SUTAB) [phone] MG TABS Take 12 tablets by mouth as directed. Patient not taking: Reported on 09/06/2022 08/28/22   Beverley Fiedler, MD    Current Outpatient Medications  Medication Sig Dispense Refill   aspirin EC 325 MG tablet Take 325 mg by mouth once.     diltiazem (CARDIZEM LA) 120 MG 24 hr tablet Take 120 mg by mouth daily.     linaclotide (LINZESS) 145 MCG CAPS capsule Take 1 capsule (145 mcg total) by mouth daily before breakfast. 90 capsule 0   Multiple Vitamin (MULTIVITAMIN ADULT PO) Take by mouth.     ondansetron (ZOFRAN-ODT) 8 MG disintegrating tablet Take 1 tablet (8 mg total) by mouth every 8 (eight) hours as needed for nausea or vomiting. 90 tablet  2   pantoprazole (PROTONIX) 40 MG tablet Take 1 tablet (40 mg total) by mouth daily. 90 tablet 1   acidophilus (RISAQUAD) CAPS capsule Take by mouth daily.     BERBERINE CHLORIDE PO Take by mouth.     Sodium Sulfate-Mag Sulfate-KCl (SUTAB) [phone] MG TABS Take 12 tablets by mouth as directed. (Patient not taking: Reported on 09/06/2022) 24 tablet 0   Current Facility-Administered Medications  Medication Dose Route Frequency Provider Last Rate Last Admin   0.9 %  sodium chloride infusion  500 mL Intravenous Once Jushua Waltman, Carie Caddy, MD        Allergies as of 09/06/2022 - Review Complete 09/06/2022  Allergen  Reaction Noted   Bactrim [sulfamethoxazole-trimethoprim] Rash and Other (See Comments) 11/04/2015   Sulfa antibiotics Rash 07/30/2019    Family History  Problem Relation Age of Onset   Diabetes Mother    Heart disease Mother        AMI   Heart failure Father    Heart disease Father        CHF with defibrillator.   Hypertension Sister    Lung cancer Maternal Uncle    Bone cancer Maternal Uncle    Heart disease Maternal Grandmother 50   Heart disease Maternal Grandfather 15   Polycystic ovary syndrome Daughter    Colon cancer Neg Hx    Esophageal cancer Neg Hx    Pancreatic cancer Neg Hx    Liver disease Neg Hx    Stomach cancer Neg Hx    Colon polyps Neg Hx    Rectal cancer Neg Hx     Social History   Socioeconomic History   Marital status: Legally Separated    Spouse name: Loraine Leriche   Number of children: Not on file   Years of education: 12th   Highest education level: Not on file  Occupational History   Occupation: Cook/Attendant    Employer: [employer], [employer]   Occupation: Waitress    Comment: [employer]  Tobacco Use   Smoking status: Never   Smokeless tobacco: Never  Vaping Use   Vaping Use: Never used  Substance and Sexual Activity   Alcohol use: Yes    Comment: occasionally, 1/month   Drug use: Not Currently    Types: Marijuana    Comment: daily   Sexual activity: Yes    Birth control/protection: None    Comment: no menstruation in "years."  Other Topics Concern   Not on file  Social History Narrative   Marital status: Married      Children: 3 children      Lives: with daughter and husband      Employment: works at [employer] in [place]      Tobacco; none      Alcohol:  Socially; weekends.      Drugs: none     Exercise: none   Social Determinants of Health   Financial Resource Strain: Not on file  Food Insecurity: Not on file  Transportation Needs: Not on file  Physical Activity: Not on file  Stress: Not on file  Social Connections: Not on file  Intimate  Partner Violence: Not on file    Physical Exam: Vital signs in last 24 hours: @BP  130/83   Pulse 97   Temp 97.7 F (36.5 C)   Ht 5\' 2"  (1.575 m)   Wt 145 lb (65.8 kg)   SpO2 98%   BMI 26.52 kg/m  GEN: NAD EYE: Sclerae anicteric ENT: MMM CV: Non-tachycardic Pulm: CTA b/l GI:  Soft, NT/ND NEURO:  Alert & Oriented x 3   Erick Blinks, MD  Gastroenterology  09/06/2022 10:30 AM

## 2022-09-07 ENCOUNTER — Telehealth: Payer: Self-pay

## 2022-09-07 NOTE — Telephone Encounter (Signed)
  Follow up Call-     09/06/2022    9:58 AM 04/05/2020    8:12 AM  Call back number  Post procedure Call Back phone  # [phone]  Permission to leave phone message Yes Yes     Patient questions:  Do you have a fever, pain , or abdominal swelling? No. Pain Score  0 *  Have you tolerated food without any problems? Yes.    Have you been able to return to your normal activities? Yes.    Do you have any questions about your discharge instructions: Diet   No. Medications  No. Follow up visit  No.  Do you have questions or concerns about your Care? No.  Actions: * If pain score is 4 or above: No action needed, pain <4.

## 2022-09-11 ENCOUNTER — Encounter: Payer: Self-pay | Admitting: Internal Medicine

## 2022-10-26 ENCOUNTER — Other Ambulatory Visit: Payer: Self-pay | Admitting: Physician Assistant

## 2022-10-26 DIAGNOSIS — R112 Nausea with vomiting, unspecified: Secondary | ICD-10-CM

## 2023-01-23 ENCOUNTER — Other Ambulatory Visit: Payer: Self-pay | Admitting: Physician Assistant

## 2023-01-23 DIAGNOSIS — R112 Nausea with vomiting, unspecified: Secondary | ICD-10-CM

## 2023-03-21 ENCOUNTER — Other Ambulatory Visit: Payer: Self-pay | Admitting: Gastroenterology

## 2023-03-21 DIAGNOSIS — R112 Nausea with vomiting, unspecified: Secondary | ICD-10-CM

## 2023-03-21 DIAGNOSIS — K2970 Gastritis, unspecified, without bleeding: Secondary | ICD-10-CM

## 2023-03-21 NOTE — Telephone Encounter (Signed)
Refills request on your patient came to my inbox.  - HD

## 2023-03-31 ENCOUNTER — Other Ambulatory Visit: Payer: Self-pay | Admitting: Physician Assistant

## 2023-03-31 DIAGNOSIS — R112 Nausea with vomiting, unspecified: Secondary | ICD-10-CM

## 2023-04-18 ENCOUNTER — Ambulatory Visit: Payer: Medicaid Other | Admitting: Internal Medicine

## 2023-05-08 ENCOUNTER — Ambulatory Visit (INDEPENDENT_AMBULATORY_CARE_PROVIDER_SITE_OTHER): Payer: Medicaid Other | Admitting: Internal Medicine

## 2023-05-08 ENCOUNTER — Encounter: Payer: Self-pay | Admitting: Internal Medicine

## 2023-05-08 VITALS — BP 110/80 | HR 82 | Ht 62.0 in | Wt 165.0 lb

## 2023-05-08 DIAGNOSIS — K5904 Chronic idiopathic constipation: Secondary | ICD-10-CM

## 2023-05-08 DIAGNOSIS — K219 Gastro-esophageal reflux disease without esophagitis: Secondary | ICD-10-CM | POA: Diagnosis not present

## 2023-05-08 DIAGNOSIS — F1291 Cannabis use, unspecified, in remission: Secondary | ICD-10-CM

## 2023-05-08 DIAGNOSIS — K449 Diaphragmatic hernia without obstruction or gangrene: Secondary | ICD-10-CM

## 2023-05-08 DIAGNOSIS — K3184 Gastroparesis: Secondary | ICD-10-CM | POA: Diagnosis not present

## 2023-05-08 DIAGNOSIS — K2970 Gastritis, unspecified, without bleeding: Secondary | ICD-10-CM

## 2023-05-08 DIAGNOSIS — R112 Nausea with vomiting, unspecified: Secondary | ICD-10-CM

## 2023-05-08 DIAGNOSIS — R140 Abdominal distension (gaseous): Secondary | ICD-10-CM

## 2023-05-08 DIAGNOSIS — R1310 Dysphagia, unspecified: Secondary | ICD-10-CM

## 2023-05-08 MED ORDER — PANTOPRAZOLE SODIUM 40 MG PO TBEC
40.0000 mg | DELAYED_RELEASE_TABLET | Freq: Every day | ORAL | 3 refills | Status: AC
Start: 2023-05-08 — End: ?

## 2023-05-08 MED ORDER — ONDANSETRON 8 MG PO TBDP: ORAL_TABLET | ORAL | 3 refills | Status: DC

## 2023-05-08 MED ORDER — PRUCALOPRIDE SUCCINATE 2 MG PO TABS: 2.0000 mg | ORAL_TABLET | Freq: Every day | ORAL | 5 refills | Status: DC

## 2023-05-08 NOTE — Patient Instructions (Signed)
 Discontinue Linzess.  We have sent the following medications to your pharmacy for you to pick up at your convenience: pantoprazole, zofran and Motegrity. Samples have been given in the office today of Motegrity.  _______________________________________________________  If your blood pressure at your visit was 140/90 or greater, please contact your primary care physician to follow up on this.  _______________________________________________________  If you are age 34 or older, your body mass index should be between 23-30. Your Body mass index is 30.18 kg/m. If this is out of the aforementioned range listed, please consider follow up with your Primary Care Provider.  If you are age 34 or younger, your body mass index should be between 19-25. Your Body mass index is 30.18 kg/m. If this is out of the aformentioned range listed, please consider follow up with your Primary Care Provider.   ________________________________________________________  The [facility] providers would like to encourage you to use [facility] to communicate with providers for non-urgent requests or questions.  Due to long hold times on the telephone, sending your provider a message by [facility] may be a faster and more efficient way to get a response.  Please allow 48 business hours for a response.  Please remember that this is for non-urgent requests.  _______________________________________________________

## 2023-05-09 ENCOUNTER — Encounter: Payer: Self-pay | Admitting: Internal Medicine

## 2023-05-09 NOTE — Progress Notes (Signed)
 Subjective:    Patient ID: [name], female    DOB: Apr 12, 1965, 58 y.o.   MRN: [id number]  HPI [name] is a 58 year old with GERD, gastroparesis, hiatal hernia, and chronic constipation who presents for follow-up.  She experiences occasional dysphagia, likely related to a hiatal hernia and Schatzki's ring, which was previously dilated. She continues to take pantoprazole for management. Pain under her right rib cage is associated with her hiatal hernia. She occasionally feels pressure and difficulty swallowing solids, although her swallowing improved after her last endoscopy.  She feels that the dilation was definitively helpful.  She has chronic gastroparesis with symptoms of nausea, bloating, and early satiety. Nausea occurs before and after meals, and she sometimes wakes up with stomach pain.   She has a history of chronic constipation and has been on increasing doses of Linzess, currently at 290 mcg. She uses over-the-counter medications such as Senna, magnesium citrate, and Dulcolax soft chews to manage her symptoms. Miralax was ineffective. Recently, her stools are darker, but not black or tarry, and her Cologuard test was negative last summer.  Persistent nausea occurs sometimes before or after eating. She has been off marijuana for over a year. Zofran 8 mg helps with nausea, and she sometimes takes two doses a day.  She has gained weight, which she attributes to menopause, and her periods have stopped for years. She is unhappy with her weight gain, noting a 30-pound increase since April. She experiences seasonal depression, which affects her ability to exercise, although she enjoys being outdoors in the summer.  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 She enjoys her job and the supportive work environment.  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  Review of Systems As per HPI, otherwise negative  Current  Medications, Allergies, Past Medical History, Past Surgical History, Family History and Social History were reviewed in [employer] record.     Objective:   Physical Exam BP 110/80   Pulse 82   Ht 5\' 2"  (1.575 m)   Wt 165 lb (74.8 kg)   BMI 30.18 kg/m  Gen: awake, alert, NAD HEENT: anicteric  Neuro: nonfocal  DIAGNOSTIC EGD: Low grade narrowing, Schatzki's ring, dilated to 20 mm without change; 3 cm hiatal hernia; patchy inflammation in the gastric cardia and fundus (09/06/2022) Cologuard: Negative (09/11/2022)  PATHOLOGY Gastric biopsy: No specific pathology; no H. pylori (09/06/2022)      Assessment & Plan:   Gastroparesis Chronic gastroparesis despite no formal gastric emptying study with persistent symptoms.  Gastroparesis diet important.  Continue to avoid marijuana.  Motegrity proposed for motility improvement. - Discontinue Linzess. - Initiate Motegrity 2 mg once daily. - Consider gastric emptying scan if symptoms persist after bowel regulation.  Chronic Idiopathic Constipation Chronic constipation with ineffective Linzess.  No help with over-the-counter MiraLAX, Dulcolax, senna.  Motegrity expected to improve bowel movements. - Discontinue Linzess. - Initiate Motegrity 2 mg once daily. - Monitor bowel movement frequency and completeness.  Gastroesophageal Reflux Disease (GERD) Chronic GERD managed with pantoprazole. Occasional dysphagia possibly due to hiatal hernia. Previous endoscopy showed Schatzky's ring. - Continue pantoprazole at current dose - Consider repeat endoscopy if dysphagia worsens.  Hiatal Hernia 3 cm hiatal hernia possibly contributing to dysphagia and subcostal pain. - Monitor symptoms and consider further evaluation if pain or dysphagia persist.  Follow-up Transitioning to new insurance, affecting medication coverage. Currently on Medicaid. - Initiate Motegrity  prescription and ensure coverage with new insurance. -  Provide refills for pantoprazole and Zofran. - Schedule follow-up to assess response to Motegrity and consider gastric emptying scan if needed.

## 2023-05-13 ENCOUNTER — Encounter: Payer: Self-pay | Admitting: Internal Medicine

## 2023-05-15 ENCOUNTER — Telehealth: Payer: Self-pay

## 2023-05-15 ENCOUNTER — Telehealth: Payer: Self-pay | Admitting: Internal Medicine

## 2023-05-15 NOTE — Telephone Encounter (Signed)
Patient called states the Motegrity medication requires PA.

## 2023-05-15 NOTE — Telephone Encounter (Signed)
 Pharmacy Patient Advocate Encounter   Received notification from CoverMyMeds that prior authorization for Motegrity 2 mg tablets is required/requested.   Insurance verification completed.   The patient is insured through [facility] .   Per test claim: PA required; PA started via CoverMyMeds. KEY [id number] . Waiting for clinical questions to populate.

## 2023-05-16 ENCOUNTER — Other Ambulatory Visit (HOSPITAL_COMMUNITY): Payer: Self-pay

## 2023-05-16 ENCOUNTER — Telehealth: Payer: Self-pay

## 2023-05-16 NOTE — Telephone Encounter (Signed)
 Pharmacy Patient Advocate Encounter   Received notification from Pt Calls Messages that prior authorization for Prucalopride Succinate 2MG  tablets is required/requested.   Insurance verification completed.   The patient is insured through [facility] .  KEY [id number]   Prior Authorization form/request asks a question that requires your assistance. Please see the question below and advise accordingly. The PA will not be submitted until the necessary information is received.

## 2023-05-16 NOTE — Telephone Encounter (Signed)
 Dr Rhea Belton,   Please see Motegrity denial. Patient must have tried and failed both Amitiza and Linzess. Per chart records, we are only able to see where patient has tried Miralax and Linzess in the past..Marland Kitchen

## 2023-05-16 NOTE — Telephone Encounter (Signed)
 See 05/16/23 prior auth encounter for additional details.

## 2023-05-20 NOTE — Telephone Encounter (Signed)
 [name] patient can try Amitiza 8 mcg twice daily.  Tell her that this only needs to be tried for short period of time and if ineffective she can let us know and at that point we can retry Motegrity prescription

## 2023-05-21 MED ORDER — LUBIPROSTONE 8 MCG PO CAPS: 8.0000 ug | ORAL_CAPSULE | Freq: Two times a day (BID) | ORAL | 0 refills | Status: DC

## 2023-05-21 NOTE — Telephone Encounter (Signed)
 Spoke to patient to advise of insurance requirement to try Amitiza 8 mcg twice daily rx before they will approve motegrity. Patient states that so far, motegrity has not worked for her anyways (has taken a 2 week sample supply). She will try Amitiza and if not effective, will make [place] aware for additional recommendations. Amitiza Rx sent to pharmacy.

## 2023-05-21 NOTE — Addendum Note (Signed)
 Addended by: Richardson Chiquito on: 05/21/2023 09:07 AM   Modules accepted: Orders

## 2023-07-17 DIAGNOSIS — R002 Palpitations: Secondary | ICD-10-CM | POA: Diagnosis not present

## 2023-07-19 DIAGNOSIS — R9431 Abnormal electrocardiogram [ECG] [EKG]: Secondary | ICD-10-CM | POA: Diagnosis not present

## 2023-07-31 DIAGNOSIS — G4733 Obstructive sleep apnea (adult) (pediatric): Secondary | ICD-10-CM | POA: Diagnosis not present

## 2023-08-06 ENCOUNTER — Other Ambulatory Visit: Payer: Self-pay | Admitting: Internal Medicine

## 2023-08-06 ENCOUNTER — Encounter: Payer: Self-pay | Admitting: Internal Medicine

## 2023-08-06 DIAGNOSIS — K5909 Other constipation: Secondary | ICD-10-CM

## 2023-08-07 NOTE — Telephone Encounter (Signed)
 Ok for refill?

## 2023-08-09 MED ORDER — LINACLOTIDE 290 MCG PO CAPS: 290.0000 ug | ORAL_CAPSULE | Freq: Every day | ORAL | 2 refills | Status: DC

## 2023-08-09 NOTE — Addendum Note (Signed)
 Addended by: Glennette Lanius on: 08/09/2023 08:25 AM   Modules accepted: Orders

## 2023-08-14 ENCOUNTER — Encounter: Payer: Self-pay | Admitting: Internal Medicine

## 2023-08-14 ENCOUNTER — Other Ambulatory Visit: Payer: Self-pay

## 2023-08-14 DIAGNOSIS — M5136 Other intervertebral disc degeneration, lumbar region with discogenic back pain only: Secondary | ICD-10-CM | POA: Diagnosis not present

## 2023-08-14 DIAGNOSIS — E66811 Obesity, class 1: Secondary | ICD-10-CM | POA: Diagnosis not present

## 2023-08-14 DIAGNOSIS — M25561 Pain in right knee: Secondary | ICD-10-CM | POA: Diagnosis not present

## 2023-08-14 DIAGNOSIS — Z713 Dietary counseling and surveillance: Secondary | ICD-10-CM | POA: Diagnosis not present

## 2023-08-14 MED ORDER — LINACLOTIDE 290 MCG PO CAPS: 290.0000 ug | ORAL_CAPSULE | Freq: Every day | ORAL | 1 refills | Status: AC

## 2023-08-16 DIAGNOSIS — F321 Major depressive disorder, single episode, moderate: Secondary | ICD-10-CM | POA: Diagnosis not present

## 2023-08-16 DIAGNOSIS — E66811 Obesity, class 1: Secondary | ICD-10-CM | POA: Diagnosis not present

## 2023-08-16 DIAGNOSIS — F411 Generalized anxiety disorder: Secondary | ICD-10-CM | POA: Diagnosis not present

## 2023-08-18 DIAGNOSIS — Z7409 Other reduced mobility: Secondary | ICD-10-CM | POA: Diagnosis not present

## 2023-08-18 DIAGNOSIS — M6289 Other specified disorders of muscle: Secondary | ICD-10-CM | POA: Diagnosis not present

## 2023-08-18 DIAGNOSIS — R29898 Other symptoms and signs involving the musculoskeletal system: Secondary | ICD-10-CM | POA: Diagnosis not present

## 2023-08-18 DIAGNOSIS — M5442 Lumbago with sciatica, left side: Secondary | ICD-10-CM | POA: Diagnosis not present

## 2023-08-26 DIAGNOSIS — Z8719 Personal history of other diseases of the digestive system: Secondary | ICD-10-CM | POA: Diagnosis not present

## 2023-08-26 DIAGNOSIS — Z79899 Other long term (current) drug therapy: Secondary | ICD-10-CM | POA: Diagnosis not present

## 2023-08-26 DIAGNOSIS — R1013 Epigastric pain: Secondary | ICD-10-CM | POA: Diagnosis not present

## 2023-08-26 DIAGNOSIS — R935 Abnormal findings on diagnostic imaging of other abdominal regions, including retroperitoneum: Secondary | ICD-10-CM | POA: Diagnosis not present

## 2023-08-26 DIAGNOSIS — I2510 Atherosclerotic heart disease of native coronary artery without angina pectoris: Secondary | ICD-10-CM | POA: Diagnosis not present

## 2023-08-26 DIAGNOSIS — K5909 Other constipation: Secondary | ICD-10-CM | POA: Diagnosis not present

## 2023-08-26 DIAGNOSIS — R110 Nausea: Secondary | ICD-10-CM | POA: Diagnosis not present

## 2023-08-26 DIAGNOSIS — R079 Chest pain, unspecified: Secondary | ICD-10-CM | POA: Diagnosis not present

## 2023-08-26 DIAGNOSIS — R0609 Other forms of dyspnea: Secondary | ICD-10-CM | POA: Diagnosis not present

## 2023-08-26 DIAGNOSIS — R0789 Other chest pain: Secondary | ICD-10-CM | POA: Diagnosis not present

## 2023-08-26 DIAGNOSIS — R002 Palpitations: Secondary | ICD-10-CM | POA: Diagnosis not present

## 2023-08-26 DIAGNOSIS — R918 Other nonspecific abnormal finding of lung field: Secondary | ICD-10-CM | POA: Diagnosis not present

## 2023-08-26 DIAGNOSIS — R1010 Upper abdominal pain, unspecified: Secondary | ICD-10-CM | POA: Diagnosis not present

## 2023-08-26 DIAGNOSIS — J189 Pneumonia, unspecified organism: Secondary | ICD-10-CM | POA: Diagnosis not present

## 2023-08-27 DIAGNOSIS — I2510 Atherosclerotic heart disease of native coronary artery without angina pectoris: Secondary | ICD-10-CM | POA: Diagnosis not present

## 2023-08-27 DIAGNOSIS — R079 Chest pain, unspecified: Secondary | ICD-10-CM | POA: Diagnosis not present

## 2023-08-27 DIAGNOSIS — J189 Pneumonia, unspecified organism: Secondary | ICD-10-CM | POA: Diagnosis not present

## 2023-08-28 DIAGNOSIS — F411 Generalized anxiety disorder: Secondary | ICD-10-CM | POA: Diagnosis not present

## 2023-08-28 DIAGNOSIS — F321 Major depressive disorder, single episode, moderate: Secondary | ICD-10-CM | POA: Diagnosis not present

## 2023-09-10 ENCOUNTER — Ambulatory Visit (HOSPITAL_COMMUNITY): Admission: EM | Admit: 2023-09-10 | Discharge: 2023-09-10 | Disposition: A | Discharge status: Home / Self Care

## 2023-09-10 ENCOUNTER — Encounter (HOSPITAL_COMMUNITY): Payer: Self-pay | Admitting: Emergency Medicine

## 2023-09-10 DIAGNOSIS — M545 Low back pain, unspecified: Secondary | ICD-10-CM

## 2023-09-10 DIAGNOSIS — M51369 Other intervertebral disc degeneration, lumbar region without mention of lumbar back pain or lower extremity pain: Secondary | ICD-10-CM

## 2023-09-10 MED ORDER — METHOCARBAMOL 500 MG PO TABS: 500.0000 mg | ORAL_TABLET | Freq: Two times a day (BID) | ORAL | 0 refills | Status: DC

## 2023-09-10 MED ORDER — DEXAMETHASONE SODIUM PHOSPHATE 10 MG/ML IJ SOLN
INTRAMUSCULAR | Status: AC
Start: 2023-09-10 — End: 2023-09-10
  Filled 2023-09-10: qty 1

## 2023-09-10 MED ORDER — DEXAMETHASONE SODIUM PHOSPHATE 10 MG/ML IJ SOLN
10.0000 mg | Freq: Once | INTRAMUSCULAR | Status: AC
  Administered 2023-09-10: 10 mg via INTRAMUSCULAR

## 2023-09-10 NOTE — ED Triage Notes (Signed)
 Pt reports right lower back pain that started yesterday after got [place] the [place] park. Denies any lifting or injury. Hasn't tried any measures for pain. Denies urinary or bowel problems.

## 2023-09-10 NOTE — ED Provider Notes (Signed)
 [facility]    CSN: [id number] Arrival date & time: 09/10/23  1221      History   Chief Complaint Chief Complaint  Patient presents with   Back Pain    HPI [name] is a 58 y.o. female.   Patient presents to clinic for concern of right-sided lower back pain that started yesterday after getting off a float in a lazy river at a water park.  Back pain started abruptly.  Did not have any trauma or injuries during the lazy river experience.  Has not tried any medications or interventions for pain.  Did notice that last night while laying in bed had pain while changing positions.  Does have a history of degenerative disc disease in her lumbar spine.  We usually have left-sided low back pain.  Has not had any underlying numbness, incontinence or tingling.  Denies radiation of pain.  Thought pain might be better today but it is not, so she came into clinic.   The history is provided by the patient and medical records.  Back Pain   Past Medical History:  Diagnosis Date   Anxiety    Chronic headaches    Common bile duct stone    Depression    Gall stones    Gastritis    GERD (gastroesophageal reflux disease)    H. pylori infection    Hiatal hernia    Migraines    Plantar fasciitis, bilateral    Schatzki's ring    Sleep apnea    SVT (supraventricular tachycardia) (HCC)     Patient Active Problem List   Diagnosis Date Noted   GERD (gastroesophageal reflux disease) 09/27/2020   History of Helicobacter pylori infection 08/26/2018   History of duodenal ulcer 08/26/2018   Family history of diabetes mellitus (DM) 08/05/2018   History of nephrolithiasis 07/09/2018   Hematuria 07/09/2018   Dysfunction of eustachian tube 03/29/2018   Bruxism 03/29/2018   Breast pain, right 07/09/2017   Bacterial infection due to H. pylori 03/19/2017   Nausea 02/07/2017   OSA (obstructive sleep apnea) 05/29/2016   Hypersomnia 04/06/2016   Lumbar degenerative disc disease  05/07/2015   Other mixed anxiety disorders 08/28/2014   S/P laparoscopic cholecystectomy 07/05/2011   CERVICAL STRAIN 05/19/2009   Allergic rhinitis 06/22/2008   Migraine 06/22/2008   DEPRESSION, MAJOR, MODERATE 06/15/2008   PARESTHESIA 06/15/2008   PULMONARY NODULE 04/17/2007   MENORRHAGIA 07/02/2006   NOCTURIA 04/26/2006    Past Surgical History:  Procedure Laterality Date   ABLATION  02/2021   CHOLECYSTECTOMY  06/09/2011   Procedure: LAPAROSCOPIC CHOLECYSTECTOMY;  Surgeon: Dann FORBES Hummer, MD;  Location: [facility] OR;  Service: General;  Laterality: N/A;   DILATION AND CURETTAGE OF UTERUS     ERCP  06/08/2011   Procedure: ENDOSCOPIC RETROGRADE CHOLANGIOPANCREATOGRAPHY (ERCP);  Surgeon: Belvie JONETTA Just, MD;  [facility] ENDOSCOPY;  Service: Endoscopy;  Laterality: N/A;   TONSILLECTOMY      OB History   No obstetric history on file.      Home Medications    Prior to Admission medications   Medication Sig Start Date End Date Taking? Authorizing Provider  methocarbamol  (ROBAXIN ) 500 MG tablet Take 1 tablet (500 mg total) by mouth 2 (two) times daily. 09/10/23  Yes [place] Luppino  N, FNP  aspirin EC 325 MG tablet Take 325 mg by mouth once. 03/08/22   [provider]  diltiazem (CARDIZEM CD) 180 MG 24 hr capsule Take 180 mg by mouth daily.  [provider]  diltiazem (CARDIZEM LA) 120 MG 24 hr tablet Take 120 mg by mouth daily. 03/02/22   [provider]  linaclotide  (LINZESS ) 290 MCG CAPS capsule Take 1 capsule (290 mcg total) by mouth daily before breakfast. 08/14/23   Pyrtle, Gordy HERO, MD  lubiprostone  (AMITIZA ) 8 MCG capsule Take 1 capsule (8 mcg total) by mouth 2 (two) times daily with a meal. 05/21/23   Pyrtle, Gordy HERO, MD  Multiple Vitamin (MULTIVITAMIN ADULT PO) Take by mouth.    [provider]  ondansetron  (ZOFRAN -ODT) 8 MG disintegrating tablet DISSOLVE 1 TABLET IN MOUTH EVERY 8 HOURS AS NEEDED FOR NAUSEA OR VOMITING 05/08/23   Pyrtle, Gordy HERO, MD   pantoprazole  (PROTONIX ) 40 MG tablet Take 1 tablet (40 mg total) by mouth daily. 05/08/23   Pyrtle, Gordy HERO, MD  WEGOVY 0.25 MG/0.5ML SOAJ SMARTSIG:0.5 Milliliter(s) SUB-Q Once a Week    [provider]    Family History Family History  Problem Relation Age of Onset   Diabetes Mother    Heart disease Mother        AMI   Heart failure Father    Heart disease Father        CHF with defibrillator.   Hypertension Sister    Lung cancer Maternal Uncle    Bone cancer Maternal Uncle    Heart disease Maternal Grandmother 50   Heart disease Maternal Grandfather 18   Polycystic ovary syndrome Daughter    Colon cancer Neg Hx    Esophageal cancer Neg Hx    Pancreatic cancer Neg Hx    Liver disease Neg Hx    Stomach cancer Neg Hx    Colon polyps Neg Hx    Rectal cancer Neg Hx     Social History Social History   Tobacco Use   Smoking status: Never   Smokeless tobacco: Never  Vaping Use   Vaping status: Never Used  Substance Use Topics   Alcohol use: Yes    Comment: occasionally, 1/month   Drug use: Not Currently    Types: Marijuana    Comment: daily     Allergies   Bactrim  [sulfamethoxazole -trimethoprim ] and Sulfa  antibiotics   Review of Systems Review of Systems  Per HPI  Physical Exam Triage Vital Signs ED Triage Vitals  Encounter Vitals Group     BP 09/10/23 1311 101/64     Girls Systolic BP Percentile --      Girls Diastolic BP Percentile --      Boys Systolic BP Percentile --      Boys Diastolic BP Percentile --      Pulse Rate 09/10/23 1311 89     Resp 09/10/23 1311 17     Temp 09/10/23 1311 97.9 F (36.6 C)     Temp Source 09/10/23 1311 Oral     SpO2 09/10/23 1311 97 %     Weight --      Height --      Head Circumference --      Peak Flow --      Pain Score 09/10/23 1309 6     Pain Loc --      Pain Education --      Exclude from Growth Chart --    No data found.  Updated Vital Signs BP 101/64 (BP Location: Left Arm)   Pulse 89   Temp  97.9 F (36.6 C) (Oral)   Resp 17   SpO2 97%   Visual Acuity Right Eye Distance:  Left Eye Distance:   Bilateral Distance:    Right Eye Near:   Left Eye Near:    Bilateral Near:     Physical Exam Vitals and nursing note reviewed.  Constitutional:      Appearance: Normal appearance.  HENT:     Head: Normocephalic and atraumatic.     Right Ear: External ear normal.     Left Ear: External ear normal.     Nose: Nose normal.     Mouth/Throat:     Mouth: Mucous membranes are moist.  Eyes:     Conjunctiva/sclera: Conjunctivae normal.  Cardiovascular:     Rate and Rhythm: Normal rate.  Pulmonary:     Effort: Pulmonary effort is normal. No respiratory distress.  Musculoskeletal:        General: Tenderness present. No swelling or signs of injury.     Lumbar back: Tenderness present. Positive right straight leg raise test.       Back:  Skin:    General: Skin is warm and dry.  Neurological:     General: No focal deficit present.     Mental Status: She is alert and oriented to person, place, and time.  Psychiatric:        Mood and Affect: Mood normal.        Behavior: Behavior normal. Behavior is cooperative.      UC Treatments / Results  Labs (all labs ordered are listed, but only abnormal results are displayed) Labs Reviewed  POCT URINALYSIS DIP (MANUAL ENTRY)    EKG   Radiology No results found.  Procedures Procedures (including critical care time)  Medications Ordered in UC Medications  dexamethasone  (DECADRON ) injection 10 mg (10 mg Intramuscular Given 09/10/23 1351)    Initial Impression / Assessment and Plan / UC Course  I have reviewed the triage vital signs and the nursing notes.  Pertinent labs & imaging results that were available during my care of the patient were reviewed by me and considered in my medical decision making (see chart for details).  Vitals and triage reviewed, patient is hemodynamically stable.  Right sided lumbar back pain,  mild tenderness to palpation.  Positive right-sided straight leg raise.  Atraumatic, imaging deferred.  Known DDD of lumbar spine.  Without inner leg numbness or concern for cauda equina.  Will treat with steroid and muscle relaxer for inflammation.  Plan of care, follow-up care return precautions given, no questions at this time.     Final Clinical Impressions(s) / UC Diagnoses   Final diagnoses:  Right-sided low back pain without sciatica, unspecified chronicity  Degeneration of intervertebral disc of lumbar region, unspecified whether pain present     Discharge Instructions      The steroid should help reduce inflammation and pain.  Take the muscle relaxer up to 2 times daily as needed to help reduce stiff muscles.  Do not drink alcohol or drive on this medication as it may cause drowsiness.  Warm compresses, gentle stretching and follow-up with an orthopedic for further evaluation if back pain persist.    ED Prescriptions     Medication Sig Dispense Auth. Provider   methocarbamol  (ROBAXIN ) 500 MG tablet Take 1 tablet (500 mg total) by mouth 2 (two) times daily. 20 tablet Dreama, Hykeem Ojeda  N, FNP      I have reviewed the PDMP during this encounter.   Dreama, Kymber Kosar  N, FNP 09/10/23 1355

## 2023-09-10 NOTE — Discharge Instructions (Signed)
 The steroid should help reduce inflammation and pain.  Take the muscle relaxer up to 2 times daily as needed to help reduce stiff muscles.  Do not drink alcohol or drive on this medication as it may cause drowsiness.  Warm compresses, gentle stretching and follow-up with an orthopedic for further evaluation if back pain persist.

## 2023-09-11 ENCOUNTER — Other Ambulatory Visit (HOSPITAL_COMMUNITY): Payer: Self-pay

## 2023-09-11 ENCOUNTER — Telehealth: Payer: Self-pay

## 2023-09-11 DIAGNOSIS — F321 Major depressive disorder, single episode, moderate: Secondary | ICD-10-CM | POA: Diagnosis not present

## 2023-09-11 DIAGNOSIS — F411 Generalized anxiety disorder: Secondary | ICD-10-CM | POA: Diagnosis not present

## 2023-09-11 NOTE — Telephone Encounter (Signed)
 Pharmacy Patient Advocate Encounter   Received notification from Patient Pharmacy that prior authorization for Linzess  290mcg capsule is required/requested.   Insurance verification completed.   The patient is insured through [facility] .   Per test claim: Refill too soon. PA is not needed at this time. Medication was filled #[phone] on 06-08-2023 and #30 on 08-14-2023. Next eligible fill date is 10-02-2023.

## 2023-09-25 DIAGNOSIS — Z9889 Other specified postprocedural states: Secondary | ICD-10-CM | POA: Diagnosis not present

## 2023-09-25 DIAGNOSIS — K3184 Gastroparesis: Secondary | ICD-10-CM | POA: Diagnosis not present

## 2023-09-25 DIAGNOSIS — I471 Supraventricular tachycardia, unspecified: Secondary | ICD-10-CM | POA: Diagnosis not present

## 2023-09-25 DIAGNOSIS — I456 Pre-excitation syndrome: Secondary | ICD-10-CM | POA: Diagnosis not present

## 2023-09-26 DIAGNOSIS — R079 Chest pain, unspecified: Secondary | ICD-10-CM | POA: Diagnosis not present

## 2023-10-03 DIAGNOSIS — R309 Painful micturition, unspecified: Secondary | ICD-10-CM | POA: Diagnosis not present

## 2023-10-08 DIAGNOSIS — R079 Chest pain, unspecified: Secondary | ICD-10-CM | POA: Diagnosis not present

## 2023-10-25 DIAGNOSIS — E66811 Obesity, class 1: Secondary | ICD-10-CM | POA: Diagnosis not present

## 2023-10-25 DIAGNOSIS — Z713 Dietary counseling and surveillance: Secondary | ICD-10-CM | POA: Diagnosis not present

## 2023-10-25 DIAGNOSIS — Z7982 Long term (current) use of aspirin: Secondary | ICD-10-CM | POA: Diagnosis not present

## 2023-10-25 DIAGNOSIS — Z6830 Body mass index (BMI) 30.0-30.9, adult: Secondary | ICD-10-CM | POA: Diagnosis not present

## 2023-10-25 DIAGNOSIS — Z79899 Other long term (current) drug therapy: Secondary | ICD-10-CM | POA: Diagnosis not present

## 2023-10-30 DIAGNOSIS — F321 Major depressive disorder, single episode, moderate: Secondary | ICD-10-CM | POA: Diagnosis not present

## 2023-10-30 DIAGNOSIS — F411 Generalized anxiety disorder: Secondary | ICD-10-CM | POA: Diagnosis not present

## 2023-11-13 ENCOUNTER — Encounter: Payer: Self-pay | Admitting: Nurse Practitioner

## 2023-11-13 ENCOUNTER — Ambulatory Visit: Admitting: Nurse Practitioner

## 2023-11-13 ENCOUNTER — Other Ambulatory Visit (INDEPENDENT_AMBULATORY_CARE_PROVIDER_SITE_OTHER)

## 2023-11-13 VITALS — BP 120/80 | HR 84 | Ht 62.0 in | Wt 166.2 lb

## 2023-11-13 DIAGNOSIS — K449 Diaphragmatic hernia without obstruction or gangrene: Secondary | ICD-10-CM | POA: Diagnosis not present

## 2023-11-13 DIAGNOSIS — K219 Gastro-esophageal reflux disease without esophagitis: Secondary | ICD-10-CM | POA: Diagnosis not present

## 2023-11-13 DIAGNOSIS — R6881 Early satiety: Secondary | ICD-10-CM

## 2023-11-13 DIAGNOSIS — R110 Nausea: Secondary | ICD-10-CM

## 2023-11-13 DIAGNOSIS — K5904 Chronic idiopathic constipation: Secondary | ICD-10-CM

## 2023-11-13 DIAGNOSIS — R1010 Upper abdominal pain, unspecified: Secondary | ICD-10-CM | POA: Diagnosis not present

## 2023-11-13 DIAGNOSIS — R1110 Vomiting, unspecified: Secondary | ICD-10-CM

## 2023-11-13 DIAGNOSIS — Z8711 Personal history of peptic ulcer disease: Secondary | ICD-10-CM

## 2023-11-13 DIAGNOSIS — R112 Nausea with vomiting, unspecified: Secondary | ICD-10-CM | POA: Diagnosis not present

## 2023-11-13 LAB — COMPREHENSIVE METABOLIC PANEL WITH GFR
ALT: 17 U/L (ref 0–35)
AST: 18 U/L (ref 0–37)
Albumin: 4.2 g/dL (ref 3.5–5.2)
Alkaline Phosphatase: 81 U/L (ref 39–117)
BUN: 10 mg/dL (ref 6–23)
CO2: 28 meq/L (ref 19–32)
Calcium: 10.6 mg/dL — ABNORMAL HIGH (ref 8.4–10.5)
Chloride: 106 meq/L (ref 96–112)
Creatinine, Ser: 0.69 mg/dL (ref 0.40–1.20)
GFR: 97.06 mL/min (ref >60.00)
Glucose, Bld: 86 mg/dL (ref 70–99)
Potassium: 4 meq/L (ref 3.5–5.1)
Sodium: 142 meq/L (ref 135–145)
Total Bilirubin: 0.4 mg/dL (ref 0.2–1.2)
Total Protein: 7.3 g/dL (ref 6.0–8.3)

## 2023-11-13 LAB — CBC WITH DIFFERENTIAL/PLATELET
Basophils Absolute: 0.1 K/uL (ref 0.0–0.1)
Basophils Relative: 1.2 % (ref 0.0–3.0)
Eosinophils Absolute: 1 K/uL — ABNORMAL HIGH (ref 0.0–0.7)
Eosinophils Relative: 10.2 % — ABNORMAL HIGH (ref 0.0–5.0)
HCT: 45.4 % (ref 36.0–46.0)
Hemoglobin: 15 g/dL (ref 12.0–15.0)
Lymphocytes Relative: 28.3 % (ref 12.0–46.0)
Lymphs Abs: 2.7 K/uL (ref 0.7–4.0)
MCHC: 33.1 g/dL (ref 30.0–36.0)
MCV: 91.3 fl (ref 78.0–100.0)
Monocytes Absolute: 0.6 K/uL (ref 0.1–1.0)
Monocytes Relative: 6.6 % (ref 3.0–12.0)
Neutro Abs: 5.1 K/uL (ref 1.4–7.7)
Neutrophils Relative %: 53.7 % (ref 43.0–77.0)
Platelets: 289 K/uL (ref 150.0–400.0)
RBC: 4.97 Mil/uL (ref 3.87–5.11)
RDW: 13.9 % (ref 11.5–15.5)
WBC: 9.5 K/uL (ref 4.0–10.5)

## 2023-11-13 LAB — LIPASE: Lipase: 40 U/L (ref 11.0–59.0)

## 2023-11-13 MED ORDER — FAMOTIDINE 20 MG PO TABS: 20.0000 mg | ORAL_TABLET | Freq: Every day | ORAL | 1 refills | Status: AC

## 2023-11-13 NOTE — Progress Notes (Signed)
 11/13/2023 [name] [id number] 18-Aug-1965   Chief Complaint: Nausea, vomiting and epigastrici pain   History of Present Illness:  with a past medical history of SVT, GERD, duodenal ulcers, positive H. pylori IgG antibody in 2018, hiatal hernia and gastroparesis.  Past cholecystectomy 2012. She presents today for further evaluation regarding nausea x 6 months and on and off upper abdominal pain. She was last seen by Dr. Albertus in office 05/08/2023, at that time she endorsed having occasional dysphagia likely secondary to a hiatal hernia and Schatzki's ring which was previously dilated. She also noted having chronic gastroparesis symptoms with nausea, bloat and early satiety. Nausea somewhat improved after taking Ondansetron . Constipation was not well controlled on Linzess  290 mcg every day so she was switched to Motegrity  2mg  daily which potentially would improve her gastric motility. She took Motegrity  samples x 2 weeks without improvement and this medications was not covered by her insurance carrier. A gastric empty study was considered if her gastroparesis symptoms persisted. She presents today for further GI follow up. She is in tears due to persistent nausea and upper abdominal pain. She awakens most mornings feeling nauseous. She vomited partially digested peanut butter and jelly sandwich she ate the prior evening which occurred 2 weeks ago. She has upper abdominal pain which occurs a few days weekly and lasts 5 to 10 minutes. On Pantoprazole  40mg  once daily. Her most recent EGD was 08/2022 which showed a Schatzki's ring which was dilated, a 3 cm hiatal hernia and mild gastritis. She remains on Linzess  which results in passing a BM most days, sometimes skips 2 to 3 days then takes OTC magnesium citrate of Dulcolax. Miralax  was ineffective. No bloody or black stools. Never had a colonoscopy, Cologuard test 08/2022 was negative. She has been on Wegovy x 3 months. On ASA 325mg  once daily.  Nonsmoker. She drinks 3 mixed drinks or less per month.      Latest Ref Rng & Units 01/30/2022    4:08 PM 09/29/2020   10:03 AM 07/30/2019   11:06 AM  CBC  WBC 4.0 - 10.5 K/uL 8.9  7.9  8.6   Hemoglobin 12.0 - 15.0 g/dL 84.6  85.1  85.3   Hematocrit 36.0 - 46.0 % 45.2  43.8  43.7   Platelets 150.0 - 400.0 K/uL 253.0  249.0  232        Latest Ref Rng & Units 01/30/2022    4:08 PM 09/29/2020   10:03 AM 07/30/2019   11:06 AM  CMP  Glucose 70 - 99 mg/dL 89  82  80   BUN 6 - 23 mg/dL 8  7  8    Creatinine 0.40 - 1.20 mg/dL 9.34  9.36  9.35   Sodium 135 - 145 mEq/L 139  141  141   Potassium 3.5 - 5.1 mEq/L 4.6  4.0  3.3   Chloride 96 - 112 mEq/L 105  104  103   CO2 19 - 32 mEq/L 30  29  27    Calcium 8.4 - 10.5 mg/dL 89.6  89.8  89.9   Total Protein 6.0 - 8.3 g/dL 7.3  7.2  7.0   Total Bilirubin 0.2 - 1.2 mg/dL 0.4  0.3  0.8   Alkaline Phos 39 - 117 U/L 72  69  52   AST 0 - 37 U/L 13  13  20    ALT 0 - 35 U/L 10  8  17      Cologuard Test 09/11/2022: Negative  CTAP 02/06/2022: FINDINGS: Lower Chest: 5 mm subpleural pulmonary nodule seen in the anterolateral right lower lobe on image 5/4.   Hepatobiliary: No hepatic masses identified. Prior cholecystectomy. No evidence of biliary obstruction.   Pancreas:  No mass or inflammatory changes.   Spleen: Within normal limits in size and appearance.   Adrenals/Urinary Tract: No suspicious masses identified. No evidence of ureteral calculi or hydronephrosis. Urinary bladder is nearly completely empty.   Stomach/Bowel: No evidence of obstruction, inflammatory process or abnormal fluid collections.   Vascular/Lymphatic: No pathologically enlarged lymph nodes. No acute vascular findings. Aortic atherosclerotic calcification incidentally noted.   Reproductive:  No mass or other significant abnormality.   Other:  None.   Musculoskeletal:  No suspicious bone lesions identified.   IMPRESSION: No acute findings within the abdomen or  pelvis.   5 mm right lower lobe pulmonary nodule. No follow-up needed if patient is low-risk. Non-contrast chest CT can be considered in 12 months if patient is high-risk.  Aortic Atherosclerosis     GI PROCEDURES:  EGD 09/06/2022: - Low-grade of narrowing Schatzki ring. Dilated to 20 mm with balloon and ring disruption with cold forceps.  - 3 cm hiatal hernia.  - Mild gastritis. Biopsied to exclude H. Pylori given nausea and epigastric pain.  - Normal examined duodenum. - OXYNTIC MUCOSA WITH NO SIGNIFICANT PATHOLOGY. - NO HELICOBACTER PYLORI ORGANISMS IDENTIFIED ON H&E STAINED SLIDE.  EGD 04/05/2020: - Non-obstructing Schatzki ring. - 3 cm hiatal hernia. - A large amount of food (residue) in the stomach. This is consistent with gastroparesis and likely explains recent symptoms. - Gastritis. Biopsied. - Normal examined duodenum. Surgical [P], gastric - GASTRIC ANTRAL AND OXYNTIC MUCOSA WITH MILD CHRONIC GASTRITIS. - INTESTINAL METAPLASIA IS PRESENT FOCALLY IN ANTRUM, NEGATIVE FOR DYSPLASIA. - WARTHIN-STARRY STAIN IS NEGATIVE FOR HELICOBACTER PYLORI.   EGD 04/06/2017: - Normal mucosa was found in the entire esophagus. - 2 cm hiatal hernia. - Gastritis. Biopsied. - Duodenal ulcers with duodenitis. - Normal second portion of the duodenum. - CHRONIC GASTRITIS. - NEGATIVE FOR HELICOBACTER PYLORI. - NO INTESTINAL METAPLASIA, DYSPLASIA, OR MALIGNANCY.   Current Outpatient Medications on File Prior to Visit  Medication Sig Dispense Refill   aspirin EC 325 MG tablet Take 325 mg by mouth once.     diltiazem (CARDIZEM LA) 120 MG 24 hr tablet Take 120 mg by mouth daily.     linaclotide  (LINZESS ) 290 MCG CAPS capsule Take 1 capsule (290 mcg total) by mouth daily before breakfast. 90 capsule 1   Multiple Vitamin (MULTIVITAMIN ADULT PO) Take by mouth.     ondansetron  (ZOFRAN -ODT) 8 MG disintegrating tablet DISSOLVE 1 TABLET IN MOUTH EVERY 8 HOURS AS NEEDED FOR NAUSEA OR VOMITING 90 tablet  3   pantoprazole  (PROTONIX ) 40 MG tablet Take 1 tablet (40 mg total) by mouth daily. 90 tablet 3   semaglutide-weight management (WEGOVY) 1 MG/0.5ML SOAJ SQ injection Inject 1 mg into the skin once a week.     Current Facility-Administered Medications on File Prior to Visit  Medication Dose Route Frequency Provider Last Rate Last Admin   0.9 %  sodium chloride  infusion  500 mL Intravenous Once Pyrtle, Gordy HERO, MD       Allergies  Allergen Reactions   Bactrim  [Sulfamethoxazole -Trimethoprim ] Rash and Other (See Comments)    Pt states she developed a rash on arms, throat soreness, headache and ear pain.    Sulfa  Antibiotics Rash   Current Medications, Allergies, Past Medical History, Past Surgical History, Family History  and Social History were reviewed in [employer] record.  Review of Systems:   Constitutional: Negative for fever, sweats, chills or weight loss.  Respiratory: Negative for shortness of breath.   Cardiovascular: Negative for chest pain, palpitations and leg swelling.  Gastrointestinal: See HPI.  Musculoskeletal: Negative for back pain or muscle aches.  Neurological: Negative for dizziness, headaches or paresthesias.   Physical Exam: There were no vitals taken for this visit. BP 120/80 (BP Location: Left Arm, Patient Position: Sitting, Cuff Size: Normal)   Pulse 84   Ht 5' 2 (1.575 m) Comment: height measured without shoes  Wt 166 lb 4 oz (75.4 kg)   BMI 30.41 kg/m   Wt Readings from Last 3 Encounters:  11/13/23 166 lb 4 oz (75.4 kg)  05/08/23 165 lb (74.8 kg)  09/06/22 145 lb (65.8 kg)    General: 58 year old in no acute distress. Head: Normocephalic and atraumatic. Eyes: No scleral icterus. Conjunctiva pink . Ears: Normal auditory acuity. Mouth: Dentition intact. No ulcers or lesions.  Lungs: Clear throughout to auscultation. Heart: Regular rate and rhythm, no murmur. Abdomen: Soft, nontender and nondistended. No masses or hepatomegaly.  Normal bowel sounds x 4 quadrants.  Rectal: Deferred.  Musculoskeletal: Symmetrical with no gross deformities. Extremities: No edema. Neurological: Alert oriented x 4. No focal deficits.  Psychological: Alert and cooperative. Normal mood and affect  Assessment and Recommendations:  58 year old female with chronic nausea, recent episode of vomiting. Suspect gastroparesis. Prior EGD2/2022 showed retained food in the stomach, suggestive of gastroparesis.  - Gastric empty study - Patient instructed to hold Wegovy 1 week prior to gastric empty study  - CBC, CMP and lipase  - Recommended eating 4 small snack sized meals daily  - Ondansetron   GERD, hiatal hernia and prior history of a duodenal ulcer 03/2017 with epigastric pain. EGD 08/2022 showed a Schatzki's ring which was dilated, a 3 cmm hiatal hernia and gastritis without evidence of H. Pylori.  - See plan above  - Continue Pantoprazole  40mg  every day - Add Famotidine  20mg  QHS  Chronic constipation. On Linzess , results in passing a BM daily, sometimes goes 2 to 3 days without passing a BM then takes Mag Citrate or Dulcolax - Continue Linzess  290 mcg once daily   Colon cancer screening. Negative Cologuard test 08/2022. - Recommend repeat Cologuard test in 3 years verses conventional colonoscopy   Anxiety/depression  - Continue follow up with therapist

## 2023-11-13 NOTE — Patient Instructions (Signed)
 Your provider has requested that you go to the basement level for lab work before leaving today. Press B on the elevator. The lab is located at the first door on the left as you exit the elevator.  You have been scheduled for a gastric emptying scan at [facility] on 11/27/23 at 7:30 am . Please arrive at least 15  minutes prior to your appointment for registration. Please make certain not to have anything to eat or drink after midnight the night before your test. Hold all stomach medications (ex: Zofran , phenergan , Reglan ) 24 hours prior to your test. If you need to reschedule your appointment, please contact radiology scheduling at [phone]. HOLD your [phone] for 1 week prior to GES.  _________________________________ ____________________________________ A gastric-emptying study measures how long it takes for food to move through your stomach. There are several ways to measure stomach emptying. In the most common test, you eat food that contains a small amount of radioactive material. A scanner that detects the movement of the radioactive material is placed over your abdomen to monitor the rate at which food leaves your stomach. This test normally takes about 4 hours to complete. _____________________________________________________________________  3-4 small snack sizes meals daily.   We have sent the following medications to your pharmacy for you to pick up at your convenience: Famotidine  20 mg - 1 tablet at bedtime.   _______________________________________________________  If your blood pressure at your visit was 140/90 or greater, please contact your primary care physician to follow up on this.  _______________________________________________________  If you are age 80 or older, your body mass index should be between 23-30. Your Body mass index is 30.41 kg/m. If this is out of the aforementioned range listed, please consider follow up with your Primary Care Provider.  If you  are age 10 or younger, your body mass index should be between 19-25. Your Body mass index is 30.41 kg/m. If this is out of the aformentioned range listed, please consider follow up with your Primary Care Provider.   ________________________________________________________  The [facility] providers would like to encourage you to use MYCHART to communicate with providers for non-urgent requests or questions.  Due to long hold times on the telephone, sending your provider a message by [facility] may be a faster and more efficient way to get a response.  Please allow 48 business hours for a response.  Please remember that this is for non-urgent requests.  _______________________________________________________  [facility] is using a team-based approach to care.  Your team is made up of your doctor and two to three APPS. Our APPS (Nurse Practitioners and Physician Assistants) work with your physician to ensure care continuity for you. They are fully qualified to address your health concerns and develop a treatment plan. They communicate directly with your gastroenterologist to care for you. Seeing the Advanced Practice Practitioners on your physician's team can help you by facilitating care more promptly, often allowing for earlier appointments, access to diagnostic testing, procedures, and other specialty referrals.   Thank you for trusting me with your gastrointestinal care!   Elida Shawl, CRNP

## 2023-11-14 ENCOUNTER — Ambulatory Visit: Payer: Self-pay | Admitting: Nurse Practitioner

## 2023-11-16 ENCOUNTER — Other Ambulatory Visit: Payer: Self-pay

## 2023-11-16 DIAGNOSIS — R898 Other abnormal findings in specimens from other organs, systems and tissues: Secondary | ICD-10-CM

## 2023-11-27 ENCOUNTER — Encounter (HOSPITAL_COMMUNITY)
Admission: RE | Admit: 2023-11-27 | Discharge: 2023-11-27 | Disposition: A | Discharge status: Home / Self Care | Source: Ambulatory Visit | Attending: Nurse Practitioner | Admitting: Nurse Practitioner

## 2023-11-27 DIAGNOSIS — R6881 Early satiety: Secondary | ICD-10-CM | POA: Diagnosis not present

## 2023-11-27 DIAGNOSIS — R110 Nausea: Secondary | ICD-10-CM | POA: Diagnosis not present

## 2023-11-27 DIAGNOSIS — R400 Somnolence: Secondary | ICD-10-CM | POA: Diagnosis not present

## 2023-11-27 DIAGNOSIS — R1010 Upper abdominal pain, unspecified: Secondary | ICD-10-CM | POA: Diagnosis not present

## 2023-11-27 DIAGNOSIS — R1110 Vomiting, unspecified: Secondary | ICD-10-CM | POA: Diagnosis not present

## 2023-11-27 MED ORDER — TECHNETIUM TC 99M SULFUR COLLOID
2.1000 | Freq: Once | INTRAVENOUS | Status: AC
  Administered 2023-11-27: 2.1 via ORAL

## 2023-12-04 DIAGNOSIS — F411 Generalized anxiety disorder: Secondary | ICD-10-CM | POA: Diagnosis not present

## 2023-12-04 DIAGNOSIS — F321 Major depressive disorder, single episode, moderate: Secondary | ICD-10-CM | POA: Diagnosis not present

## 2023-12-05 DIAGNOSIS — R059 Cough, unspecified: Secondary | ICD-10-CM | POA: Diagnosis not present

## 2023-12-05 DIAGNOSIS — H8112 Benign paroxysmal vertigo, left ear: Secondary | ICD-10-CM | POA: Diagnosis not present

## 2023-12-05 DIAGNOSIS — R42 Dizziness and giddiness: Secondary | ICD-10-CM | POA: Diagnosis not present

## 2023-12-24 DIAGNOSIS — N390 Urinary tract infection, site not specified: Secondary | ICD-10-CM | POA: Diagnosis not present

## 2024-03-06 ENCOUNTER — Other Ambulatory Visit: Payer: Self-pay | Admitting: Internal Medicine

## 2024-03-06 DIAGNOSIS — R112 Nausea with vomiting, unspecified: Secondary | ICD-10-CM
